# Patient Record
Sex: Female | Born: 1940 | Race: Black or African American | Hispanic: No | Marital: Single | Smoking: Never smoker
Health system: Southern US, Community
[De-identification: ages and names within clinical notes are randomized; demographics above are authoritative.]

## PROBLEM LIST (undated history)

## (undated) DIAGNOSIS — IMO0002 Reserved for concepts with insufficient information to code with codable children: Secondary | ICD-10-CM

## (undated) DIAGNOSIS — M1990 Unspecified osteoarthritis, unspecified site: Secondary | ICD-10-CM

## (undated) DIAGNOSIS — K219 Gastro-esophageal reflux disease without esophagitis: Secondary | ICD-10-CM

## (undated) DIAGNOSIS — K635 Polyp of colon: Secondary | ICD-10-CM

## (undated) DIAGNOSIS — F418 Other specified anxiety disorders: Secondary | ICD-10-CM

## (undated) DIAGNOSIS — I10 Essential (primary) hypertension: Secondary | ICD-10-CM

## (undated) DIAGNOSIS — D649 Anemia, unspecified: Secondary | ICD-10-CM

## (undated) DIAGNOSIS — E559 Vitamin D deficiency, unspecified: Secondary | ICD-10-CM

## (undated) DIAGNOSIS — F419 Anxiety disorder, unspecified: Secondary | ICD-10-CM

## (undated) DIAGNOSIS — F329 Major depressive disorder, single episode, unspecified: Secondary | ICD-10-CM

## (undated) DIAGNOSIS — F32A Depression, unspecified: Secondary | ICD-10-CM

## (undated) DIAGNOSIS — T7840XA Allergy, unspecified, initial encounter: Secondary | ICD-10-CM

## (undated) DIAGNOSIS — E785 Hyperlipidemia, unspecified: Secondary | ICD-10-CM

## (undated) DIAGNOSIS — D369 Benign neoplasm, unspecified site: Secondary | ICD-10-CM

## (undated) HISTORY — DX: Anemia, unspecified: D64.9

## (undated) HISTORY — DX: Unspecified osteoarthritis, unspecified site: M19.90

## (undated) HISTORY — DX: Reserved for concepts with insufficient information to code with codable children: IMO0002

## (undated) HISTORY — PX: OTHER SURGICAL HISTORY: SHX169

## (undated) HISTORY — DX: Allergy, unspecified, initial encounter: T78.40XA

## (undated) HISTORY — DX: Depression, unspecified: F32.A

## (undated) HISTORY — DX: Major depressive disorder, single episode, unspecified: F32.9

## (undated) HISTORY — DX: Polyp of colon: K63.5

## (undated) HISTORY — DX: Hyperlipidemia, unspecified: E78.5

## (undated) HISTORY — DX: Vitamin D deficiency, unspecified: E55.9

## (undated) HISTORY — DX: Benign neoplasm, unspecified site: D36.9

## (undated) HISTORY — DX: Essential (primary) hypertension: I10

## (undated) HISTORY — DX: Anxiety disorder, unspecified: F41.9

## (undated) HISTORY — DX: Gastro-esophageal reflux disease without esophagitis: K21.9

## (undated) HISTORY — DX: Other specified anxiety disorders: F41.8

---

## 1993-02-20 HISTORY — PX: ABDOMINAL HYSTERECTOMY: SHX81

## 1997-10-15 ENCOUNTER — Emergency Department (HOSPITAL_COMMUNITY): Admission: EM | Admit: 1997-10-15 | Discharge: 1997-10-15 | Payer: Self-pay | Admitting: Unknown Physician Specialty

## 1997-12-23 ENCOUNTER — Encounter: Payer: Self-pay | Admitting: Emergency Medicine

## 1997-12-23 ENCOUNTER — Emergency Department (HOSPITAL_COMMUNITY): Admission: EM | Admit: 1997-12-23 | Discharge: 1997-12-23 | Payer: Self-pay | Admitting: Emergency Medicine

## 1999-02-08 ENCOUNTER — Encounter: Payer: Self-pay | Admitting: Emergency Medicine

## 1999-02-08 ENCOUNTER — Inpatient Hospital Stay: Admission: EM | Admit: 1999-02-08 | Discharge: 1999-02-10 | Payer: Self-pay | Admitting: Emergency Medicine

## 2000-02-13 ENCOUNTER — Emergency Department (HOSPITAL_COMMUNITY): Admission: EM | Admit: 2000-02-13 | Discharge: 2000-02-14 | Payer: Self-pay | Admitting: Emergency Medicine

## 2000-04-05 ENCOUNTER — Other Ambulatory Visit: Admission: RE | Admit: 2000-04-05 | Discharge: 2000-04-05 | Payer: Self-pay | Admitting: *Deleted

## 2000-04-16 ENCOUNTER — Ambulatory Visit (HOSPITAL_COMMUNITY): Admission: RE | Admit: 2000-04-16 | Discharge: 2000-04-16 | Payer: Self-pay | Admitting: *Deleted

## 2000-04-16 ENCOUNTER — Encounter (INDEPENDENT_AMBULATORY_CARE_PROVIDER_SITE_OTHER): Payer: Self-pay | Admitting: *Deleted

## 2000-05-02 ENCOUNTER — Ambulatory Visit (HOSPITAL_COMMUNITY): Admission: RE | Admit: 2000-05-02 | Discharge: 2000-05-02 | Payer: Self-pay | Admitting: *Deleted

## 2000-05-02 ENCOUNTER — Encounter: Payer: Self-pay | Admitting: *Deleted

## 2000-05-14 ENCOUNTER — Encounter: Payer: Self-pay | Admitting: *Deleted

## 2000-05-14 ENCOUNTER — Ambulatory Visit (HOSPITAL_COMMUNITY): Admission: RE | Admit: 2000-05-14 | Discharge: 2000-05-14 | Payer: Self-pay | Admitting: *Deleted

## 2000-11-07 ENCOUNTER — Emergency Department (HOSPITAL_COMMUNITY)
Admission: EM | Admit: 2000-11-07 | Discharge: 2000-11-07 | Payer: Self-pay | Admitting: Thoracic Surgery (Cardiothoracic Vascular Surgery)

## 2001-03-07 ENCOUNTER — Encounter: Payer: Self-pay | Admitting: Emergency Medicine

## 2001-03-07 ENCOUNTER — Emergency Department (HOSPITAL_COMMUNITY): Admission: EM | Admit: 2001-03-07 | Discharge: 2001-03-07 | Payer: Self-pay | Admitting: Emergency Medicine

## 2001-08-27 ENCOUNTER — Emergency Department (HOSPITAL_COMMUNITY): Admission: EM | Admit: 2001-08-27 | Discharge: 2001-08-27 | Payer: Self-pay | Admitting: Emergency Medicine

## 2001-08-27 ENCOUNTER — Encounter: Payer: Self-pay | Admitting: Emergency Medicine

## 2002-09-04 ENCOUNTER — Encounter: Payer: Self-pay | Admitting: Pulmonary Disease

## 2002-09-04 ENCOUNTER — Ambulatory Visit (HOSPITAL_COMMUNITY): Admission: RE | Admit: 2002-09-04 | Discharge: 2002-09-04 | Payer: Self-pay | Admitting: Pulmonary Disease

## 2002-12-10 ENCOUNTER — Emergency Department (HOSPITAL_COMMUNITY): Admission: EM | Admit: 2002-12-10 | Discharge: 2002-12-10 | Payer: Self-pay | Admitting: Emergency Medicine

## 2003-02-27 ENCOUNTER — Ambulatory Visit (HOSPITAL_BASED_OUTPATIENT_CLINIC_OR_DEPARTMENT_OTHER): Admission: RE | Admit: 2003-02-27 | Discharge: 2003-02-27 | Payer: Self-pay | Admitting: Orthopedic Surgery

## 2003-02-27 ENCOUNTER — Ambulatory Visit (HOSPITAL_COMMUNITY): Admission: RE | Admit: 2003-02-27 | Discharge: 2003-02-27 | Payer: Self-pay | Admitting: Orthopedic Surgery

## 2003-10-21 ENCOUNTER — Ambulatory Visit (HOSPITAL_COMMUNITY): Admission: RE | Admit: 2003-10-21 | Discharge: 2003-10-21 | Payer: Self-pay | Admitting: Family Medicine

## 2003-11-16 ENCOUNTER — Ambulatory Visit: Payer: Self-pay | Admitting: *Deleted

## 2003-11-16 ENCOUNTER — Ambulatory Visit: Payer: Self-pay | Admitting: Internal Medicine

## 2003-11-19 ENCOUNTER — Ambulatory Visit (HOSPITAL_COMMUNITY): Admission: RE | Admit: 2003-11-19 | Discharge: 2003-11-19 | Payer: Self-pay | Admitting: Internal Medicine

## 2003-12-14 ENCOUNTER — Ambulatory Visit: Payer: Self-pay | Admitting: Internal Medicine

## 2003-12-14 ENCOUNTER — Ambulatory Visit: Payer: Self-pay | Admitting: Family Medicine

## 2004-02-22 ENCOUNTER — Ambulatory Visit: Payer: Self-pay | Admitting: Internal Medicine

## 2004-03-09 ENCOUNTER — Ambulatory Visit: Payer: Self-pay | Admitting: Internal Medicine

## 2004-05-05 ENCOUNTER — Ambulatory Visit: Payer: Self-pay | Admitting: Internal Medicine

## 2004-05-16 ENCOUNTER — Emergency Department (HOSPITAL_COMMUNITY): Admission: EM | Admit: 2004-05-16 | Discharge: 2004-05-16 | Payer: Self-pay | Admitting: Emergency Medicine

## 2004-05-19 ENCOUNTER — Ambulatory Visit (HOSPITAL_COMMUNITY): Admission: RE | Admit: 2004-05-19 | Discharge: 2004-05-19 | Payer: Self-pay | Admitting: Internal Medicine

## 2004-07-26 ENCOUNTER — Ambulatory Visit: Payer: Self-pay | Admitting: Internal Medicine

## 2004-08-02 ENCOUNTER — Ambulatory Visit: Payer: Self-pay | Admitting: Internal Medicine

## 2004-08-16 ENCOUNTER — Ambulatory Visit: Payer: Self-pay | Admitting: Internal Medicine

## 2004-08-30 ENCOUNTER — Ambulatory Visit: Payer: Self-pay | Admitting: Internal Medicine

## 2004-10-14 ENCOUNTER — Ambulatory Visit: Payer: Self-pay | Admitting: Internal Medicine

## 2004-10-29 ENCOUNTER — Observation Stay (HOSPITAL_COMMUNITY): Admission: EM | Admit: 2004-10-29 | Discharge: 2004-10-30 | Payer: Self-pay | Admitting: Emergency Medicine

## 2005-01-25 ENCOUNTER — Encounter (INDEPENDENT_AMBULATORY_CARE_PROVIDER_SITE_OTHER): Payer: Self-pay | Admitting: Specialist

## 2005-01-25 ENCOUNTER — Encounter: Admission: RE | Admit: 2005-01-25 | Discharge: 2005-01-25 | Payer: Self-pay | Admitting: Psychiatry

## 2005-06-30 ENCOUNTER — Ambulatory Visit: Payer: Self-pay | Admitting: Internal Medicine

## 2005-07-18 ENCOUNTER — Ambulatory Visit: Payer: Self-pay | Admitting: Internal Medicine

## 2005-09-04 ENCOUNTER — Emergency Department (HOSPITAL_COMMUNITY): Admission: EM | Admit: 2005-09-04 | Discharge: 2005-09-04 | Payer: Self-pay | Admitting: Family Medicine

## 2005-09-15 ENCOUNTER — Emergency Department (HOSPITAL_COMMUNITY): Admission: EM | Admit: 2005-09-15 | Discharge: 2005-09-15 | Payer: Self-pay | Admitting: Emergency Medicine

## 2005-09-27 ENCOUNTER — Ambulatory Visit: Payer: Self-pay | Admitting: Internal Medicine

## 2005-10-04 ENCOUNTER — Ambulatory Visit: Payer: Self-pay | Admitting: Internal Medicine

## 2005-10-04 ENCOUNTER — Inpatient Hospital Stay (HOSPITAL_COMMUNITY): Admission: AD | Admit: 2005-10-04 | Discharge: 2005-10-06 | Payer: Self-pay | Admitting: Internal Medicine

## 2005-10-04 ENCOUNTER — Encounter: Admission: RE | Admit: 2005-10-04 | Discharge: 2005-10-04 | Payer: Self-pay | Admitting: Internal Medicine

## 2005-10-04 ENCOUNTER — Ambulatory Visit (HOSPITAL_COMMUNITY): Admission: RE | Admit: 2005-10-04 | Discharge: 2005-10-04 | Payer: Self-pay | Admitting: Internal Medicine

## 2005-10-04 ENCOUNTER — Encounter (INDEPENDENT_AMBULATORY_CARE_PROVIDER_SITE_OTHER): Payer: Self-pay | Admitting: *Deleted

## 2005-10-04 LAB — CONVERTED CEMR LAB: Urinalysis: NORMAL

## 2006-02-06 ENCOUNTER — Encounter (INDEPENDENT_AMBULATORY_CARE_PROVIDER_SITE_OTHER): Payer: Self-pay | Admitting: *Deleted

## 2006-02-06 ENCOUNTER — Ambulatory Visit: Payer: Self-pay | Admitting: *Deleted

## 2006-02-06 DIAGNOSIS — E785 Hyperlipidemia, unspecified: Secondary | ICD-10-CM | POA: Insufficient documentation

## 2006-02-06 DIAGNOSIS — I10 Essential (primary) hypertension: Secondary | ICD-10-CM | POA: Insufficient documentation

## 2006-02-06 DIAGNOSIS — F410 Panic disorder [episodic paroxysmal anxiety] without agoraphobia: Secondary | ICD-10-CM | POA: Insufficient documentation

## 2006-02-06 LAB — CONVERTED CEMR LAB
BUN: 19 mg/dL (ref 6–23)
CO2: 23 meq/L (ref 19–32)
Calcium: 8.9 mg/dL (ref 8.4–10.5)
Chloride: 107 meq/L (ref 96–112)
Cholesterol: 203 mg/dL — ABNORMAL HIGH (ref 0–200)
Creatinine, Ser: 0.82 mg/dL (ref 0.40–1.20)
Glucose, Bld: 96 mg/dL (ref 70–99)
HDL: 58 mg/dL (ref >39)
LDL Cholesterol: 122 mg/dL — ABNORMAL HIGH (ref 0–99)
Potassium: 3.8 meq/L (ref 3.5–5.3)
Sodium: 141 meq/L (ref 135–145)
Total CHOL/HDL Ratio: 3.5
Triglycerides: 113 mg/dL (ref <150)
VLDL: 23 mg/dL (ref 0–40)

## 2006-02-09 ENCOUNTER — Encounter (INDEPENDENT_AMBULATORY_CARE_PROVIDER_SITE_OTHER): Payer: Self-pay | Admitting: *Deleted

## 2006-02-09 ENCOUNTER — Ambulatory Visit: Payer: Self-pay | Admitting: Internal Medicine

## 2006-02-09 LAB — CONVERTED CEMR LAB
BUN: 14 mg/dL (ref 6–23)
CO2: 27 meq/L (ref 19–32)
Calcium: 8.8 mg/dL (ref 8.4–10.5)
Chloride: 109 meq/L (ref 96–112)
Creatinine, Ser: 0.8 mg/dL (ref 0.40–1.20)
Glucose, Bld: 114 mg/dL — ABNORMAL HIGH (ref 70–99)
Potassium: 4.1 meq/L (ref 3.5–5.3)
Sodium: 140 meq/L (ref 135–145)

## 2006-03-09 DIAGNOSIS — M1990 Unspecified osteoarthritis, unspecified site: Secondary | ICD-10-CM | POA: Insufficient documentation

## 2006-03-09 DIAGNOSIS — K219 Gastro-esophageal reflux disease without esophagitis: Secondary | ICD-10-CM | POA: Insufficient documentation

## 2006-03-09 DIAGNOSIS — F3289 Other specified depressive episodes: Secondary | ICD-10-CM | POA: Insufficient documentation

## 2006-03-09 DIAGNOSIS — F329 Major depressive disorder, single episode, unspecified: Secondary | ICD-10-CM | POA: Insufficient documentation

## 2006-03-23 ENCOUNTER — Encounter (INDEPENDENT_AMBULATORY_CARE_PROVIDER_SITE_OTHER): Payer: Self-pay | Admitting: *Deleted

## 2006-03-23 ENCOUNTER — Ambulatory Visit: Payer: Self-pay | Admitting: Internal Medicine

## 2006-04-02 ENCOUNTER — Ambulatory Visit: Payer: Self-pay | Admitting: Hospitalist

## 2006-04-02 ENCOUNTER — Encounter (INDEPENDENT_AMBULATORY_CARE_PROVIDER_SITE_OTHER): Payer: Self-pay | Admitting: *Deleted

## 2006-04-02 DIAGNOSIS — Z9189 Other specified personal risk factors, not elsewhere classified: Secondary | ICD-10-CM | POA: Insufficient documentation

## 2006-04-03 ENCOUNTER — Telehealth (INDEPENDENT_AMBULATORY_CARE_PROVIDER_SITE_OTHER): Payer: Self-pay | Admitting: *Deleted

## 2006-04-03 LAB — CONVERTED CEMR LAB
ALT: 14 U/L (ref 0–35)
AST: 18 U/L (ref 0–37)
Albumin: 4.1 g/dL (ref 3.5–5.2)
Alkaline Phosphatase: 71 U/L (ref 39–117)
BUN: 17 mg/dL (ref 6–23)
CO2: 25 meq/L (ref 19–32)
Calcium: 9 mg/dL (ref 8.4–10.5)
Chloride: 106 meq/L (ref 96–112)
Cholesterol: 220 mg/dL — ABNORMAL HIGH (ref 0–200)
Creatinine, Ser: 0.89 mg/dL (ref 0.40–1.20)
Glucose, Bld: 98 mg/dL (ref 70–99)
HDL: 60 mg/dL (ref >39)
LDL Cholesterol: 141 mg/dL — ABNORMAL HIGH (ref 0–99)
Potassium: 4.2 meq/L (ref 3.5–5.3)
Sodium: 140 meq/L (ref 135–145)
Total Bilirubin: 0.6 mg/dL (ref 0.3–1.2)
Total CHOL/HDL Ratio: 3.7
Total Protein: 8 g/dL (ref 6.0–8.3)
Triglycerides: 94 mg/dL (ref <150)
VLDL: 19 mg/dL (ref 0–40)

## 2006-04-04 ENCOUNTER — Telehealth (INDEPENDENT_AMBULATORY_CARE_PROVIDER_SITE_OTHER): Payer: Self-pay | Admitting: *Deleted

## 2006-05-07 ENCOUNTER — Ambulatory Visit: Payer: Self-pay | Admitting: Internal Medicine

## 2006-06-22 ENCOUNTER — Ambulatory Visit: Payer: Self-pay | Admitting: *Deleted

## 2006-06-22 ENCOUNTER — Encounter (INDEPENDENT_AMBULATORY_CARE_PROVIDER_SITE_OTHER): Payer: Self-pay | Admitting: *Deleted

## 2006-06-22 LAB — CONVERTED CEMR LAB
ALT: 18 U/L (ref 0–35)
AST: 18 U/L (ref 0–37)
Albumin: 3.9 g/dL (ref 3.5–5.2)
Alkaline Phosphatase: 67 U/L (ref 39–117)
BUN: 18 mg/dL (ref 6–23)
CO2: 26 meq/L (ref 19–32)
Calcium: 8.9 mg/dL (ref 8.4–10.5)
Chloride: 108 meq/L (ref 96–112)
Cholesterol: 196 mg/dL (ref 0–200)
Creatinine, Ser: 0.93 mg/dL (ref 0.40–1.20)
Glucose, Bld: 102 mg/dL — ABNORMAL HIGH (ref 70–99)
HDL: 59 mg/dL (ref >39)
LDL Cholesterol: 122 mg/dL — ABNORMAL HIGH (ref 0–99)
Potassium: 4.2 meq/L (ref 3.5–5.3)
Sodium: 143 meq/L (ref 135–145)
Total Bilirubin: 0.5 mg/dL (ref 0.3–1.2)
Total CHOL/HDL Ratio: 3.3
Total Protein: 7.7 g/dL (ref 6.0–8.3)
Triglycerides: 76 mg/dL (ref <150)
VLDL: 15 mg/dL (ref 0–40)

## 2006-06-29 ENCOUNTER — Telehealth: Payer: Self-pay | Admitting: *Deleted

## 2006-07-12 ENCOUNTER — Ambulatory Visit: Payer: Self-pay | Admitting: Internal Medicine

## 2006-07-27 ENCOUNTER — Ambulatory Visit: Payer: Self-pay | Admitting: Internal Medicine

## 2006-07-27 DIAGNOSIS — R7301 Impaired fasting glucose: Secondary | ICD-10-CM | POA: Insufficient documentation

## 2006-08-17 ENCOUNTER — Ambulatory Visit: Payer: Self-pay | Admitting: Gastroenterology

## 2006-08-29 ENCOUNTER — Ambulatory Visit: Payer: Self-pay | Admitting: Hospitalist

## 2006-08-29 ENCOUNTER — Encounter (INDEPENDENT_AMBULATORY_CARE_PROVIDER_SITE_OTHER): Payer: Self-pay | Admitting: *Deleted

## 2006-08-29 LAB — CONVERTED CEMR LAB
ALT: 13 U/L (ref 0–35)
AST: 18 units/L (ref 0–37)
Albumin: 4.1 g/dL (ref 3.5–5.2)
Alkaline Phosphatase: 78 U/L (ref 39–117)
BUN: 21 mg/dL (ref 6–23)
CO2: 25 meq/L (ref 19–32)
Calcium: 8.8 mg/dL (ref 8.4–10.5)
Chloride: 108 meq/L (ref 96–112)
Cholesterol: 191 mg/dL (ref 0–200)
Creatinine, Ser: 0.95 mg/dL (ref 0.40–1.20)
Glucose, Bld: 103 mg/dL — ABNORMAL HIGH (ref 70–99)
HDL: 60 mg/dL (ref >39)
LDL Cholesterol: 111 mg/dL — ABNORMAL HIGH (ref 0–99)
Potassium: 4.3 meq/L (ref 3.5–5.3)
Sodium: 142 meq/L (ref 135–145)
Total Bilirubin: 0.4 mg/dL (ref 0.3–1.2)
Total CHOL/HDL Ratio: 3.2
Total Protein: 7.7 g/dL (ref 6.0–8.3)
Triglycerides: 100 mg/dL (ref <150)
VLDL: 20 mg/dL (ref 0–40)

## 2006-09-17 ENCOUNTER — Ambulatory Visit: Payer: Self-pay | Admitting: Hospitalist

## 2006-10-03 ENCOUNTER — Ambulatory Visit: Payer: Self-pay | Admitting: Gastroenterology

## 2006-10-03 ENCOUNTER — Encounter: Payer: Self-pay | Admitting: Gastroenterology

## 2006-12-13 ENCOUNTER — Telehealth (INDEPENDENT_AMBULATORY_CARE_PROVIDER_SITE_OTHER): Payer: Self-pay | Admitting: *Deleted

## 2006-12-27 ENCOUNTER — Encounter (INDEPENDENT_AMBULATORY_CARE_PROVIDER_SITE_OTHER): Payer: Self-pay | Admitting: *Deleted

## 2006-12-28 ENCOUNTER — Ambulatory Visit: Payer: Self-pay | Admitting: *Deleted

## 2007-02-01 ENCOUNTER — Ambulatory Visit: Payer: Self-pay | Admitting: Internal Medicine

## 2007-03-01 ENCOUNTER — Encounter (INDEPENDENT_AMBULATORY_CARE_PROVIDER_SITE_OTHER): Payer: Self-pay | Admitting: *Deleted

## 2007-03-01 ENCOUNTER — Ambulatory Visit: Payer: Self-pay | Admitting: Internal Medicine

## 2007-03-27 LAB — CONVERTED CEMR LAB
ALT: 14 U/L (ref 0–35)
AST: 18 U/L (ref 0–37)
Albumin: 4 g/dL (ref 3.5–5.2)
Alkaline Phosphatase: 75 units/L (ref 39–117)
BUN: 19 mg/dL (ref 6–23)
CO2: 25 meq/L (ref 19–32)
Calcium: 8.9 mg/dL (ref 8.4–10.5)
Chloride: 107 meq/L (ref 96–112)
Cholesterol: 197 mg/dL (ref 0–200)
Creatinine, Ser: 1.04 mg/dL (ref 0.40–1.20)
Glucose, Bld: 111 mg/dL — ABNORMAL HIGH (ref 70–99)
HDL: 63 mg/dL (ref >39)
LDL Cholesterol: 121 mg/dL — ABNORMAL HIGH (ref 0–99)
Potassium: 4.5 meq/L (ref 3.5–5.3)
Sodium: 141 meq/L (ref 135–145)
Total Bilirubin: 0.6 mg/dL (ref 0.3–1.2)
Total CHOL/HDL Ratio: 3.1
Total Protein: 8 g/dL (ref 6.0–8.3)
Triglycerides: 66 mg/dL (ref <150)
VLDL: 13 mg/dL (ref 0–40)

## 2007-04-08 ENCOUNTER — Ambulatory Visit: Payer: Self-pay | Admitting: Internal Medicine

## 2007-05-20 ENCOUNTER — Ambulatory Visit: Payer: Self-pay | Admitting: Internal Medicine

## 2007-05-20 ENCOUNTER — Encounter (INDEPENDENT_AMBULATORY_CARE_PROVIDER_SITE_OTHER): Payer: Self-pay | Admitting: Internal Medicine

## 2007-05-20 LAB — CONVERTED CEMR LAB
ALT: 15 U/L
AST: 18 U/L
Albumin: 4 g/dL
Alkaline Phosphatase: 77 U/L
BUN: 21 mg/dL
CO2: 22 meq/L
Calcium: 8.7 mg/dL
Chloride: 105 meq/L
Creatinine, Ser: 1.04 mg/dL
Glucose, Bld: 112 mg/dL — ABNORMAL HIGH
Potassium: 4.4 meq/L
Sodium: 140 meq/L
Total Bilirubin: 0.5 mg/dL
Total Protein: 7.9 g/dL

## 2007-09-03 ENCOUNTER — Ambulatory Visit: Payer: Self-pay | Admitting: *Deleted

## 2007-09-11 ENCOUNTER — Ambulatory Visit: Payer: Self-pay | Admitting: Infectious Diseases

## 2007-09-11 ENCOUNTER — Encounter (INDEPENDENT_AMBULATORY_CARE_PROVIDER_SITE_OTHER): Payer: Self-pay | Admitting: *Deleted

## 2007-09-11 ENCOUNTER — Encounter: Admission: RE | Admit: 2007-09-11 | Discharge: 2007-09-11 | Payer: Self-pay | Admitting: *Deleted

## 2007-09-12 LAB — CONVERTED CEMR LAB
Cholesterol: 219 mg/dL — ABNORMAL HIGH (ref 0–200)
HDL: 58 mg/dL (ref >39)
LDL Cholesterol: 146 mg/dL — ABNORMAL HIGH (ref 0–99)
Total CHOL/HDL Ratio: 3.8
Triglycerides: 73 mg/dL (ref <150)
VLDL: 15 mg/dL (ref 0–40)

## 2007-12-09 ENCOUNTER — Ambulatory Visit: Payer: Self-pay | Admitting: Internal Medicine

## 2007-12-09 ENCOUNTER — Encounter (INDEPENDENT_AMBULATORY_CARE_PROVIDER_SITE_OTHER): Payer: Self-pay | Admitting: *Deleted

## 2007-12-12 LAB — CONVERTED CEMR LAB
ALT: 17 U/L (ref 0–35)
AST: 19 U/L (ref 0–37)
Albumin: 3.9 g/dL (ref 3.5–5.2)
Alkaline Phosphatase: 80 units/L (ref 39–117)
BUN: 17 mg/dL (ref 6–23)
CO2: 23 meq/L (ref 19–32)
Calcium: 8.5 mg/dL (ref 8.4–10.5)
Chloride: 108 meq/L (ref 96–112)
Creatinine, Ser: 1 mg/dL (ref 0.40–1.20)
Glucose, Bld: 117 mg/dL — ABNORMAL HIGH (ref 70–99)
Potassium: 4.3 meq/L (ref 3.5–5.3)
Sed Rate: 31 mm/hr — ABNORMAL HIGH (ref 0–22)
Sodium: 142 meq/L (ref 135–145)
TSH: 0.792 microintl units/mL (ref 0.350–4.50)
Total Bilirubin: 0.4 mg/dL (ref 0.3–1.2)
Total CK: 387 U/L — ABNORMAL HIGH (ref 7–177)
Total Protein: 7.7 g/dL (ref 6.0–8.3)

## 2007-12-27 ENCOUNTER — Ambulatory Visit: Payer: Self-pay | Admitting: Infectious Diseases

## 2007-12-27 ENCOUNTER — Encounter (INDEPENDENT_AMBULATORY_CARE_PROVIDER_SITE_OTHER): Payer: Self-pay | Admitting: *Deleted

## 2007-12-27 LAB — CONVERTED CEMR LAB: Total CK: 392 units/L — ABNORMAL HIGH (ref 7–177)

## 2008-01-31 ENCOUNTER — Ambulatory Visit: Payer: Self-pay | Admitting: Gastroenterology

## 2008-01-31 ENCOUNTER — Encounter (INDEPENDENT_AMBULATORY_CARE_PROVIDER_SITE_OTHER): Payer: Self-pay | Admitting: *Deleted

## 2008-02-04 DIAGNOSIS — D649 Anemia, unspecified: Secondary | ICD-10-CM | POA: Insufficient documentation

## 2008-02-04 LAB — CONVERTED CEMR LAB
Basophils Absolute: 0 10*3/uL (ref 0.0–0.1)
Basophils Relative: 0.5 % (ref 0.0–3.0)
Eosinophils Absolute: 0 10*3/uL (ref 0.0–0.7)
Eosinophils Relative: 1.7 % (ref 0.0–5.0)
HCT: 35.3 % — ABNORMAL LOW (ref 36.0–46.0)
Hemoglobin: 11.9 g/dL — ABNORMAL LOW (ref 12.0–15.0)
Lymphocytes Relative: 40.9 % (ref 12.0–46.0)
MCHC: 33.7 g/dL (ref 30.0–36.0)
MCV: 95.5 fL (ref 78.0–100.0)
Monocytes Absolute: 0.1 10*3/uL (ref 0.1–1.0)
Monocytes Relative: 5.8 % (ref 3.0–12.0)
Neutro Abs: 1.3 10*3/uL — ABNORMAL LOW (ref 1.4–7.7)
Neutrophils Relative %: 51.1 % (ref 43.0–77.0)
Platelets: 199 10*3/uL (ref 150–400)
RBC: 3.7 M/uL — ABNORMAL LOW (ref 3.87–5.11)
RDW: 13.6 % (ref 11.5–14.6)
WBC: 2.4 10*3/uL — ABNORMAL LOW (ref 4.5–10.5)

## 2008-02-07 ENCOUNTER — Ambulatory Visit: Payer: Self-pay | Admitting: Internal Medicine

## 2008-02-07 ENCOUNTER — Encounter (INDEPENDENT_AMBULATORY_CARE_PROVIDER_SITE_OTHER): Payer: Self-pay | Admitting: *Deleted

## 2008-02-07 LAB — CONVERTED CEMR LAB
Basophils Absolute: 0 10*3/uL (ref 0.0–0.1)
Basophils Relative: 0 % (ref 0–1)
Eosinophils Absolute: 0 10*3/uL (ref 0.0–0.7)
Eosinophils Relative: 0 % (ref 0–5)
HCT: 35.9 % — ABNORMAL LOW (ref 36.0–46.0)
Hemoglobin: 11.9 g/dL — ABNORMAL LOW (ref 12.0–15.0)
Lymphocytes Relative: 9 % — ABNORMAL LOW (ref 12–46)
Lymphs Abs: 0.5 10*3/uL — ABNORMAL LOW (ref 0.7–4.0)
MCHC: 33.2 g/dL (ref 30.0–36.0)
MCV: 94.9 fL (ref 78.0–100.0)
Monocytes Absolute: 0.3 10*3/uL (ref 0.1–1.0)
Monocytes Relative: 5 % (ref 3–12)
Neutro Abs: 4.6 10*3/uL (ref 1.7–7.7)
Neutrophils Relative %: 86 % — ABNORMAL HIGH (ref 43–77)
Platelets: 201 10*3/uL (ref 150–400)
RBC Folate: 702 ng/mL — ABNORMAL HIGH (ref 180–600)
RBC: 3.78 M/uL — ABNORMAL LOW (ref 3.87–5.11)
RDW: 14.8 % (ref 11.5–15.5)
WBC: 5.3 10*3/uL (ref 4.0–10.5)

## 2008-02-17 LAB — CONVERTED CEMR LAB
Ferritin: 31 ng/mL (ref 10–291)
Iron: 29 ug/dL — ABNORMAL LOW (ref 42–145)
LDH: 230 units/L (ref 94–250)
Saturation Ratios: 10 % — ABNORMAL LOW (ref 20–55)
TIBC: 283 ug/dL (ref 250–470)
TSH: 0.454 microintl units/mL (ref 0.350–4.50)
UIBC: 254 ug/dL
Vitamin B-12: 516 pg/mL (ref 211–911)

## 2008-02-28 ENCOUNTER — Telehealth: Payer: Self-pay | Admitting: *Deleted

## 2008-03-03 ENCOUNTER — Ambulatory Visit: Payer: Self-pay | Admitting: Gastroenterology

## 2008-04-01 ENCOUNTER — Ambulatory Visit: Payer: Self-pay | Admitting: Gastroenterology

## 2008-06-03 ENCOUNTER — Ambulatory Visit (HOSPITAL_COMMUNITY): Admission: RE | Admit: 2008-06-03 | Discharge: 2008-06-03 | Payer: Self-pay | Admitting: Internal Medicine

## 2008-06-03 ENCOUNTER — Ambulatory Visit: Payer: Self-pay | Admitting: Internal Medicine

## 2008-06-03 ENCOUNTER — Encounter (INDEPENDENT_AMBULATORY_CARE_PROVIDER_SITE_OTHER): Payer: Self-pay | Admitting: *Deleted

## 2008-06-03 DIAGNOSIS — H9319 Tinnitus, unspecified ear: Secondary | ICD-10-CM | POA: Insufficient documentation

## 2008-07-01 ENCOUNTER — Encounter (INDEPENDENT_AMBULATORY_CARE_PROVIDER_SITE_OTHER): Payer: Self-pay | Admitting: *Deleted

## 2008-07-29 ENCOUNTER — Ambulatory Visit: Payer: Self-pay | Admitting: Internal Medicine

## 2008-08-05 LAB — CONVERTED CEMR LAB
ALT: 17 U/L (ref 0–35)
AST: 21 units/L (ref 0–37)
Albumin: 3.9 g/dL (ref 3.5–5.2)
Alkaline Phosphatase: 97 units/L (ref 39–117)
BUN: 15 mg/dL (ref 6–23)
Basophils Absolute: 0 10*3/uL (ref 0.0–0.1)
Basophils Relative: 0 % (ref 0–1)
CO2: 23 meq/L (ref 19–32)
Calcium: 8.7 mg/dL (ref 8.4–10.5)
Chloride: 107 meq/L (ref 96–112)
Creatinine, Ser: 0.93 mg/dL (ref 0.40–1.20)
Eosinophils Absolute: 0.1 10*3/uL (ref 0.0–0.7)
Eosinophils Relative: 3 % (ref 0–5)
GFR calc Af Amer: >60 mL/min (ref >60)
GFR calc non Af Amer: 60 mL/min — ABNORMAL LOW (ref >60)
Glucose, Bld: 102 mg/dL — ABNORMAL HIGH (ref 70–99)
HCT: 38.4 % (ref 36.0–46.0)
Hemoglobin: 12.3 g/dL (ref 12.0–15.0)
Lymphocytes Relative: 51 % — ABNORMAL HIGH (ref 12–46)
Lymphs Abs: 1.3 10*3/uL (ref 0.7–4.0)
MCHC: 32 g/dL (ref 30.0–36.0)
MCV: 96.2 fL (ref 78.0–100.0)
Monocytes Absolute: 0.2 10*3/uL (ref 0.1–1.0)
Monocytes Relative: 8 % (ref 3–12)
Neutro Abs: 1 10*3/uL — ABNORMAL LOW (ref 1.7–7.7)
Neutrophils Relative %: 39 % — ABNORMAL LOW (ref 43–77)
Platelets: 208 10*3/uL (ref 150–400)
Potassium: 4.2 meq/L (ref 3.5–5.3)
RBC: 3.99 M/uL (ref 3.87–5.11)
RDW: 14.5 % (ref 11.5–15.5)
Sed Rate: 30 mm/h — ABNORMAL HIGH (ref 0–22)
Sodium: 140 meq/L (ref 135–145)
TSH: 0.906 microintl units/mL (ref 0.350–4.500)
Total Bilirubin: 0.4 mg/dL (ref 0.3–1.2)
Total CK: 333 units/L — ABNORMAL HIGH (ref 7–177)
Total Protein: 7.8 g/dL (ref 6.0–8.3)
Vit D, 25-Hydroxy: 21 ng/mL — ABNORMAL LOW (ref 30–89)
WBC: 2.6 10*3/uL — ABNORMAL LOW (ref 4.0–10.5)

## 2008-08-10 ENCOUNTER — Ambulatory Visit: Payer: Self-pay | Admitting: Internal Medicine

## 2008-08-10 ENCOUNTER — Encounter (INDEPENDENT_AMBULATORY_CARE_PROVIDER_SITE_OTHER): Payer: Self-pay | Admitting: *Deleted

## 2008-08-10 DIAGNOSIS — E559 Vitamin D deficiency, unspecified: Secondary | ICD-10-CM | POA: Insufficient documentation

## 2008-08-11 LAB — CONVERTED CEMR LAB
Sed Rate: 28 mm/hr — ABNORMAL HIGH (ref 0–22)
Total CK: 425 units/L — ABNORMAL HIGH (ref 7–177)

## 2008-08-18 ENCOUNTER — Encounter: Admission: RE | Admit: 2008-08-18 | Discharge: 2008-08-18 | Payer: Self-pay | Admitting: Otolaryngology

## 2008-09-04 ENCOUNTER — Telehealth: Payer: Self-pay | Admitting: *Deleted

## 2008-09-09 ENCOUNTER — Ambulatory Visit (HOSPITAL_COMMUNITY): Admission: RE | Admit: 2008-09-09 | Discharge: 2008-09-09 | Payer: Self-pay | Admitting: Internal Medicine

## 2008-09-09 ENCOUNTER — Ambulatory Visit: Payer: Self-pay | Admitting: Internal Medicine

## 2008-11-23 ENCOUNTER — Ambulatory Visit (HOSPITAL_COMMUNITY): Admission: RE | Admit: 2008-11-23 | Discharge: 2008-11-23 | Payer: Self-pay | Admitting: Internal Medicine

## 2008-11-23 ENCOUNTER — Ambulatory Visit: Payer: Self-pay | Admitting: Internal Medicine

## 2008-11-24 LAB — CONVERTED CEMR LAB
Cholesterol: 247 mg/dL — ABNORMAL HIGH (ref 0–200)
HCT: 39.7 % (ref 36.0–46.0)
HDL: 67 mg/dL (ref >39)
Hemoglobin: 12.7 g/dL (ref 12.0–15.0)
LDL Cholesterol: 164 mg/dL — ABNORMAL HIGH (ref 0–99)
MCHC: 32 g/dL (ref 30.0–36.0)
MCV: 96.4 fL (ref >=78.0)
Platelets: 219 10*3/uL (ref 150–400)
RBC: 4.12 M/uL (ref 3.87–5.11)
RDW: 14 % (ref 11.5–15.5)
TSH: 1.232 microintl units/mL (ref 0.350–4.5)
Total CHOL/HDL Ratio: 3.7
Triglycerides: 79 mg/dL (ref <150)
VLDL: 16 mg/dL (ref 0–40)
WBC: 2.9 10*3/uL — ABNORMAL LOW (ref 4.0–10.5)

## 2009-01-18 ENCOUNTER — Ambulatory Visit: Payer: Self-pay | Admitting: Internal Medicine

## 2009-04-14 ENCOUNTER — Ambulatory Visit: Payer: Self-pay | Admitting: Internal Medicine

## 2009-04-23 LAB — CONVERTED CEMR LAB
ALT: 16 units/L (ref 0–35)
AST: 18 U/L (ref 0–37)
Albumin: 4.1 g/dL (ref 3.5–5.2)
Alkaline Phosphatase: 83 U/L (ref 39–117)
BUN: 14 mg/dL (ref 6–23)
CO2: 22 meq/L (ref 19–32)
Calcium: 8.7 mg/dL (ref 8.4–10.5)
Chloride: 104 meq/L (ref 96–112)
Cholesterol: 239 mg/dL — ABNORMAL HIGH (ref 0–200)
Creatinine, Ser: 0.84 mg/dL (ref 0.40–1.20)
Glucose, Bld: 106 mg/dL — ABNORMAL HIGH (ref 70–99)
HDL: 63 mg/dL (ref >39)
LDL Cholesterol: 159 mg/dL — ABNORMAL HIGH (ref 0–99)
Potassium: 4.4 meq/L (ref 3.5–5.3)
Sodium: 139 meq/L (ref 135–145)
Total Bilirubin: 0.4 mg/dL (ref 0.3–1.2)
Total CHOL/HDL Ratio: 3.8
Total Protein: 7.9 g/dL (ref 6.0–8.3)
Triglycerides: 84 mg/dL (ref <150)
VLDL: 17 mg/dL (ref 0–40)

## 2009-04-28 ENCOUNTER — Telehealth (INDEPENDENT_AMBULATORY_CARE_PROVIDER_SITE_OTHER): Payer: Self-pay | Admitting: Internal Medicine

## 2009-05-07 ENCOUNTER — Encounter: Admission: RE | Admit: 2009-05-07 | Discharge: 2009-05-07 | Payer: Self-pay | Admitting: Internal Medicine

## 2009-08-02 ENCOUNTER — Ambulatory Visit: Payer: Self-pay | Admitting: Internal Medicine

## 2009-09-20 ENCOUNTER — Encounter: Payer: Self-pay | Admitting: Internal Medicine

## 2009-09-20 ENCOUNTER — Ambulatory Visit (HOSPITAL_COMMUNITY): Admission: RE | Admit: 2009-09-20 | Discharge: 2009-09-20 | Payer: Self-pay | Admitting: Internal Medicine

## 2009-09-20 ENCOUNTER — Ambulatory Visit: Payer: Self-pay | Admitting: Internal Medicine

## 2009-10-04 ENCOUNTER — Ambulatory Visit: Payer: Self-pay | Admitting: Internal Medicine

## 2009-10-04 LAB — CONVERTED CEMR LAB
ALT: 14 U/L (ref 0–35)
AST: 20 U/L (ref 0–37)
Albumin: 4.1 g/dL (ref 3.5–5.2)
Alkaline Phosphatase: 74 U/L (ref 39–117)
BUN: 19 mg/dL (ref 6–23)
Basophils Absolute: 0 10*3/uL (ref 0.0–0.1)
Basophils Relative: 0 % (ref 0–1)
CO2: 26 meq/L (ref 19–32)
Calcium: 8.9 mg/dL (ref 8.4–10.5)
Chloride: 107 meq/L (ref 96–112)
Creatinine, Ser: 0.96 mg/dL (ref 0.40–1.20)
Eosinophils Absolute: 0.1 10*3/uL (ref 0.0–0.7)
Eosinophils Relative: 3 % (ref 0–5)
Ferritin: 58 ng/mL (ref 10–291)
Free T4: 1.04 ng/dL (ref 0.80–1.80)
Glucose, Bld: 109 mg/dL — ABNORMAL HIGH (ref 70–99)
HCT: 36.4 % (ref 36.0–46.0)
Hemoglobin: 11.8 g/dL — ABNORMAL LOW (ref 12.0–15.0)
Iron: 77 ug/dL (ref 42–145)
Lymphocytes Relative: 52 % — ABNORMAL HIGH (ref 12–46)
Lymphs Abs: 1.4 10*3/uL (ref 0.7–4.0)
MCHC: 32.4 g/dL (ref 30.0–36.0)
MCV: 93.8 fL (ref >=78.0)
Monocytes Absolute: 0.2 10*3/uL (ref 0.1–1.0)
Monocytes Relative: 7 % (ref 3–12)
Neutro Abs: 1 10*3/uL — ABNORMAL LOW (ref 1.7–7.7)
Neutrophils Relative %: 37 % — ABNORMAL LOW (ref 43–77)
Platelets: 175 10*3/uL (ref 150–400)
Potassium: 4.2 meq/L (ref 3.5–5.3)
Pro B Natriuretic peptide (BNP): <30 pg/mL (ref 0.0–100.0)
RBC: 2.88 M/uL — ABNORMAL LOW (ref 3.87–5.11)
RDW: 13.8 % (ref 11.5–15.5)
Saturation Ratios: 29 % (ref 20–55)
Sodium: 140 meq/L (ref 135–145)
TIBC: 268 ug/dL (ref 250–470)
TSH: 1.273 microintl units/mL (ref 0.350–4.5)
Total Bilirubin: 0.5 mg/dL (ref 0.3–1.2)
Total Protein: 7.9 g/dL (ref 6.0–8.3)
UIBC: 191 ug/dL
WBC: 2.4 10*3/uL — ABNORMAL LOW (ref 4.0–10.5)

## 2009-10-15 ENCOUNTER — Ambulatory Visit: Payer: Self-pay | Admitting: Internal Medicine

## 2009-10-15 LAB — CONVERTED CEMR LAB
OCCULT 1: POSITIVE
OCCULT 2: NEGATIVE
OCCULT 3: NEGATIVE

## 2009-10-21 ENCOUNTER — Ambulatory Visit: Payer: Self-pay | Admitting: Cardiology

## 2009-10-21 DIAGNOSIS — R079 Chest pain, unspecified: Secondary | ICD-10-CM | POA: Insufficient documentation

## 2009-11-09 ENCOUNTER — Ambulatory Visit: Payer: Self-pay | Admitting: Cardiology

## 2009-11-09 ENCOUNTER — Encounter: Payer: Self-pay | Admitting: Cardiology

## 2009-11-09 ENCOUNTER — Ambulatory Visit: Payer: Self-pay

## 2009-11-09 ENCOUNTER — Ambulatory Visit (HOSPITAL_COMMUNITY): Admission: RE | Admit: 2009-11-09 | Discharge: 2009-11-09 | Payer: Self-pay | Admitting: Cardiology

## 2009-11-16 LAB — CONVERTED CEMR LAB
ALT: 18 U/L (ref 0–35)
AST: 22 U/L (ref 0–37)
Albumin: 3.6 g/dL (ref 3.5–5.2)
Alkaline Phosphatase: 69 units/L (ref 39–117)
Bilirubin, Direct: 0.1 mg/dL (ref 0.0–0.3)
Cholesterol: 206 mg/dL — ABNORMAL HIGH (ref 0–200)
Direct LDL: 119.5 mg/dL
HDL: 66.5 mg/dL (ref >39.00)
Total Bilirubin: 0.5 mg/dL (ref 0.3–1.2)
Total CHOL/HDL Ratio: 3
Total Protein: 7.5 g/dL (ref 6.0–8.3)
Triglycerides: 68 mg/dL (ref 0.0–149.0)
VLDL: 13.6 mg/dL (ref 0.0–40.0)

## 2009-11-30 ENCOUNTER — Telehealth: Payer: Self-pay | Admitting: Internal Medicine

## 2009-11-30 ENCOUNTER — Encounter: Payer: Self-pay | Admitting: Internal Medicine

## 2009-12-01 ENCOUNTER — Telehealth: Payer: Self-pay | Admitting: Internal Medicine

## 2009-12-03 ENCOUNTER — Ambulatory Visit: Payer: Self-pay | Admitting: Gastroenterology

## 2009-12-06 ENCOUNTER — Ambulatory Visit: Payer: Self-pay | Admitting: Internal Medicine

## 2010-01-11 ENCOUNTER — Ambulatory Visit: Payer: Self-pay | Admitting: Internal Medicine

## 2010-01-11 ENCOUNTER — Telehealth (INDEPENDENT_AMBULATORY_CARE_PROVIDER_SITE_OTHER): Payer: Self-pay | Admitting: *Deleted

## 2010-01-11 ENCOUNTER — Ambulatory Visit (HOSPITAL_COMMUNITY): Admission: RE | Admit: 2010-01-11 | Discharge: 2010-01-11 | Payer: Self-pay | Admitting: Internal Medicine

## 2010-01-11 ENCOUNTER — Encounter: Payer: Self-pay | Admitting: Internal Medicine

## 2010-01-11 LAB — CONVERTED CEMR LAB
Hgb A1c MFr Bld: 5.8 %
TSH: 1.231 microintl units/mL (ref 0.350–4.5)
Vit D, 25-Hydroxy: 29 ng/mL — ABNORMAL LOW (ref 30–89)

## 2010-01-26 ENCOUNTER — Emergency Department (HOSPITAL_COMMUNITY)
Admission: EM | Admit: 2010-01-26 | Discharge: 2010-01-26 | Payer: Self-pay | Source: Home / Self Care | Admitting: Emergency Medicine

## 2010-03-11 ENCOUNTER — Ambulatory Visit: Admission: RE | Admit: 2010-03-11 | Discharge: 2010-03-11 | Payer: Self-pay | Source: Home / Self Care

## 2010-03-18 ENCOUNTER — Ambulatory Visit (HOSPITAL_COMMUNITY)
Admission: RE | Admit: 2010-03-18 | Discharge: 2010-03-18 | Payer: Self-pay | Source: Home / Self Care | Attending: Internal Medicine | Admitting: Internal Medicine

## 2010-03-22 NOTE — Assessment & Plan Note (Signed)
Summary: CHECKUP/SB.   Vital Signs:  Patient profile:   70 year old female Height:      63 inches Weight:      177.6 pounds BMI:     31.65 Temp:     98.1 degrees F oral Pulse rate:   70 / minute BP sitting:   158 / 77  (right arm) Cuff size:   large  Vitals Entered By: Theotis Barrio NT II (July 29, 2008 11:01 AM) CC: COMPLAIN OF HEAD/BACK/LEGS/CHEST PAIN  ONGOING FOR ALMOST 1 MONTH/ PAIN LEVEL #6  / RASH ? FROM NEW MEDICATION IN MAY Is Patient Diabetic? No Pain Assessment Patient in pain? yes     Location: BACK/LEGS/HEAD/CHEST Intensity:    6 Type: aching Onset of pain  FOR ALMOST 1 MONTH  Have you ever been in a relationship where you felt threatened, hurt or afraid?No   Does patient need assistance? Functional Status Self care Ambulation Normal Comments COMPLAIN OF HEAD/BACK/LEGS/CHEST PAIN - ONGOING FOR ALMOST A MONTH/ ? RASH FROM NEW MEDICATION SHE STARTED ON IN MAY.   Primary Care Provider:  Rufina Falco, MD  CC:  COMPLAIN OF HEAD/BACK/LEGS/CHEST PAIN  ONGOING FOR ALMOST 1 MONTH/ PAIN LEVEL #6  / RASH ? FROM NEW MEDICATION IN MAY.  History of Present Illness: PT is a 70 year old woman with PMH as outlined in note who presents to clinic with multiple complaints.  She reports generalized body aches in her arms, legs, head, back, etc..  PT reports that she is waking up every 2 hours because she has to urinate.  She reports that she has been drinking lots of water.  Pt denies symptoms that would indicate a UTI.  She reports that she is under a lot of stress because her friend is in a coma.  She also did not get her MRI of her head as recommended by her ENT doctor because she did not feel like it.   On a good note she had minimal hearing loss and her tinnitus has resolved.  She also reports that since she started taking the ibuprofen she has noted a rash on her abdomen and her back, although pt has taken the ibuprofen in the past without difficulty.   She denies using new  soaps, laundry detergents, getting new clothes, or using new lotions or perfumes.  Pt is not sleeping well and is depressed.  Pt also complains of increasing exertional dyspnea without associated CP.  Her last cath was normal and was in 2007.  Pt reports increasing leg weakness and heaviness with activity as well as diffuse muscle pains.    Preventive Screening-Counseling & Management  Alcohol-Tobacco     Smoking Status: never  Caffeine-Diet-Exercise     Does Patient Exercise: yes     Type of exercise: bike - sometime; weighs     Times/week: 3  Problems Prior to Update: 1)  Unspecified Hearing Loss  (ICD-389.9) 2)  Ear Pain, Left  (ICD-388.70) 3)  Tinnitus, Left  (ICD-388.30) 4)  Fever Unspecified  (ICD-780.60) 5)  Weakness  (ICD-780.79) 6)  Other Decreased White Blood Cell Count  (ICD-288.59) 7)  Anemia  (ICD-285.9) 8)  Epigastric Discomfort  (ICD-789.06) 9)  Muscle Pain  (ICD-729.1) 10)  Back Pain, Acute  (ICD-724.5) 11)  Skin Lesion  (ICD-709.9) 12)  Impaired Fasting Glucose  (ICD-790.21) 13)  Screening For Malignant Neoplasm, Colon  (ICD-V76.51) 14)  Blurred Vision  (ICD-368.8) 15)  Mammogram, Abnormal, Hx of  (ICD-V15.9) 16)  Dizziness  (  ICD-780.4) 17)  Palpitations  (ICD-785.1) 18)  Osteoarthritis  (ICD-715.90) 19)  Depression  (ICD-311) 20)  Gerd  (ICD-530.81) 21)  Panic Disorder  (ICD-300.01) 22)  Constipation  (ICD-564.00) 23)  Unstable Angina  (ICD-411.1) 24)  Hypertension  (ICD-401.9) 25)  Hyperlipidemia  (ICD-272.4)  Medications Prior to Update: 1)  Norvasc 10 Mg Tabs (Amlodipine Besylate) .... Take 1 Tablet By Mouth Once A Day 2)  Aspirin 81 Mg Tbec (Aspirin) .... Take 1 Tablet By Mouth Once A Day 3)  Multivitamins  Tabs (Multiple Vitamin) .... Take 1 Tablet By Mouth Once A Day 4)  Dulcolax 5 Mg Tbec (Bisacodyl) .... Take 1 Tablet By Mouth Two Times A Day As Needed For Constipation 5)  Colace 100 Mg Caps (Docusate Sodium) .... Take 1 Capsule By Mouth Two Times  A Day 6)  Metamucil 30.9 % Powd (Psyllium) .... Take 1 Teaspoon Mixed in 8 Oz of Water Once Daily 7)  Pravastatin Sodium 40 Mg Tabs (Pravastatin Sodium) .... Take 2 Tablets By Mouth Once Daily 8)  Ferrous Sulfate 325 (65 Fe) Mg Tabs (Ferrous Sulfate) .... Take 1 Tablet By Mouth Two Times A Day 9)  Omeprazole 20 Mg  Cpdr (Omeprazole) .Marland Kitchen.. 1 Each Day 20-30 Min Prior To Breakfast Meal 10)  Amoxil 500 Mg Caps (Amoxicillin) .... Take 1 Tablet By Mouth Two Times A Day For 7 Days. 11)  Ibuprofen 400 Mg Tabs (Ibuprofen) .... Take 1 Tablet By Mouth Three Times A Day As Needed For Pain  Current Medications (verified): 1)  Norvasc 10 Mg Tabs (Amlodipine Besylate) .... Take 1 Tablet By Mouth Once A Day 2)  Aspirin 81 Mg Tbec (Aspirin) .... Take 1 Tablet By Mouth Once A Day 3)  Multivitamins  Tabs (Multiple Vitamin) .... Take 1 Tablet By Mouth Once A Day 4)  Dulcolax 5 Mg Tbec (Bisacodyl) .... Take 1 Tablet By Mouth Two Times A Day As Needed For Constipation 5)  Colace 100 Mg Caps (Docusate Sodium) .... Take 1 Capsule By Mouth Two Times A Day 6)  Metamucil 30.9 % Powd (Psyllium) .... Take 1 Teaspoon Mixed in 8 Oz of Water Once Daily 7)  Pravastatin Sodium 40 Mg Tabs (Pravastatin Sodium) .... Take 2 Tablets By Mouth Once Daily 8)  Ferrous Sulfate 325 (65 Fe) Mg Tabs (Ferrous Sulfate) .... Take 1 Tablet By Mouth Two Times A Day 9)  Omeprazole 20 Mg  Cpdr (Omeprazole) .Marland Kitchen.. 1 Each Day 20-30 Min Prior To Breakfast Meal 10)  Amoxil 500 Mg Caps (Amoxicillin) .... Take 1 Tablet By Mouth Two Times A Day For 7 Days. 11)  Ibuprofen 400 Mg Tabs (Ibuprofen) .... Take 1 Tablet By Mouth Three Times A Day As Needed For Pain 12)  Ambien 5 Mg Tabs (Zolpidem Tartrate) .... Take One Pill Each Night 13)  Amitriptyline Hcl 25 Mg Tabs (Amitriptyline Hcl) .... Take One Pill Each Night  Allergies (verified): No Known Drug Allergies  Past History:  Past Medical History: Last updated:  02/07/2008 Hyperlipidemia Hypertension Panic disorder Unstable angina  ---Cath 2007: LVEF 65%, Right dominant system with patent coronary arteries --- Holter monitor 2007: negative per clinic note Constipation GERD Depression Osteoarthritis  -- R knee meniscal repair 2005 palpitations Hx of dizziness Anemia blurred vision right thumb skin lesion:  ? ganglioma Bilateral cataracts. colonic tubular adenoma, August 2008, repeat colonoscopy August 2013  Past Surgical History: Last updated: 01/31/2008 Appendectomy  Family History: Last updated: 01/31/2008 no colon cancer  Social History: Last updated: 01/31/2008 does not  drink alcohol, does not drink caffeinated beverages.  Risk Factors: Exercise: yes (07/29/2008)  Risk Factors: Smoking Status: never (07/29/2008)  Review of Systems       SEE HPI  Physical Exam  General:  alert, well-developed, well-nourished, well-hydrated, and overweight-appearing.   Head:  normocephalic and atraumatic.   Mouth:  pharynx pink and moist.   Neck:  supple, no thyromegaly, no JVD, and no carotid bruits.   Lungs:  normal respiratory effort, no intercostal retractions, no accessory muscle use, normal breath sounds, no crackles, and no wheezes.   Heart:  normal rate, regular rhythm, no murmur, no gallop, no rub, and no JVD.   Abdomen:  soft, non-tender, and normal bowel sounds.   Msk:  normal ROM and no muscle atrophy.   Extremities:  No clubbing, cyanosis, edema, or deformity noted with normal full range of motion of all joints.   Neurologic:  alert & oriented X3, cranial nerves II-XII intact, strength normal in all extremities, sensation intact to light touch, gait normal, and DTRs symmetrical and normal.   Skin:  Pt with faint brownish-red small macules noted on her abdomen and back.  turgor normal, color normal, no ecchymoses, no petechiae, no purpura, no ulcerations, and no edema.   Psych:  Oriented X3, good eye contact, depressed  affect, flat affect, subdued, slightly anxious, judgment fair, and agitated.     Impression & Recommendations:  Problem # 1:  DYSPNEA ON EXERTION (ICD-786.09) Pt reports that she does not have time to get an EKG today.  Will get a 2 D echo to evaluate for EF, wall motion abnormality, and valvular abnormalties.  Pt may need formal PFTs depending on our findings.   Orders: 2 D Echo (2 D Echo)  Problem # 2:  MYALGIA (ICD-729.1) Pt with diffuse myalgias, depression, and poor sleep which seems to fit with fibromyalgia.  However she does not have trigger point tenderness.   Will check a TSH, CMET, total CK level, ESR, and a FLP.  Will continue her pravachol for the time being until her CK level comes back.  Given her leg weakness and her strange rash, will need to keep polymyositis and dermatomyositis in the differential depending on the lab findings.  Pt may need a L spine, ? EMG/Nerve conduction velocity study, or possibly a muscle biopsy.  This all depends on the results. Will start pt on Ambien which if is helps should help with the severity of her pains.  Will also start her on amitriptyline for her depression which may also help her pain component.  Pts old EKG did not show signs of QT prolongation, unfortunately pt could not stay to get an EKG.  Pt will stop the ibuprofen and try tylenol for her pain.  Her updated medication list for this problem includes:    Aspirin 81 Mg Tbec (Aspirin) .Marland Kitchen... Take 1 tablet by mouth once a day    Ibuprofen 400 Mg Tabs (Ibuprofen) .Marland Kitchen... Take 1 tablet by mouth three times a day as needed for pain  Problem # 3:  UNSPECIFIED HEARING LOSS (ICD-389.9) Pt to reschedule her MRI at my urging and per ENT request.   Problem # 4:  WEAKNESS (ICD-780.79) SEE Above.  Consider MRI of l spine. Orders: T-Sed Rate (Automated) [phone]) T-TSH [phone]) T-Vitamin D (25-Hydroxy) [id number]) T-CK Total [phone])  Problem # 5:  ANEMIA (ICD-285.9) Will check a CBC  and continue her ferrous sulfate.  Her updated medication list for this problem includes:    Ferrous  Sulfate 325 (65 Fe) Mg Tabs (Ferrous sulfate) .Marland Kitchen... Take 1 tablet by mouth two times a day  Orders: T-CBC w/Diff ([id number])  Problem # 6:  IMPAIRED FASTING GLUCOSE (ICD-790.21) Given her history of glucose intolerance and nocturia, fatigue will get a fasting glucose at follow up in two weeks.   Problem # 7:  DEPRESSION (ICD-311) Will start her on amitriptyline for reasons outlined above.  Pt to come back in two weeks to reassess her depression and to get an EKG.   Her updated medication list for this problem includes:    Amitriptyline Hcl 25 Mg Tabs (Amitriptyline hcl) .Marland Kitchen... Take one pill each night  Problem # 8:  HYPERTENSION (ICD-401.9) Pts blood pressure is elevated today. Pt reports that her blood pressure was good over the last few days so she did not take her norvasc this morning.  I have explained to her that her blood pressure was good because she was taking her medication and to make sure she takes it.  Will reassess at follow up .  Her updated medication list for this problem includes:    Norvasc 10 Mg Tabs (Amlodipine besylate) .Marland Kitchen... Take 1 tablet by mouth once a day  Orders: T-Comprehensive Metabolic Panel ([id number])Future Orders: T-Lipid Profile ([id number]) ... 08/12/2008  Problem # 9:  HYPERLIPIDEMIA (ICD-272.4) Will continue the pravachol for now pending the CMET and CK results.  Will check a FLP in two weeks.  Her updated medication list for this problem includes:    Pravastatin Sodium 40 Mg Tabs (Pravastatin sodium) .Marland Kitchen... Take 2 tablets by mouth once daily  Orders: T-Comprehensive Metabolic Panel ([id number])Future Orders: T-Lipid Profile ([id number]) ... 08/12/2008  Problem # 10:  INSOMNIA (ICD-780.52) Will try ambien to see if this helps her with her insomnia and with her pain.  Her updated medication list for this problem includes:    Ambien 5 Mg Tabs  (Zolpidem tartrate) .Marland Kitchen... Take one pill each night  Complete Medication List: 1)  Norvasc 10 Mg Tabs (Amlodipine besylate) .... Take 1 tablet by mouth once a day 2)  Aspirin 81 Mg Tbec (Aspirin) .... Take 1 tablet by mouth once a day 3)  Multivitamins Tabs (Multiple vitamin) .... Take 1 tablet by mouth once a day 4)  Dulcolax 5 Mg Tbec (Bisacodyl) .... Take 1 tablet by mouth two times a day as needed for constipation 5)  Colace 100 Mg Caps (Docusate sodium) .... Take 1 capsule by mouth two times a day 6)  Metamucil 30.9 % Powd (Psyllium) .... Take 1 teaspoon mixed in 8 oz of water once daily 7)  Pravastatin Sodium 40 Mg Tabs (Pravastatin sodium) .... Take 2 tablets by mouth once daily 8)  Ferrous Sulfate 325 (65 Fe) Mg Tabs (Ferrous sulfate) .... Take 1 tablet by mouth two times a day 9)  Omeprazole 20 Mg Cpdr (Omeprazole) .Marland Kitchen.. 1 each day 20-30 min prior to breakfast meal 10)  Amoxil 500 Mg Caps (Amoxicillin) .... Take 1 tablet by mouth two times a day for 7 days. 11)  Ibuprofen 400 Mg Tabs (Ibuprofen) .... Take 1 tablet by mouth three times a day as needed for pain 12)  Ambien 5 Mg Tabs (Zolpidem tartrate) .... Take one pill each night 13)  Amitriptyline Hcl 25 Mg Tabs (Amitriptyline hcl) .... Take one pill each night  Other Orders: Future Orders: T- Capillary Blood Glucose ([id number]) ... 08/12/2008  Patient Instructions: 1)  Please schedule a follow-up appointment in 2 weeks. 2)  Please reschedule your appointment for your  MRI of your head. 3)  I will call you with any abnormal lab values.  4)  When you come back in two weeks make sure you are fasting (nothing to eat or drink after midnight).  You  may take your pills with water. 5)  I am starting you on a medication called ambien.  It is a sleep medication.  Take one pill each night 6)  I am also starting amitriptyline 25mg .  Take one pill each night.  This is good for depression and may also help with general pains, and sleep.   7)  I  will call you if we need to change any of your medications.  8)  Stop the ibuprofen and see if the rash goes away.  You can use tylenol instead.   Prescriptions: AMITRIPTYLINE HCL 25 MG TABS (AMITRIPTYLINE HCL) Take one pill each night  #30 x 1   Entered and Authorized by:   Rufina Falco MD   Signed by:   Rufina Falco MD on 07/29/2008   Method used:   Print then Give to Patient   RxID:   [id number] AMBIEN 5 MG TABS (ZOLPIDEM TARTRATE) Take one pill each night  #30 x 2   Entered and Authorized by:   Rufina Falco MD   Signed by:   Rufina Falco MD on 07/29/2008   Method used:   Print then Give to Patient   RxID:   [id number]

## 2010-03-22 NOTE — Assessment & Plan Note (Signed)
Summary: upper back pain  [mkj]   Vital Signs:  Patient Profile:   70 Years Old Female Height:     63 inches (160.02 cm) Weight:      176.6 pounds (80.27 kg) BMI:     31.40 Temp:     97.1 degrees F (36.17 degrees C) oral Pulse rate:   688 / minute BP sitting:   161 / 78  (right arm)  Pt. in pain?   yes    Location:   back    Intensity:   8    Type:       sharp  Vitals Entered By: Filomena Jungling (May 20, 2007 9:11 AM)              Is Patient Diabetic? No Nutritional Status BMI of 25 - 29 = overweight  Have you ever been in a relationship where you felt threatened, hurt or afraid?No   Does patient need assistance? Ambulation Normal     Chief Complaint:  over a week/ back pain that radiates around to abdomen.  History of Present Illness: Brianna Pittman is a 70 yo lady with a PMH as outlined in the EMR. She came today for back pain. It's in the mid back region. This is over a week and is a new syptom. It is sharp pain. She noticed it first when she was walking. It's also present when she tries to stand up from sitting position. It doesn't last long. She also feels her heart is beating faster. Sometimes the pain comes around in front below the breasts. NO releiving factor. No radiation to the legs. No numbness in the legs. No stool or urinary incontinence. No h/o back trauma. 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 THe last time she lifted one was about 3 wks ago. No rashes, fever, chills.   She didn't take her BP pill this morning.     Current Allergies: No known allergies     Risk Factors: Tobacco use:  never Alcohol use:  no Exercise:  yes    Times per week:  3    Type:  bike Seatbelt use:  100 %   Review of Systems  General      Denies chills and fever.  CV      Complains of palpitations.      Denies chest pain or discomfort and shortness of breath with exertion.  Resp      Denies cough.  MS      Complains of mid back  pain.      Denies joint pain, joint redness, joint swelling, and muscle weakness.  Derm      Denies rash.  Neuro      Denies brief paralysis, disturbances in coordination, falling down, numbness, poor balance, tingling, and weakness.   Physical Exam  General:     alert.   Head:     normocephalic.   Mouth:     pharynx pink and moist.   Neck:     supple and no masses.   Chest Wall:     No rashes.  Lungs:     normal breath sounds, no crackles, and no wheezes.   Heart:     normal rate, regular rhythm, no murmur, no gallop, and no rub.   Abdomen:     soft, non-tender, normal bowel sounds, no distention, and no masses.   Msk:     Spinal exam: no rash, deformity. No tenderness. SLRT  negative on both sides. Pt. able to walk on toetips and on heel without any pain.  Extremities:     No pitting pedal edema.  Neurologic:     Strength of b.l lower extremities normal and symmetrical. DOrsiflexion of foot and great toe and planter flexion of the toe is normal and symmetrical. B/L light touch symmetrical and noramal. alert & oriented X3, gait normal, DTRs symmetrical and normal, toes down bilaterally on Babinski, and Romberg negative.   Cervical Nodes:     no anterior cervical adenopathy and no posterior cervical adenopathy.      Impression & Recommendations:  Problem # 1:  BACK PAIN, ACUTE (ICD-724.5) Musculoskeletal in origin. No neurological s/s. SLRT negative, no pain in the legs. I will treat it with a short course of flexeril and as needed ibuprofen.  Her updated medication list for this problem includes:    Aspirin 81 Mg Tbec (Aspirin) .Marland Kitchen... Take 1 tablet by mouth once a day    Flexeril 5 Mg Tabs (Cyclobenzaprine hcl) .Marland Kitchen... Take 1 pill by mouth three times a day for 3 wks.   Problem # 2:  MAMMOGRAM, ABNORMAL, HX OF (ICD-V15.9) Pt. promised to get mammogram appt. on next schedule.   Problem # 3:  HYPERTENSION (ICD-401.9) BP today 161/78 and last time 133/70. I think this is  elevated 2/2 pain, since her BP has been so well controlled on last few visits and she is taking all of her pills regularly. Will check CMET.  Her updated medication list for this problem includes:    Norvasc 10 Mg Tabs (Amlodipine besylate) .Marland Kitchen... Take 1 tablet by mouth once a day    Lisinopril 40 Mg Tabs (Lisinopril) .Marland Kitchen... Take 1 tablet by mouth once a day  Orders: T-Comprehensive Metabolic Panel ([id number])   Problem # 4:  HYPERLIPIDEMIA (ICD-272.4) Noted the recent increase of pravastatin, will check her CMET.  Her updated medication list for this problem includes:    Pravachol 40 Mg Tabs (Pravastatin sodium) .Marland Kitchen... 2 tabs by mouth qday  Orders: T-Comprehensive Metabolic Panel ([id number])   Complete Medication List: 1)  Norvasc 10 Mg Tabs (Amlodipine besylate) .... Take 1 tablet by mouth once a day 2)  Aspirin 81 Mg Tbec (Aspirin) .... Take 1 tablet by mouth once a day 3)  Colace 100 Mg Caps (Docusate sodium) .... Take 1 capsule by mouth two times a day 4)  Effexor 37.5 Mg Tabs (Venlafaxine hcl) .... Take 1 tablet by mouth once a day 5)  Lisinopril 40 Mg Tabs (Lisinopril) .... Take 1 tablet by mouth once a day 6)  Dulcolax 5 Mg Tbec (Bisacodyl) .... Take 1 tablet by mouth two times a day as needed for constipation 7)  Metamucil 30.9 % Powd (Psyllium) .... Take 1 teaspoon mixed in 8 oz of water once daily 8)  Pravachol 40 Mg Tabs (Pravastatin sodium) .... 2 tabs by mouth qday 9)  Pantoprazole Sodium 40 Mg Tbec (Pantoprazole sodium) .... Take 1 tablet by mouth two times a day 10)  Flexeril 5 Mg Tabs (Cyclobenzaprine hcl) .... Take 1 pill by mouth three times a day for 3 wks.   Patient Instructions: 1)  You can take Ibuprofen 200 mg pill as needed for pain up to every 6 hourly. It can irritate your stomach and Protonix, which you already are taking will help for it. If the stomach irritation is worse stop taking it and come to the clinic.  2)  Please schedule a follow-up  appointment in 2  months. 3)  Limit your Sodium (Salt). 4)  Limit your Sodium (Salt) to less than 2 grams a day(slightly less than 1/2 a teaspoon) to prevent fluid retention, swelling, or worsening of symptoms. 5)  You need to lose weight. Consider a lower calorie diet and regular exercise.  6)  Schedule your mammogram.    Prescriptions: FLEXERIL 5 MG  TABS (CYCLOBENZAPRINE HCL) take 1 pill by mouth three times a day for 3 wks.  #63 x 0   Entered and Authorized by:   Jason Coop MD   Signed by:   Jason Coop MD on 05/20/2007   Method used:   Print then Give to Patient   RxID:   [id number] PRAVACHOL 40 MG TABS (PRAVASTATIN SODIUM) 2 tabs by mouth Qday  #62 x 10   Entered and Authorized by:   Jason Coop MD   Signed by:   Jason Coop MD on 05/20/2007   Method used:   Print then Give to Patient   RxID:   [id number] LISINOPRIL 40 MG TABS (LISINOPRIL) Take 1 tablet by mouth once a day  #31 x 10   Entered and Authorized by:   Jason Coop MD   Signed by:   Jason Coop MD on 05/20/2007   Method used:   Print then Give to Patient   RxID:   [id number] NORVASC 10 MG TABS (AMLODIPINE BESYLATE) Take 1 tablet by mouth once a day  #31 x 10   Entered and Authorized by:   Jason Coop MD   Signed by:   Jason Coop MD on 05/20/2007   Method used:   Print then Give to Patient   RxID:   [id number]  ]

## 2010-03-22 NOTE — Letter (Signed)
Summary: Handout Printed  Printed Handout:  - *Patient Instructions 

## 2010-03-22 NOTE — Assessment & Plan Note (Signed)
Summary: 2 WEEK F/U WITH LABS/CFB   Vital Signs:  Patient profile:   70 year old female Height:      63 inches (160.02 cm) Weight:      178.9 pounds (81.32 kg) BMI:     31.81 O2 Sat:      100 % on Room air Temp:     97.9 degrees F (36.61 degrees C) oral Pulse (ortho):   64 / minute BP sitting:   134 / 62  (right arm)  Vitals Entered By: Stanton Kidney Ditzler RN (October 04, 2009 9:18 AM)  O2 Flow:  Room air Is Patient Diabetic? No Pain Assessment Patient in pain? yes     Location: chest Intensity: 8 Type: pressure Onset of pain  going on for years - this time since last appt. Nutritional Status BMI of > 30 = obese Nutritional Status Detail appetite good  Have you ever been in a relationship where you felt threatened, hurt or afraid?denies   Does patient need assistance? Functional Status Self care Ambulation Normal Comments FU - pain top of head off and on for years. Last week both legs and feet swollen. Pressure in chest since last appt.   Primary Care Provider:  Darnelle Maffucci MD   History of Present Illness: Pt is a 70 yo female w/ past med hx below here for 1 week followup.   the patient presented last week with symptoms of dyspnea on exertion, Palpation and chest tightness that radiates from the left to the right side the patient's pain is somewhat atypical however given her risk factors and age it is worrisome. An EKG was done which showed some nonspecific T-wave abnormalities and some signs of atrial strain. We will order routine labs today and a cardiology follow-up for further evaluation and risk stratification.   Patient is feeling well and denies abdominal pain, nausea, vomiting, HA's, blurred vision. fever, chills, diarrhea, constipation.    Depression History:      The patient denies a depressed mood most of the day and a diminished interest in her usual daily activities.         Preventive Screening-Counseling & Management  Alcohol-Tobacco     Alcohol  drinks/day: 0     Smoking Status: never  Caffeine-Diet-Exercise     Does Patient Exercise: yes     Type of exercise: bike - sometime; weighs     Times/week: 1-2  Current Medications (verified): 1)  Norvasc 10 Mg Tabs (Amlodipine Besylate) .... Take 1 Tablet By Mouth Once A Day 2)  Aspirin 81 Mg Tbec (Aspirin) .... Take 1 Tablet By Mouth Once A Day 3)  Multivitamins  Tabs (Multiple Vitamin) .... Take 1 Tablet By Mouth Once A Day 4)  Metamucil 30.9 % Powd (Psyllium) .... Take 1 Teaspoon Mixed in 8 Oz of Water Once Daily 5)  Simvastatin 40 Mg Tabs (Simvastatin) .... Take 1 Tablet By Mouth Once A Day 6)  Omeprazole 20 Mg  Cpdr (Omeprazole) .Marland Kitchen.. 1 Each Day 20-30 Min Prior To Breakfast Meal 7)  Oscal 500/200 D-3 500-200 Mg-Unit Tabs (Calcium-Vitamin D) .... Take 1 Tablet By Mouth Two Times A Day 8)  Tylenol Extra Strength 500 Mg Tabs (Acetaminophen) .... Take 2 Tablets By Mouth Three Times A Day As Needed For Pain 9)  Proventil Hfa 108 (90 Base) Mcg/act Aers (Albuterol Sulfate) .... Take 1-2 Puffs Every 4 Hours As Needed For Shortness of Breath and Wheezing. 10)  Ferrous Sulfate 325 (65 Fe) Mg Tabs (Ferrous Sulfate) .... Take  1 Tablet By Mouth Two Times A Day 11)  Miralax  Powd (Polyethylene Glycol 3350) .... Mix 17g With Glass of Water and Drink Once Daily. 12)  Hydroxyzine Hcl 25 Mg/ml Soln (Hydroxyzine Hcl) .... One Tablet Every 6 Hours As Needed For Itching 13)  Ammonium Lactate 12 % Lotn (Ammonium Lactate) .... Apply To Skin Three Times A Day As Needed For Itching  Allergies: 1)  Ibuprofen  Review of Systems       As per HPI  Physical Exam  General:  alert, pleasant, well groomed, no distress.  Lungs:  Normal respiratory effort, chest expands symmetrically. Lungs are clear to auscultation, no crackles or wheezes. Heart:  Normal rate and regular rhythm. Abdomen:  +BS's, soft, NT and ND. Msk:  no joint swelling, no joint warmth, and no redness over joints.    Pulses:  R radial  normal.   Extremities:  no edema.  Neurologic:  alert & oriented X3, cranial nerves II-XII intact, strength normal in all extremities, sensation intact to light touch, and gait normal.     Skin:   turgor normal and no rashes.    Impression & Recommendations:  Problem # 1:  PALPITATIONS (ICD-785.1) EKG showed some signs of atrial strain and some nonspecific T-wave abnormalities.   The patient is not on any medication to explain her palpitation. Will get TSH, CMET, CBC, BNP and refer for cardiology for further workup and risk stratification given her risk profile.   Orders: Cardiology Referral (Cardiology)  Problem # 2:  DYSPNEA ON EXERTION (ICD-786.09) per problem number one  Her updated medication list for this problem includes:    Proventil Hfa 108 (90 Base) Mcg/act Aers (Albuterol sulfate) .Marland Kitchen... Take 1-2 puffs every 4 hours as needed for shortness of breath and wheezing.  Orders: T-Comprehensive Metabolic Panel [phone]) T-TSH [phone]) T-T4, Free [phone]) Cardiology Referral (Cardiology) T-Iron [phone]) T-Iron Binding Capacity (TIBC) ([id number]) T-Ferritin [phone]) T-CBC No Diff ([id number]) T-BNP  (B Natriuretic Peptide) ([id number]) T- * Misc. Laboratory test [phone])  Problem # 3:  ANEMIA (ICD-285.9) continue current rx.   Her updated medication list for this problem includes:    Ferrous Sulfate 325 (65 Fe) Mg Tabs (Ferrous sulfate) .Marland Kitchen... Take 1 tablet by mouth two times a day  Hgb: 12.7 (11/23/2008)   Hct: 39.7 (11/23/2008)   Platelets: 219 (11/23/2008) RBC: 4.12 (11/23/2008)   RDW: 14.0 (11/23/2008)   WBC: 2.9 (11/23/2008) MCV: 96.4 (11/23/2008)   MCHC: 32.0 (11/23/2008) Ferritin: 31 (02/07/2008) Iron: 29 (02/07/2008)   TIBC: 283 (02/07/2008)   % Sat: 10 (02/07/2008) B12: 516 (02/07/2008)   Folate: 702 (02/07/2008)   TSH: 1.232 (11/23/2008)  Problem # 4:  HYPERTENSION (ICD-401.9) Well controlled on current treatment, No new changes made  today, Will continue to monitor.   Her updated medication list for this problem includes:    Norvasc 10 Mg Tabs (Amlodipine besylate) .Marland Kitchen... Take 1 tablet by mouth once a day  BP today: 134/62 Prior BP: 123/70 (09/20/2009)  Labs Reviewed: K+: 4.4 (04/14/2009) Creat: : 0.84 (04/14/2009)   Chol: 239 (04/14/2009)   HDL: 63 (04/14/2009)   LDL: 159 (04/14/2009)   TG: 84 (04/14/2009)  Problem # 5:  HYPERLIPIDEMIA (ICD-272.4) LDL remains elevated, may consider changing statins, especially as SOB may be caused by her simvastatin if no other explanation can be found.   Her updated medication list for this problem includes:    Simvastatin 40 Mg Tabs (Simvastatin) .Marland Kitchen... Take 1 tablet by mouth once a day  Labs Reviewed:  SGOT: 18 (04/14/2009)   SGPT: 16 (04/14/2009)   HDL:63 (04/14/2009), 67 (11/23/2008)  LDL:159 (04/14/2009), 164 (11/23/2008)  Chol:239 (04/14/2009), 247 (11/23/2008)  Trig:84 (04/14/2009), 79 (11/23/2008)  Complete Medication List: 1)  Norvasc 10 Mg Tabs (Amlodipine besylate) .... Take 1 tablet by mouth once a day 2)  Aspirin 81 Mg Tbec (Aspirin) .... Take 1 tablet by mouth once a day 3)  Multivitamins Tabs (Multiple vitamin) .... Take 1 tablet by mouth once a day 4)  Metamucil 30.9 % Powd (Psyllium) .... Take 1 teaspoon mixed in 8 oz of water once daily 5)  Simvastatin 40 Mg Tabs (Simvastatin) .... Take 1 tablet by mouth once a day 6)  Omeprazole 20 Mg Cpdr (Omeprazole) .Marland Kitchen.. 1 each day 20-30 min prior to breakfast meal 7)  Oscal 500/200 D-3 500-200 Mg-unit Tabs (Calcium-vitamin d) .... Take 1 tablet by mouth two times a day 8)  Tylenol Extra Strength 500 Mg Tabs (Acetaminophen) .... Take 2 tablets by mouth three times a day as needed for pain 9)  Proventil Hfa 108 (90 Base) Mcg/act Aers (Albuterol sulfate) .... Take 1-2 puffs every 4 hours as needed for shortness of breath and wheezing. 10)  Ferrous Sulfate 325 (65 Fe) Mg Tabs (Ferrous sulfate) .... Take 1 tablet by mouth two  times a day 11)  Miralax Powd (Polyethylene glycol 3350) .... Mix 17g with glass of water and drink once daily. 12)  Hydroxyzine Hcl 25 Mg/ml Soln (Hydroxyzine hcl) .... One tablet every 6 hours as needed for itching 13)  Ammonium Lactate 12 % Lotn (Ammonium lactate) .... Apply to skin three times a day as needed for itching  Other Orders: T-Hemoccult Card-Multiple (take home) ([id number])  Patient Instructions: 1)  Please schedule a follow-up appointment in 1 month. Process Orders Check Orders Results:     [facility]: ABN not required for this insurance Order queued for requisitioning for [facility]: October 04, 2009 12:07 PM  Tests Sent for requisitioning (October 04, 2009 12:10 PM):     10/04/2009: [facility] -- T-Comprehensive Metabolic Panel [[id number]] (signed)     10/04/2009: [facility] -- T-TSH [phone] (signed)     10/04/2009: [facility] -- [place], [place] [[id number]] (signed)     10/04/2009: [facility] -- [place] [[id number]] (signed)     10/04/2009: [facility] -- T-Iron Binding Capacity (TIBC) [[id number]] (signed)     10/04/2009: [facility] -- T-Ferritin [phone] (signed)     10/04/2009: [facility] -- T-CBC No Diff [[id number]] (signed)     10/04/2009: [facility] -- T-BNP  (B Natriuretic Peptide) [phone] (signed)     10/04/2009: [facility] -- T- * Misc. Laboratory test [phone] (signed)     Prevention & Chronic Care Immunizations   Influenza vaccine: Fluvax Non-MCR  (12/28/2006)   Influenza vaccine deferral: Refused  (01/18/2009)    Tetanus booster: Not documented   Td booster deferral: Deferred  (10/04/2009)    Pneumococcal vaccine: Not documented   Pneumococcal vaccine deferral: Deferred  (10/04/2009)    H. zoster vaccine: Not documented   H. zoster vaccine deferral: Deferred   (10/04/2009)  Colorectal Screening   Hemoccult: Negative  (10/20/2003)   Hemoccult action/deferral: Ordered  (10/04/2009)    Colonoscopy: Results: Polyp.  Pt with polyp in transverse colon  (10/03/2006)   Colonoscopy action/deferral: Repeat colonoscopy in 5 years.   (10/03/2006)   Colonoscopy due: 10/03/2011  Other Screening   Pap smear: Not documented   Pap smear  action/deferral: Not indicated S/P hysterectomy  (09/09/2008)    Mammogram: ASSESSMENT: Benign - BI-RADS 2^MM DIGITAL SCREENING  (05/07/2009)   Mammogram action/deferral: Ordered  (04/14/2009)   Mammogram due: 09/10/2009    DXA bone density scan: Not documented   DXA bone density action/deferral: Refused  (04/14/2009)   Smoking status: never  (10/04/2009)  Lipids   Total Cholesterol: 239  (04/14/2009)   LDL: 159  (04/14/2009)   LDL Direct: Not documented   HDL: 63  (04/14/2009)   Triglycerides: 84  (04/14/2009)    SGOT (AST): 18  (04/14/2009)   SGPT (ALT): 16  (04/14/2009) CMP ordered    Alkaline phosphatase: 83  (04/14/2009)   Total bilirubin: 0.4  (04/14/2009)    Lipid flowsheet reviewed?: Yes   Progress toward LDL goal: Unchanged  Hypertension   Last Blood Pressure: 134 / 62  (10/04/2009)   Serum creatinine: 0.84  (04/14/2009)   Serum potassium 4.4  (04/14/2009) CMP ordered     Hypertension flowsheet reviewed?: Yes   Progress toward BP goal: At goal  Self-Management Support :   Personal Goals (by the next clinic visit) :      Personal blood pressure goal: 140/90  (08/02/2009)     Personal LDL goal: 100  (08/02/2009)    Patient will work on the following items until the next clinic visit to reach self-care goals:     Medications and monitoring: take my medicines every day, check my blood pressure, bring all of my medications to every visit, weigh myself weekly  (10/04/2009)     Eating: drink diet soda or water instead of juice or soda, eat more vegetables, use fresh or frozen vegetables, eat  foods that are low in salt, eat fruit for snacks and desserts, limit or avoid alcohol  (10/04/2009)     Activity: take a 30 minute walk every day, park at the far end of the parking lot  (10/04/2009)    Hypertension self-management support: Written self-care plan, Education handout, Resources for patients handout  (10/04/2009)   Hypertension self-care plan printed.   Hypertension education handout printed    Lipid self-management support: Written self-care plan, Education handout, Resources for patients handout  (10/04/2009)   Lipid self-care plan printed.   Lipid education handout printed      Resource handout printed.   Nursing Instructions: Provide Hemoccult cards with instructions (see order)   Process Orders Check Orders Results:     [facility]: ABN not required for this insurance Order queued for requisitioning for [facility]: October 04, 2009 12:07 PM  Tests Sent for requisitioning (October 04, 2009 12:10 PM):     10/04/2009: [facility] -- T-Comprehensive Metabolic Panel [[id number]] (signed)     10/04/2009: [facility] -- T-TSH [phone] (signed)     10/04/2009: [facility] -- [place], [place] [[id number]] (signed)     10/04/2009: [facility] -- [place] [[id number]] (signed)     10/04/2009: [facility] -- T-Iron Binding Capacity (TIBC) [[id number]] (signed)     10/04/2009: [facility] -- T-Ferritin [[id number]] (signed)     10/04/2009: [facility] -- T-CBC No Diff [[id number]] (signed)     10/04/2009: [facility] -- T-BNP  (B Natriuretic Peptide) [phone] (signed)     10/04/2009: [facility] -- T- * Misc. Laboratory test [phone] (signed)

## 2010-03-22 NOTE — Assessment & Plan Note (Signed)
Summary: chest xray for TB screening/gg       see last note   Vital Signs:  Patient profile:   70 year old female Height:      63 inches (160.02 cm) Weight:      174.1 pounds (79.14 kg) BMI:     30.95 Temp:     97.5 degrees F (36.39 degrees C) oral Pulse rate:   77 / minute BP sitting:   133 / 77  (right arm)  Vitals Entered By: Stanton Kidney Ditzler RN (September 09, 2008 10:21 AM) CC: Depression Is Patient Diabetic? No Pain Assessment Patient in pain? yes     Location: right knee Intensity: 7 Onset of pain  long time Nutritional Status BMI of > 30 = obese Nutritional Status Detail appetite good  Have you ever been in a relationship where you felt threatened, hurt or afraid?denies   Does patient need assistance? Functional Status Self care Ambulation Normal Comments Needs CXR for work - unable to do skin test for TB. Tube in left ear 09/11/08 Dr Jearld Fenton.   Primary Care Provider:  Rufina Falco, MD  CC:  Depression.  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  She needs referal for a chest xray.  She has no complaints today.  Depression History:      The patient denies a depressed mood most of the day and a diminished interest in her usual daily activities.         Preventive Screening-Counseling & Management  Alcohol-Tobacco     Alcohol drinks/day: 0     Smoking Status: never  Caffeine-Diet-Exercise     Does Patient Exercise: yes     Type of exercise: bike - sometime; weighs     Times/week: 1-2  Medications Prior to Update: 1)  Norvasc 10 Mg Tabs (Amlodipine Besylate) .... Take 1 Tablet By Mouth Once A Day 2)  Aspirin 81 Mg Tbec (Aspirin) .... Take 1 Tablet By Mouth Once A Day 3)  Multivitamins  Tabs (Multiple Vitamin) .... Take 1 Tablet By Mouth Once A Day 4)  Dulcolax 5 Mg Tbec (Bisacodyl) .... Take 1 Tablet By Mouth Two Times A Day As Needed For  Constipation 5)  Colace 100 Mg Caps (Docusate Sodium) .... Take 1 Capsule By Mouth Two Times A Day 6)  Metamucil 30.9 % Powd (Psyllium) .... Take 1 Teaspoon Mixed in 8 Oz of Water Once Daily 7)  Pravastatin Sodium 40 Mg Tabs (Pravastatin Sodium) .... Take 2 Tablets By Mouth Once Daily 8)  Ferrous Sulfate 325 (65 Fe) Mg Tabs (Ferrous Sulfate) .... Take 1 Tablet By Mouth Two Times A Day 9)  Omeprazole 20 Mg  Cpdr (Omeprazole) .Marland Kitchen.. 1 Each Day 20-30 Min Prior To Breakfast Meal 10)  Amoxil 500 Mg Caps (Amoxicillin) .... Take 1 Tablet By Mouth Two Times A Day For 7 Days. 11)  Ibuprofen 400 Mg Tabs (Ibuprofen) .... Take 1 Tablet By Mouth Three Times A Day As Needed For Pain 12)  Ambien 5 Mg Tabs (Zolpidem Tartrate) .... Take One Pill Each Night 13)  Amitriptyline Hcl 25 Mg Tabs (Amitriptyline Hcl) .... Take One Pill Each Night 14)  Oscal 500/200 D-3 500-200 Mg-Unit Tabs (Calcium-Vitamin D) .... Take 1 Tablet By Mouth Two Times A Day  Allergies: 1)  ! Ibuprofen  Review of Systems       per hpi  Physical Exam  General:  alert and well-developed.   Lungs:  normal respiratory effort and normal breath sounds.   Heart:  normal rate, regular rhythm, and no murmur.     Impression & Recommendations:  Problem # 1:  SCREENING EXAMINATION FOR PULMONARY TUBERCULOSIS (ICD-V74.1) had bcg vaccine so needs cxr for tb screening.  Orders: CXR- 2view (CXR)  Complete Medication List: 1)  Norvasc 10 Mg Tabs (Amlodipine besylate) .... Take 1 tablet by mouth once a day 2)  Aspirin 81 Mg Tbec (Aspirin) .... Take 1 tablet by mouth once a day 3)  Multivitamins Tabs (Multiple vitamin) .... Take 1 tablet by mouth once a day 4)  Dulcolax 5 Mg Tbec (Bisacodyl) .... Take 1 tablet by mouth two times a day as needed for constipation 5)  Colace 100 Mg Caps (Docusate sodium) .... Take 1 capsule by mouth two times a day 6)  Metamucil 30.9 % Powd (Psyllium) .... Take 1 teaspoon mixed in 8 oz of water once daily 7)   Pravastatin Sodium 40 Mg Tabs (Pravastatin sodium) .... Take 2 tablets by mouth once daily 8)  Ferrous Sulfate 325 (65 Fe) Mg Tabs (Ferrous sulfate) .... Take 1 tablet by mouth two times a day 9)  Omeprazole 20 Mg Cpdr (Omeprazole) .Marland Kitchen.. 1 each day 20-30 min prior to breakfast meal 10)  Amoxil 500 Mg Caps (Amoxicillin) .... Take 1 tablet by mouth two times a day for 7 days. 11)  Ibuprofen 400 Mg Tabs (Ibuprofen) .... Take 1 tablet by mouth three times a day as needed for pain 12)  Ambien 5 Mg Tabs (Zolpidem tartrate) .... Take one pill each night 13)  Amitriptyline Hcl 25 Mg Tabs (Amitriptyline hcl) .... Take one pill each night 14)  Oscal 500/200 D-3 500-200 Mg-unit Tabs (Calcium-vitamin d) .... Take 1 tablet by mouth two times a day  Prevention & Chronic Care Immunizations   Influenza vaccine: Fluvax Non-MCR  (12/28/2006)    Tetanus booster: Not documented    Pneumococcal vaccine: Not documented    H. zoster vaccine: Not documented  Colorectal Screening   Hemoccult: Negative  (10/20/2003)    Colonoscopy: Results: Polyp.  Pt with polyp in transverse colon  (10/03/2006)   Colonoscopy action/deferral: Repeat colonoscopy in 5 years.   (10/03/2006)   Colonoscopy due: 10/03/2011  Other Screening   Pap smear: Not documented   Pap smear action/deferral: Not indicated S/P hysterectomy  (09/09/2008)    Mammogram: No specific mammographic evidence of malignancy.  Assessment: BIRADS 1.   (09/11/2007)   Mammogram action/deferral: Screening mammogram in 1 year.     (09/11/2007)   Mammogram due: 09/10/2009    DXA bone density scan: Not documented   Smoking status: never  (09/09/2008)  Lipids   Total Cholesterol: 219  (09/11/2007)   LDL: 146  (09/11/2007)   LDL Direct: Not documented   HDL: 58  (09/11/2007)   Triglycerides: 73  (09/11/2007)    SGOT (AST): 21  (07/29/2008)   SGPT (ALT): 17  (07/29/2008)   Alkaline phosphatase: 97  (07/29/2008)   Total bilirubin: 0.4   (07/29/2008)  Hypertension   Last Blood Pressure: 133 / 77  (09/09/2008)   Serum creatinine: 0.93  (07/29/2008)   Serum potassium 4.2  (07/29/2008)  Self-Management Support :    Hypertension self-management support: Not documented    Lipid self-management support: Not documented

## 2010-03-22 NOTE — Miscellaneous (Signed)
Summary: HIPAA Restrictions  HIPAA Restrictions   Imported By: Florinda Marker 06/03/2008 16:33:49  _____________________________________________________________________  External Attachment:    Type:   Image     Comment:   External Document

## 2010-03-22 NOTE — Assessment & Plan Note (Signed)
Summary: EST-1 MONTH F/U VISIT/CH   Vital Signs:  Patient profile:   70 year old female Height:      63 inches (160.02 cm) Weight:      180.7 pounds (82.14 kg) BMI:     32.13 Temp:     97.1 degrees F (36.17 degrees C) oral Pulse rate:   71 / minute BP sitting:   113 / 64  (right arm)  Vitals Entered By: Stanton Kidney Ditzler RN (December 06, 2009 1:47 PM) CC: knee pain more on right Is Patient Diabetic? No Pain Assessment Patient in pain? no      Nutritional Status BMI of > 30 = obese Nutritional Status Detail appetite good  Have you ever been in a relationship where you felt threatened, hurt or afraid?denies   Does patient need assistance? Functional Status Self care Ambulation Normal Comments FU - feeling better - still has right knee pain.Marland Kitchen Refill on sleeping pill.   Primary Care Provider:  Darnelle Maffucci, MD  CC:  knee pain more on right.  History of Present Illness: Pt is a 70 yo female w/ past med hx below here for regular followup, denies new complaints, and only has chronic knee pain which is unchanged from past visits.   Pts CP has resolved since she was seen last, and cardiac workup was negative.   Also pt has FOBT +, was sent to GI, GI did not rec further eval at this point.      Patient is feeling well and denies abdominal pain, nausea, vomiting, HA's, blurred vision. fever, chills, diarrhea, constipation.    Depression History:      The patient denies a depressed mood most of the day and a diminished interest in her usual daily activities.         Preventive Screening-Counseling & Management  Alcohol-Tobacco     Alcohol drinks/day: 0     Smoking Status: never  Caffeine-Diet-Exercise     Does Patient Exercise: yes     Type of exercise: bike - sometime; weighs     Times/week: 1-2  Current Medications (verified): 1)  Norvasc 10 Mg Tabs (Amlodipine Besylate) .... Take 1 Tablet By Mouth Once A Day 2)  Aspirin 81 Mg Tbec (Aspirin) .... Take 1 Tablet By  Mouth Once A Day 3)  Multivitamins  Tabs (Multiple Vitamin) .... Take 1 Tablet By Mouth Once A Day 4)  Simvastatin 40 Mg Tabs (Simvastatin) .... Take 1 Tablet By Mouth Once A Day 5)  Omeprazole 20 Mg  Cpdr (Omeprazole) .Marland Kitchen.. 1 Each Day 20-30 Min Prior To Breakfast Meal 6)  Tylenol Extra Strength 500 Mg Tabs (Acetaminophen) .... Take 2 Tablets By Mouth Three Times A Day As Needed For Pain 7)  Miralax  Powd (Polyethylene Glycol 3350) .... Mix 17g With Glass of Water and Drink Once Daily. 8)  Hydroxyzine Hcl 25 Mg/ml Soln (Hydroxyzine Hcl) .... One Tablet Every 6 Hours As Needed For Itching 9)  Ammonium Lactate 12 % Lotn (Ammonium Lactate) .... Apply To Skin Three Times A Day As Needed For Itching 10)  Amitiza 8 Mcg  Caps (Lubiprostone) .Marland Kitchen.. 1 Two Times A Day/take With Food and Water  Allergies: 1)  Ibuprofen  Review of Systems       negative except per HPI  Physical Exam  General:  alert, pleasant, well groomed, no distress.  Lungs:  Normal respiratory effort, chest expands symmetrically. Lungs are clear to auscultation, no crackles or wheezes. Heart:  Normal rate and regular rhythm. Abdomen:  +  BS's, soft, NT and ND. Pulses:  R radial normal.   Extremities:  no edema.  Neurologic:  alert & oriented X3, cranial nerves II-XII intact, strength normal in all extremities, sensation intact to light touch, and gait normal.       Impression & Recommendations:  Problem # 1:  CHEST PAIN (ICD-786.50) Assessment Comment Only this has resolved now, and pt also had negative cardiac workup.   Problem # 2:  OSTEOARTHRITIS (ICD-715.90) Assessment: Comment Only stable, pt c/o on some right knee pain, however this is unchanged from past visits.   Her updated medication list for this problem includes:    Aspirin 81 Mg Tbec (Aspirin) .Marland Kitchen... Take 1 tablet by mouth once a day    Tylenol Extra Strength 500 Mg Tabs (Acetaminophen) .Marland Kitchen... Take 2 tablets by mouth three times a day as needed for  pain  Problem # 3:  HYPERTENSION (ICD-401.9) Assessment: Comment Only Well controlled on current treatment, No new changes made today, Will continue to monitor.  Her updated medication list for this problem includes:    Norvasc 10 Mg Tabs (Amlodipine besylate) .Marland Kitchen... Take 1 tablet by mouth once a day  BP today: 113/64 Prior BP: 126/58 (12/03/2009)  Labs Reviewed: K+: 4.2 (10/04/2009) Creat: : 0.96 (10/04/2009)   Chol: 206 (11/09/2009)   HDL: 66.50 (11/09/2009)   LDL: 159 (04/14/2009)   TG: 68.0 (11/09/2009)  Problem # 4:  HYPERLIPIDEMIA (ICD-272.4) Assessment: Comment Only LDL slightly elevated, when Lipitor becomes generic in the end of the month will switch pt to that for a target ldl of 100.  Her updated medication list for this problem includes:    Simvastatin 40 Mg Tabs (Simvastatin) .Marland Kitchen... Take 1 tablet by mouth once a day  Labs Reviewed: SGOT: 22 (11/09/2009)   SGPT: 18 (11/09/2009)   HDL:66.50 (11/09/2009), 63 (04/14/2009)  LDL:159 (04/14/2009), 164 (11/23/2008)  Chol:206 (11/09/2009), 239 (04/14/2009)  Trig:68.0 (11/09/2009), 84 (04/14/2009)  Problem # 5:  INSOMNIA (ICD-780.52) Assessment: Comment Only pt has insomnia at time, and would like a refill a medication she obtained in the past for this which has worked well for her.  however pt does not know the name of the medication and is not on our system, i asked the pt to find the name of this, and that I would be happy to refill it for her.  Complete Medication List: 1)  Norvasc 10 Mg Tabs (Amlodipine besylate) .... Take 1 tablet by mouth once a day 2)  Aspirin 81 Mg Tbec (Aspirin) .... Take 1 tablet by mouth once a day 3)  Multivitamins Tabs (Multiple vitamin) .... Take 1 tablet by mouth once a day 4)  Simvastatin 40 Mg Tabs (Simvastatin) .... Take 1 tablet by mouth once a day 5)  Omeprazole 20 Mg Cpdr (Omeprazole) .Marland Kitchen.. 1 each day 20-30 min prior to breakfast meal 6)  Tylenol Extra Strength 500 Mg Tabs (Acetaminophen)  .... Take 2 tablets by mouth three times a day as needed for pain 7)  Miralax Powd (Polyethylene glycol 3350) .... Mix 17g with glass of water and drink once daily. 8)  Hydroxyzine Hcl 25 Mg/ml Soln (Hydroxyzine hcl) .... One tablet every 6 hours as needed for itching 9)  Ammonium Lactate 12 % Lotn (Ammonium lactate) .... Apply to skin three times a day as needed for itching 10)  Amitiza 8 Mcg Caps (Lubiprostone) .Marland Kitchen.. 1 two times a day/take with food and water  Patient Instructions: 1)  Please schedule a follow-up appointment in 6 months.  Orders Added: 1)  Est. Patient Level III [66440]    Prevention & Chronic Care Immunizations   Influenza vaccine: Fluvax Non-MCR  (12/28/2006)   Influenza vaccine deferral: Refused  (01/18/2009)    Tetanus booster: Not documented   Td booster deferral: Deferred  (10/04/2009)    Pneumococcal vaccine: Not documented   Pneumococcal vaccine deferral: Deferred  (10/04/2009)    H. zoster vaccine: Not documented   H. zoster vaccine deferral: Deferred  (10/04/2009)  Colorectal Screening   Hemoccult: Negative  (10/20/2003)   Hemoccult action/deferral: Ordered  (10/04/2009)    Colonoscopy: Results: Polyp.  Pt with polyp in transverse colon  (10/03/2006)   Colonoscopy action/deferral: GI referral  (10/15/2009)   Colonoscopy due: 10/03/2011  Other Screening   Pap smear: Not documented   Pap smear action/deferral: Not indicated S/P hysterectomy  (09/09/2008)    Mammogram: ASSESSMENT: Benign - BI-RADS 2^MM DIGITAL SCREENING  (05/07/2009)   Mammogram action/deferral: Ordered  (04/14/2009)   Mammogram due: 09/10/2009    DXA bone density scan: Not documented   DXA bone density action/deferral: Refused  (04/14/2009)   Smoking status: never  (12/06/2009)  Lipids   Total Cholesterol: 206  (11/09/2009)   LDL: 159  (04/14/2009)   LDL Direct: 119.5  (11/09/2009)   HDL: 66.50  (11/09/2009)   Triglycerides: 68.0  (11/09/2009)    SGOT (AST): 22   (11/09/2009)   SGPT (ALT): 18  (11/09/2009)   Alkaline phosphatase: 69  (11/09/2009)   Total bilirubin: 0.5  (11/09/2009)    Lipid flowsheet reviewed?: Yes   Progress toward LDL goal: At goal  Hypertension   Last Blood Pressure: 113 / 64  (12/06/2009)   Serum creatinine: 0.96  (10/04/2009)   Serum potassium 4.2  (10/04/2009)    Hypertension flowsheet reviewed?: Yes   Progress toward BP goal: At goal  Self-Management Support :   Personal Goals (by the next clinic visit) :      Personal blood pressure goal: 140/90  (08/02/2009)     Personal LDL goal: 100  (08/02/2009)    Patient will work on the following items until the next clinic visit to reach self-care goals:     Medications and monitoring: take my medicines every day, check my blood pressure, bring all of my medications to every visit, weigh myself weekly  (12/06/2009)     Eating: eat more vegetables, use fresh or frozen vegetables, eat foods that are low in salt, eat baked foods instead of fried foods, eat fruit for snacks and desserts, limit or avoid alcohol  (12/06/2009)     Activity: take a 30 minute walk every day, take the stairs instead of the elevator, park at the far end of the parking lot  (12/06/2009)    Hypertension self-management support: Written self-care plan, Education handout, Resources for patients handout  (12/06/2009)   Hypertension self-care plan printed.   Hypertension education handout printed    Lipid self-management support: Written self-care plan, Education handout, Resources for patients handout  (12/06/2009)   Lipid self-care plan printed.   Lipid education handout printed      Resource handout printed.   Patient Instructions: 1)  Please schedule a follow-up appointment in 6 months.

## 2010-03-22 NOTE — Assessment & Plan Note (Signed)
Summary: checkup [mkj]   Vital Signs:  Patient Profile:   70 Years Old Female Height:     63 inches (160.02 cm) Weight:      175.5 pounds (79.77 kg) BMI:     31.20 Temp:     97.4 degrees F (36.33 degrees C) oral Pulse rate:   81 / minute BP sitting:   127 / 72  (right arm)  Pt. in pain?   yes    Location:   head    Intensity:   5    Type:       throbbing  Vitals Entered By: Chinita Pester RN (September 03, 2007 3:08 PM)              Is Patient Diabetic? No Nutritional Status BMI of > 30 = obese  Have you ever been in a relationship where you felt threatened, hurt or afraid?No   Does patient need assistance? Functional Status Self care Ambulation Normal     Chief Complaint:  Check-up.  History of Present Illness: Pt is a 70 year old woman with PMH significant for HTN, HLD, arthritis, depression, anxiety, constipation, and an abnormal mammogram who presents to clinic for check up.  Pt reports that she now would like to get her mammogram done because she felt a twinge her her breast which she attributes to her bra.  She denies any discharge, lumps, change in appearance, dimpling, or weight loss.  Pt would also like to get a refill for her protonix.  Of note pt is currently in the process of getting bilateral cataract surgery and is seeing Dr. Heather [place].  She just needs to tell them when she wants to get it done.  Pt stopped taking her Effexor because she feels like she doesn't need it.     Prior Medications Reviewed Using: Patient Recall  Current Allergies (reviewed today): No known allergies   Past Medical History:    Hyperlipidemia    Hypertension    Panic disorder    Unstable angina    Constipation    GERD    Depression    Osteoarthritis    palpitations    Hx of dizziness    blurred vision    right thumb skin lesion:  ? ganglioma    Bilateral cataracts.    Risk Factors:  Tobacco use:  never Alcohol use:  no Exercise:  yes    Times per week:  3    Type:   bike - sometime; weighs Seatbelt use:  100 %   Review of Systems       SEE HPI   Physical Exam  General:     alert, well-developed, well-nourished, well-hydrated, and overweight-appearing.   Head:     normocephalic and atraumatic.   Mouth:     pharynx pink and moist.   Neck:     supple.   Lungs:     normal respiratory effort, no intercostal retractions, no accessory muscle use, and normal breath sounds.   Heart:     normal rate, regular rhythm, no murmur, and no gallop.   Abdomen:     soft, non-tender, and normal bowel sounds.   Msk:     normal ROM.   Extremities:     No clubbing, cyanosis, edema, or deformity noted with normal full range of motion of all joints.   Neurologic:     alert & oriented X3.   Skin:     color normal, no rashes, and no suspicious lesions.  Psych:     Oriented X3, normally interactive, good eye contact, not anxious appearing, and not depressed appearing.      Impression & Recommendations:  Problem # 1:  MAMMOGRAM, ABNORMAL, HX OF (ICD-V15.9) Will order a diagnostic mammogram given her history of abnormal MMG.   Orders: Mammogram (Diagnostic) (Mammo)   Problem # 2:  DEPRESSION (ICD-311) Pt is no longer taking the effexor and has no complaints of depression or anxiety for that matter.  The following medications were removed from the medication list:    Effexor 37.5 Mg Tabs (Venlafaxine hcl) .Marland Kitchen... Take 1 tablet by mouth once a day   Problem # 3:  HYPERLIPIDEMIA (ICD-272.4) Will have pt come back in the next week or so to recheck her FLP.  Continue current medications.  Her updated medication list for this problem includes:    Pravachol 40 Mg Tabs (Pravastatin sodium) .Marland Kitchen... 2 tabs by mouth qday  Future Orders: T-Lipid Profile ([id number]) ... 09/09/2007   Problem # 4:  GERD (ICD-530.81) Pt reports that her protonix is working well.  No complaints of reflux symptoms now and she has only been taking one pill per day.  Her updated  medication list for this problem includes:    Protonix 40 Mg Tbec (Pantoprazole sodium) .Marland Kitchen... Take 1 tablet by mouth once a day   Problem # 5:  HYPERTENSION (ICD-401.9) Good control.  No change to medications.  Her updated medication list for this problem includes:    Norvasc 10 Mg Tabs (Amlodipine besylate) .Marland Kitchen... Take 1 tablet by mouth once a day    Lisinopril 40 Mg Tabs (Lisinopril) .Marland Kitchen... Take 1 tablet by mouth once a day   Complete Medication List: 1)  Norvasc 10 Mg Tabs (Amlodipine besylate) .... Take 1 tablet by mouth once a day 2)  Aspirin 81 Mg Tbec (Aspirin) .... Take 1 tablet by mouth once a day 3)  Colace 100 Mg Caps (Docusate sodium) .... Take 1 capsule by mouth two times a day 4)  Lisinopril 40 Mg Tabs (Lisinopril) .... Take 1 tablet by mouth once a day 5)  Dulcolax 5 Mg Tbec (Bisacodyl) .... Take 1 tablet by mouth two times a day as needed for constipation 6)  Metamucil 30.9 % Powd (Psyllium) .... Take 1 teaspoon mixed in 8 oz of water once daily 7)  Pravachol 40 Mg Tabs (Pravastatin sodium) .... 2 tabs by mouth qday 8)  Protonix 40 Mg Tbec (Pantoprazole sodium) .... Take 1 tablet by mouth once a day 9)  Flexeril 5 Mg Tabs (Cyclobenzaprine hcl) .... Take 1 pill by mouth three times a day for 3 wks.   Patient Instructions: 1)  Please come back on Monday July 20th to have your cholesterol checked.  You do not need to have an appointment with a doctor.   2)  Please schedule an appointment in 3 months for a check up. 3)  Let me know how the cataract surgery goes and the name of your eye doctor. 4)  I will call you with the results of your cholesterol check. 5)  I have called in your presciption for protonix to the [facility] on [address].     Prescriptions: PROTONIX 40 MG  TBEC (PANTOPRAZOLE SODIUM) Take 1 tablet by mouth once a day  #30 x 5   Entered and Authorized by:   Rufina Falco MD   Signed by:   Rufina Falco MD on 09/03/2007   Method used:   Electronically sent  to .Marland KitchenMarland Kitchen  [facility] [address] [id number]*       [address]       [place], [place]  [address]       Ph: [phone]       Fax: [phone]   RxID:   [id number] PROTONIX 40 MG  TBEC (PANTOPRAZOLE SODIUM) Take 1 tablet by mouth once a day  #30 x 5   Entered and Authorized by:   Rufina Falco MD   Signed by:   Rufina Falco MD on 09/03/2007   Method used:   Electronically sent to ...       [facility] [address]       [address]       [place], [place]  [address]       Ph: [phone]       Fax: [phone]   RxID:   [id number]  ]

## 2010-03-22 NOTE — Progress Notes (Signed)
  Phone Note Call from Patient   Caller: Patient Summary of Call: PATIENT RETURNED MY CALL ABOUT HER APPOINTMENT AT [facility]  [address] AT 2:00 pm.  GAVE HER THIS INFORMATION AND SHE WAS ABLEL TO REPEAT THE ABOVE INFORMATION.   Brianna Pittman NTII Initial call taken by: Theotis Barrio,  Jun 29, 2006 11:39 AM

## 2010-03-22 NOTE — Assessment & Plan Note (Signed)
Review of gastrointestinal problems: 1. Small tubular adenoma, screening colonoscopy with me 09/2006, single TA found, recommended repeat colonoscopy at 5 year inteval. 2. dyspepsia, possibly GERD related. EGD February 2010 was normal. Symptoms improved with proton pump inhibitor  History of Present Illness Visit Type: Follow-up Consult Primary GI MD: Rob Bunting MD Primary Provider: Darnelle Maffucci, MD Requesting Provider: Darnelle Maffucci, MD Chief Complaint: Patient here for further evaluation of heme positive stool. History of Present Illness:     very pleasant 70 year old woman who had recent routine physical and was given heme cards.  One of 3 samples was positive for blood. She has mild inttermittne rectal bleeding at time of constipation, straining usually.  Chronic constipation, will go once a week at most.  she really has to strain a lot.  Has tried fiber, without lasting success. Same with miralax (two scoops).    recent lab testing shows she is slightly anemic, normocytic.           Current Medications (verified): 1)  Norvasc 10 Mg Tabs (Amlodipine Besylate) .... Take 1 Tablet By Mouth Once A Day 2)  Aspirin 81 Mg Tbec (Aspirin) .... Take 1 Tablet By Mouth Once A Day 3)  Multivitamins  Tabs (Multiple Vitamin) .... Take 1 Tablet By Mouth Once A Day 4)  Simvastatin 40 Mg Tabs (Simvastatin) .... Take 1 Tablet By Mouth Once A Day 5)  Omeprazole 20 Mg  Cpdr (Omeprazole) .Marland Kitchen.. 1 Each Day 20-30 Min Prior To Breakfast Meal 6)  Tylenol Extra Strength 500 Mg Tabs (Acetaminophen) .... Take 2 Tablets By Mouth Three Times A Day As Needed For Pain 7)  Miralax  Powd (Polyethylene Glycol 3350) .... Mix 17g With Glass of Water and Drink Once Daily. 8)  Hydroxyzine Hcl 25 Mg/ml Soln (Hydroxyzine Hcl) .... One Tablet Every 6 Hours As Needed For Itching 9)  Ammonium Lactate 12 % Lotn (Ammonium Lactate) .... Apply To Skin Three Times A Day As Needed For Itching  Allergies (verified): 1)   Ibuprofen  Vital Signs:  Patient profile:   70 year old female Height:      63 inches Weight:      182 pounds Pulse rate:   72 / minute Pulse rhythm:   regular BP sitting:   126 / 58  (right arm)  Vitals Entered By: Lamona Curl CMA Duncan Dull) (December 03, 2009 9:36 AM)  Physical Exam  Additional Exam:  Constitutional: generally well appearing Psychiatric: alert and oriented times 3 Abdomen: soft, non-tender, non-distended, normal bowel sounds    Impression & Recommendations:  Problem # 1:  chronic constipation, intermittent rectal bleeding, FOBT positive stools she had a full colonoscopy about 3 years ago and a single small adenoma was found. I do not think she needs repeat colonoscopy at this point. She was already in our reminder system for a colonoscopy in 2013. Her FOB positive stool is most likely from constipation related straining and minor rectal bleeding. I think what I would like to do with her first is try to help her constipation and see if his bleeding resolves. She eats a lot of fiber and takes MiraLax daily without much improvement. She has to rescue with laxatives several times a week. She still has to strain a lot to move her bowels. I have given her a prescription for amitiza at 8 micrograms dosing. She will take 2 pills a day and will return to see me in 6-7 weeks. She will stay on fiber and MiraLax at the same  time.  Patient Instructions: 1)  Trial of amitiza pills, take one pill twice a day.   2)  You should begin taking citrucel powder fiber supplement (orange flavor).  Start with a small spoonful and increase this over 1 week to a full, heaping spoonful daily.  You may notice some bloating when you first start the fiber, but that usually resolves after a few days. 3)  Stay on miralax once daily. 4)  Return to see Dr. Christella Hartigan in 6 weeks to report on symptoms. 5)  The medication list was reviewed and reconciled.  All changed / newly prescribed medications  were explained.  A complete medication list was provided to the patient / caregiver. Prescriptions: AMITIZA 8 MCG  CAPS (LUBIPROSTONE) 1 two times a day/take with food and water  #60 x 2   Entered and Authorized by:   Rachael Fee MD   Signed by:   Rachael Fee MD on 12/03/2009   Method used:   Electronically to        [facility] [address] [id number]* (retail)       [address] [place], [place]  [address]       Ph: [phone]       Fax: [phone]   RxID:   [id number]

## 2010-03-22 NOTE — Assessment & Plan Note (Signed)
History of Present Illness Visit Type: consult Primary GI MD: Rob Bunting MD Primary Provider: Rufina Falco, MD Requesting Provider: Margarito Liner, MD Chief Complaint: abdominal pain History of Present Illness:     Brianna Pittman 70 year old woman who Had routine screening colonoscopy with me 09/2006, single TA found, recommended repeat colonoscopy at 5 year inteval.  Lately she's been having "not really a pain", but more of a reflux..sometimes burning in esophagus.  Sometimes food "doesn't digest", she isn't sure what she means by that.  Does have sometimes have nauseas.  This will occur 2 times a month.  For example she will feel like she is going to pass out, this is around time of nausea.  She gets LH, dizzy at the same.  She will lay down and the feeling will pass.  No chest pains or SOB at that time.  She has been taking protonix for a long time and originally this helped.  Seems like it helps.  She takes sometimes in AM, usually after breakfast.  she believes she saw a different gastroenterologist a year or 2 ago and several tests were performed. I have reviewed the chart hospital system and see no record of any upper GI type examinations, no dictated reports of any endoscopies. She does not remember the name of the gastroenterologist and does not remember exactly what they found.  She thinks her wieght is slightly up over past year (4-5 pounds).  No melena, no hematemsis.  NO dysphagia.  Takes goody's but only VERY rarely.             Prior Medications Reviewed Using: Medication Bottles  Updated Prior Medication List: NORVASC 10 MG TABS (AMLODIPINE BESYLATE) Take 1 tablet by mouth once a day ASPIRIN 81 MG TBEC (ASPIRIN) Take 1 tablet by mouth once a day PROTONIX 40 MG  TBEC (PANTOPRAZOLE SODIUM) Take 1 tablet by mouth once a day MULTIVITAMINS  TABS (MULTIPLE VITAMIN) Take 1 tablet by mouth once a day DULCOLAX 5 MG TBEC (BISACODYL) Take 1 tablet by mouth two times a day as  needed for constipation COLACE 100 MG CAPS (DOCUSATE SODIUM) Take 1 capsule by mouth two times a day METAMUCIL 30.9 % POWD (PSYLLIUM) Take 1 teaspoon mixed in 8 oz of water once daily PRAVASTATIN SODIUM 40 MG TABS (PRAVASTATIN SODIUM) take 2 tablets by mouth once daily  Current Allergies (reviewed today): No known allergies   Past Medical History:    Hyperlipidemia    Hypertension    Panic disorder    Unstable angina     ---Cath 2007: LVEF 65%, Right dominant system with patent coronary arteries    --- Holter monitor 2007: negative per clinic note    Constipation    GERD    Depression    Osteoarthritis     -- R knee meniscal repair 2005    palpitations    Hx of dizziness    blurred vision    right thumb skin lesion:  ? ganglioma    Bilateral cataracts.    colonic tubular adenoma, August 2008, repeat colonoscopy August 2013  Past Surgical History:    Appendectomy   Family History:    no colon cancer  Social History:    does not drink alcohol, does not drink caffeinated beverages.    Review of Systems       Pertinent positive and negative review of systems were noted in the above HPI and GI specific review of systems.  All other review of systems was otherwise  negative.    Vital Signs:  Patient Profile:   70 Years Old Female Height:     63 inches (160.02 cm) Weight:      176 pounds BMI:     31.29 Pulse rate:   72 / minute Pulse rhythm:   regular BP sitting:   130 / 64  (left arm) Cuff size:   regular  Vitals Entered By: Francee Piccolo CMA (January 31, 2008 9:39 AM)                  Physical Exam  Constitutional: generally well appearing Psychiatric: alert and oriented times 3 Eyes: extraocular movements intact Mouth: oropharynx moist, no lesions Neck: supple, no lymphadenopathy Cardiovascular: heart regular rate and rythm Lungs: CTA bilaterally Abdomen: soft, non-tender, non-distended, no obvious ascites, no peritoneal signs, normal bowel  sounds Extremities: no lower extremity edema bilaterally Skin: no lesions on visible extremities     Impression & Recommendations:  Problem # 1:  EPIGASTRIC DISCOMFORT (ICD-789.06) I did not mention above but she had a complete metabolic profile and sedimentation rate done 2 months ago. A complete metabolic profile is normal but sedimentation rate was up at 31. I will arrange for have a CBC done today. Her epigastric pains are quite vague and may indeed be reflux related. She does not take proton pump inhibitor at the correct time in relation to food so I recommended she modify the timing. She does not remember the name of her gastroenterologists that she saw previously. I cannot find any record in the chart system of any gastroenterology type tests besides my own colonoscopy in 2008. She will try to get records sent over she can remember the name and location of his previous tests. I think with her epigastric pains it is safe to proceed with EGD at her soonest convenience.   Patient Instructions: 1)  You should change the way you are taking your antiacid medicine (protonix) so that you are taking it 20-30 minutes prior to a decent meal as that is the way the pill is designed to work most effectively. 2)  You will be scheduled to have an upper endoscopy. 3)  You will get lab test(s) done today (cbc). 4)  You will try to locate records from your previous gastroenterology tests (we've checked hospital compute system without success). 5)  A copy of this information will be sent to Dr. Maple Hudson.    ]  Appended Document: Orders Update/EGD    Clinical Lists Changes  Orders: Added new Test order of EGD (EGD) - Signed

## 2010-03-22 NOTE — Consult Note (Signed)
Summary: [facility]   Imported By: Florinda Marker 07/08/2008 15:20:24  _____________________________________________________________________  External Attachment:    Type:   Image     Comment:   External Document

## 2010-03-22 NOTE — Assessment & Plan Note (Signed)
Summary: CHECKUP/ SB.   Vital Signs:  Patient Profile:   70 Years Old Female Height:     63 inches (160.02 cm) Weight:      171.05 pounds (77.75 kg) BMI:     30.41 Temp:     97.5 degrees F (36.39 degrees C) oral Pulse rate:   72 / minute BP sitting:   131 / 67  (right arm)  Pt. in pain?   yes    Location:   knee, rt    Intensity:   5    Type:       aching  Vitals Entered By: Angelina Ok RN (February 01, 2007 11:24 AM)              Is Patient Diabetic? No Nutritional Status BMI of > 30 = obese  Have you ever been in a relationship where you felt threatened, hurt or afraid?No   Does patient need assistance? Functional Status Self care Ambulation Normal     Chief Complaint:  Discuss meds for B/P and Cholesterol.  History of Present Illness: Pt is a 70 year old AAW with PMH significant for HTN, HLD, depression, and GERD presenting to clinic for check up.  Pt reports that on two separate occasions she was walking and had a brief episode of nausea that went away after drinking some juice.  She denied any associated chest pain, SOB, diaphoresis, syncope, cough, blurred vision, neuro changes, or any other symptoms.  Pt would also like to have her cholesterol and her LFTs checked.  She is not fasting so I will have her come back in 3-4 weeks to have them checked.  Pt denies SI/HI and says her anxiety is better.   Current Allergies (reviewed today): No known allergies     Risk Factors: Tobacco use:  never Alcohol use:  no Exercise:  yes    Times per week:  3    Type:  bike Seatbelt use:  100 %   Review of Systems       See HPI   Physical Exam  General:     alert, well-developed, well-nourished, well-hydrated, and overweight-appearing.   Head:     normocephalic and atraumatic.   Eyes:     No corneal or conjunctival inflammation noted. EOMI. Perrla. Funduscopic exam benign, without hemorrhages, exudates or papilledema. Vision grossly normal. Neck:     supple  and no carotid bruits.   Lungs:     normal respiratory effort, no intercostal retractions, no accessory muscle use, normal breath sounds, no crackles, and no wheezes.   Heart:     normal rate, regular rhythm, no murmur, no gallop, no rub, and no JVD.   Abdomen:     soft, non-tender, normal bowel sounds, no distention, no masses, no guarding, and no rigidity.   Msk:     normal ROM.   Extremities:     No clubbing, cyanosis, edema, or deformity noted with normal full range of motion of all joints.   Neurologic:     alert & oriented X3, cranial nerves II-XII intact, strength normal in all extremities, sensation intact to light touch, gait normal, and DTRs symmetrical and normal.   Skin:     turgor normal, color normal, no rashes, and no suspicious lesions.   Psych:     Oriented X3, normally interactive, good eye contact, not anxious appearing, and not depressed appearing.      Impression & Recommendations:  Problem # 1:  DEPRESSION (ICD-311) Pt is without  complaints of depression.  No change to treatment.  Her updated medication list for this problem includes:    Effexor 37.5 Mg Tabs (Venlafaxine hcl) .Marland Kitchen... Take 1 tablet by mouth once a day   Problem # 2:  GERD (ICD-530.81) Pt with no complaints of reflux symptoms.  I have refilled her protonix for her.  Her updated medication list for this problem includes:    Pantoprazole Sodium 40 Mg Tbec (Pantoprazole sodium) .Marland Kitchen... Take 1 tablet by mouth once a day   Problem # 3:  HYPERTENSION (ICD-401.9) Good control.  No change to medications at this time.  Her updated medication list for this problem includes:    Norvasc 10 Mg Tabs (Amlodipine besylate) .Marland Kitchen... Take 1 tablet by mouth once a day    Lisinopril 40 Mg Tabs (Lisinopril) .Marland Kitchen... Take 1 tablet by mouth once a day   Problem # 4:  HYPERLIPIDEMIA (ICD-272.4) I have ordered pt to have a FLP and CMET in 3-4 weeks to check on the efficacy of her pravachol and to monitor her liver enzymes.   Pt denies myalgias.  Her updated medication list for this problem includes:    Pravachol 40 Mg Tabs (Pravastatin sodium) .Marland Kitchen... 2 tabs by mouth qday  Future Orders: T-Comprehensive Metabolic Panel ([id number]) ... 02/25/2007 T-Lipid Profile [id number]) ... 02/25/2007   Problem # 5:  Preventive Health Care (ICD-V70.0) Pt continues to refuse getting her mammogram.  I have asked her to reconsider but she refuses.  Pt encouraged to continue her monthly self breast exams and to notify me of any changes.   Pt had a colonoscopy in August which was reported by pt as being normal.  I have not seen the official report yet though.   Complete Medication List: 1)  Norvasc 10 Mg Tabs (Amlodipine besylate) .... Take 1 tablet by mouth once a day 2)  Aspirin 81 Mg Tbec (Aspirin) .... Take 1 tablet by mouth once a day 3)  Colace 100 Mg Caps (Docusate sodium) .... Take 1 capsule by mouth two times a day 4)  Effexor 37.5 Mg Tabs (Venlafaxine hcl) .... Take 1 tablet by mouth once a day 5)  Lisinopril 40 Mg Tabs (Lisinopril) .... Take 1 tablet by mouth once a day 6)  Dulcolax 5 Mg Tbec (Bisacodyl) .... Take 1 tablet by mouth two times a day as needed for constipation 7)  Metamucil 30.9 % Powd (Psyllium) .... Take 1 teaspoon mixed in 8 oz of water once daily 8)  Pravachol 40 Mg Tabs (Pravastatin sodium) .... 2 tabs by mouth qday 9)  Pantoprazole Sodium 40 Mg Tbec (Pantoprazole sodium) .... Take 1 tablet by mouth once a day   Patient Instructions: 1)  Please schedule a follow-up appointment in 3-4 weeks to have your liver enzymes and cholesterol checked.  You will need to be fasting prior to the appointment.  2)  Do not eat anything after midnight on the night prior to your appointment.  3)  Please reconsider getting your mammogram.     Prescriptions: PANTOPRAZOLE SODIUM 40 MG  TBEC (PANTOPRAZOLE SODIUM) Take 1 tablet by mouth once a day  #30 x 5   Entered and Authorized by:   Rufina Falco MD   Signed  by:   Rufina Falco MD on 02/01/2007   Method used:   Print then Give to Patient   RxID:   [id number] LISINOPRIL 40 MG TABS (LISINOPRIL) Take 1 tablet by mouth once a day  #30 x 5   Entered and  Authorized by:   Rufina Falco MD   Signed by:   Rufina Falco MD on 02/01/2007   Method used:   Print then Give to Patient   RxID:   [id number] NORVASC 10 MG TABS (AMLODIPINE BESYLATE) Take 1 tablet by mouth once a day  #30 x 5   Entered and Authorized by:   Rufina Falco MD   Signed by:   Rufina Falco MD on 02/01/2007   Method used:   Print then Give to Patient   RxID:   [id number]  ]

## 2010-03-22 NOTE — Assessment & Plan Note (Signed)
Summary: ear pain/gg   Vital Signs:  Patient profile:   70 year old Pittman Height:      63 inches (160.02 cm) Weight:      178 pounds (80.91 kg) BMI:     31.65 Temp:     97.8 degrees F (36.56 degrees C) oral Pulse rate:   67 / minute BP sitting:   125 / 67  (left arm)  Vitals Entered By: Angelina Ok RN (June 03, 2008 2:30 PM) CC: Depression Is Patient Diabetic? No Pain Assessment Patient in pain? no      Nutritional Status BMI of > 30 = obese  Have you ever been in a relationship where you felt threatened, hurt or afraid?No   Does patient need assistance? Functional Status Self care Comments Pain in her ears.  Pain in right foot left has pain and feels heavy today.  Getting ready for surgery on Monday for eyes.   Primary Care Provider:  Rufina Falco, MD  CC:  Depression.  History of Present Illness: Pt is a 70 year old woman with PMH signficant for anemia, osteoarthritis, GERD, depression, Panic disorder, constipation, HTN, tinnitus of left ear in past, ear infection of left ear in December of 2009, and HLD who presents to clinic for check up.  Today pt reports that since she had an ear infection back in December she has been having intermitient hearing loss, ringing in her ears, and now with mild left ear pain.  She reports that she was treated at that time with an oral antibiotic but did not know the name.  She also reports that she had a similar presentation many years ago which she was given an antibiotic for.  She denies vertigo and current right ear problems.  Pt also reports that she is having right knee pain that shoots down to her foot.  She denies right hip pain at this time as well as back pain.  She also denies numbness or tingling in right leg or neuro dysfunction.  Pt reports that everything else is going good.   Depression History:      The patient denies a depressed mood most of the day and a diminished interest in her usual daily activities.          Preventive Screening-Counseling & Management     Smoking Status: never     Does Patient Exercise: yes     Type of exercise: bike - sometime; weighs     Times/week: 3  Current Medications (verified): 1)  Norvasc 10 Mg Tabs (Amlodipine Besylate) .... Take 1 Tablet By Mouth Once A Day 2)  Aspirin 81 Mg Tbec (Aspirin) .... Take 1 Tablet By Mouth Once A Day 3)  Multivitamins  Tabs (Multiple Vitamin) .... Take 1 Tablet By Mouth Once A Day 4)  Dulcolax 5 Mg Tbec (Bisacodyl) .... Take 1 Tablet By Mouth Two Times A Day As Needed For Constipation 5)  Colace 100 Mg Caps (Docusate Sodium) .... Take 1 Capsule By Mouth Two Times A Day 6)  Metamucil 30.9 % Powd (Psyllium) .... Take 1 Teaspoon Mixed in 8 Oz of Water Once Daily 7)  Pravastatin Sodium 40 Mg Tabs (Pravastatin Sodium) .... Take 2 Tablets By Mouth Once Daily 8)  Ferrous Sulfate 325 (65 Fe) Mg Tabs (Ferrous Sulfate) .... Take 1 Tablet By Mouth Two Times A Day 9)  Omeprazole 20 Mg  Cpdr (Omeprazole) .Marland Kitchen.. 1 Each Day 20-30 Min Prior To Breakfast Meal  Allergies (verified): No Known Drug  Allergies  Review of Systems  The patient denies fever, chest pain, syncope, dyspnea on exertion, prolonged cough, abdominal pain, melena, hematochezia, and severe indigestion/heartburn.         SEE HPI  Physical Exam  General:  alert, well-developed, well-nourished, well-hydrated, and overweight-appearing.   Head:  normocephalic and atraumatic.   Ears:  R ear normal.   Left ear with minimal erythema and is tender with ear exam.  No exudates, normal TM.   Mouth:  pharynx pink and moist.   Neck:  supple.   Lungs:  normal respiratory effort, no intercostal retractions, no accessory muscle use, normal breath sounds, no crackles, and no wheezes.   Heart:  normal rate, regular rhythm, no gallop, no rub, no JVD, and Grade   2/6 systolic ejection murmur.   Abdomen:  soft, non-tender, and normal bowel sounds.   Msk:  normal ROM, no joint tenderness, no joint  swelling, no joint warmth, no redness over joints, no joint deformities, no joint instability, and no crepitation.   Extremities:  Mild bilateral trace pedal edema.  Neurologic:  alert & oriented X3.   Skin:  color normal, no rashes, no suspicious lesions, and no edema.   Psych:  Oriented X3, normally interactive, good eye contact, not anxious appearing, and not depressed appearing.     Impression & Recommendations:  Problem # 1:  UNSPECIFIED HEARING LOSS (ICD-389.9) Will treat with some amoxicillin.  Minimal inflammation at this time.  Willl send to ENT for tinnitus and intermitent hearing loss.  No wax in either ear.   Orders: ENT Referral (ENT)  Problem # 2:  OSTEOARTHRITIS (ICD-715.90) Will get an xray of right knee and right hip.  Will try ibuprofen for her pain.  No fluid in her knee to drain and minimal tenderness to palpation.  Likely her arthritis pain but willl look for referred hip Pain.  Her updated medication list for this problem includes:    Aspirin 81 Mg Tbec (Aspirin) .Marland Kitchen... Take 1 tablet by mouth once a day    Ibuprofen 400 Mg Tabs (Ibuprofen) .Marland Kitchen... Take 1 tablet by mouth three times a day as needed for pain  Orders: Radiology other (Radiology Other)  Problem # 3:  DEPRESSION (ICD-311) Stable.   Problem # 4:  GERD (ICD-530.81) Stable.  No complaints.  Continue omeprazole.  Her updated medication list for this problem includes:    Omeprazole 20 Mg Cpdr (Omeprazole) .Marland Kitchen... 1 each day 20-30 min prior to breakfast meal  Problem # 5:  HYPERTENSION (ICD-401.9) Well controlled.  No change to medications.  Her updated medication list for this problem includes:    Norvasc 10 Mg Tabs (Amlodipine besylate) .Marland Kitchen... Take 1 tablet by mouth once a day  Complete Medication List: 1)  Norvasc 10 Mg Tabs (Amlodipine besylate) .... Take 1 tablet by mouth once a day 2)  Aspirin 81 Mg Tbec (Aspirin) .... Take 1 tablet by mouth once a day 3)  Multivitamins Tabs (Multiple vitamin) ....  Take 1 tablet by mouth once a day 4)  Dulcolax 5 Mg Tbec (Bisacodyl) .... Take 1 tablet by mouth two times a day as needed for constipation 5)  Colace 100 Mg Caps (Docusate sodium) .... Take 1 capsule by mouth two times a day 6)  Metamucil 30.9 % Powd (Psyllium) .... Take 1 teaspoon mixed in 8 oz of water once daily 7)  Pravastatin Sodium 40 Mg Tabs (Pravastatin sodium) .... Take 2 tablets by mouth once daily 8)  Ferrous Sulfate 325 (  65 Fe) Mg Tabs (Ferrous sulfate) .... Take 1 tablet by mouth two times a day 9)  Omeprazole 20 Mg Cpdr (Omeprazole) .Marland Kitchen.. 1 each day 20-30 min prior to breakfast meal 10)  Amoxil 500 Mg Caps (Amoxicillin) .... Take 1 tablet by mouth two times a day for 7 days. 11)  Ibuprofen 400 Mg Tabs (Ibuprofen) .... Take 1 tablet by mouth three times a day as needed for pain  Patient Instructions: 1)  Please schedule a follow-up appointment in 3 months. 2)  If your pain continues in your left ear call the clinic.   3)  I will call you with any abnormal xray findings that we find.   4)  Take ibuprofen 400mg  every 8 hours as needed for pain.  Make sure you take your protonix everyday. 5)  Please finish the entire course of the amoxicillin.  Prescriptions: IBUPROFEN 400 MG TABS (IBUPROFEN) Take 1 tablet by mouth three times a day as needed for pain  #100 x 2   Entered and Authorized by:   Rufina Falco MD   Signed by:   Rufina Falco MD on 06/03/2008   Method used:   Electronically to        [facility] [address] [id number]* (retail)       [address] [place], [place]  [address]       Ph: [phone]       Fax: [phone]   RxID:   [id number] AMOXIL 500 MG CAPS (AMOXICILLIN) Take 1 tablet by mouth two times a day for 7 days.  #14 x 0   Entered and Authorized by:   Rufina Falco MD   Signed by:   Rufina Falco MD on 06/03/2008   Method used:   Electronically to        [facility] [address] [id number]* (retail)       [address] [place], [place]  [address]        Ph: [phone]       Fax: [phone]   RxID:   [id number]

## 2010-03-22 NOTE — Assessment & Plan Note (Signed)
Summary: f/u on labs/gg  per dr cykert   Vital Signs:  Patient profile:   70 year old female Height:      63 inches (160.02 cm) Weight:      174.4 pounds (79.27 kg) BMI:     31.01 Temp:     97.9 degrees F (36.61 degrees C) oral Pulse rate:   88 / minute BP sitting:   142 / 75  (left arm)  Vitals Entered By: Chinita Pester RN (August 10, 2008 9:47 AM) CC: Fasting labs - but pt. has eaten, Depression Is Patient Diabetic? No Pain Assessment Patient in pain? no      Nutritional Status BMI of > 30 = obese  Have you ever been in a relationship where you felt threatened, hurt or afraid?No   Does patient need assistance? Functional Status Self care Ambulation Normal   Primary Care Provider:  Rufina Falco, MD  CC:  Fasting labs - but pt. has eaten and Depression.  History of Present Illness: Pt is a 70 year old woman who presents to the clinic for two week follow up for depression, myalgias, exertional SOB, and insomnia.  Pt reports that she is feeling much better after starting the amitriptyline and the ambien.  She no longer is having myalgias or weakness.  She has cut back on her water consumption and is now not waking up every couple of hours during the night.  She has continued taking the pravachol so I do not think this was the cause of her above problems.  Her mood is also much better.  She did not go to the 2 D echo appt so this will be reschedule today.  She is also going to get her MRI rescheduled with her ENT doctor.  Of note pt had a mildly elevated ESR, mildly elevated total CK level, and a low Vitamin D level with her last labs two weeks ago.   Depression History:      The patient denies a depressed mood most of the day and a diminished interest in her usual daily activities.        Comments:  States sleeping better.   Preventive Screening-Counseling & Management  Alcohol-Tobacco     Alcohol drinks/day: 0     Smoking Status: never  Caffeine-Diet-Exercise     Does  Patient Exercise: yes     Type of exercise: bike - sometime; weighs     Times/week: 1-2  Current Medications (verified): 1)  Norvasc 10 Mg Tabs (Amlodipine Besylate) .... Take 1 Tablet By Mouth Once A Day 2)  Aspirin 81 Mg Tbec (Aspirin) .... Take 1 Tablet By Mouth Once A Day 3)  Multivitamins  Tabs (Multiple Vitamin) .... Take 1 Tablet By Mouth Once A Day 4)  Dulcolax 5 Mg Tbec (Bisacodyl) .... Take 1 Tablet By Mouth Two Times A Day As Needed For Constipation 5)  Colace 100 Mg Caps (Docusate Sodium) .... Take 1 Capsule By Mouth Two Times A Day 6)  Metamucil 30.9 % Powd (Psyllium) .... Take 1 Teaspoon Mixed in 8 Oz of Water Once Daily 7)  Pravastatin Sodium 40 Mg Tabs (Pravastatin Sodium) .... Take 2 Tablets By Mouth Once Daily 8)  Ferrous Sulfate 325 (65 Fe) Mg Tabs (Ferrous Sulfate) .... Take 1 Tablet By Mouth Two Times A Day 9)  Omeprazole 20 Mg  Cpdr (Omeprazole) .Marland Kitchen.. 1 Each Day 20-30 Min Prior To Breakfast Meal 10)  Amoxil 500 Mg Caps (Amoxicillin) .... Take 1 Tablet By Mouth Two  Times A Day For 7 Days. 11)  Ibuprofen 400 Mg Tabs (Ibuprofen) .... Take 1 Tablet By Mouth Three Times A Day As Needed For Pain 12)  Ambien 5 Mg Tabs (Zolpidem Tartrate) .... Take One Pill Each Night 13)  Amitriptyline Hcl 25 Mg Tabs (Amitriptyline Hcl) .... Take One Pill Each Night  Allergies (verified): 1)  ! Ibuprofen  Past History:  Past Medical History: Last updated: 02/07/2008 Hyperlipidemia Hypertension Panic disorder Unstable angina  ---Cath 2007: LVEF 65%, Right dominant system with patent coronary arteries --- Holter monitor 2007: negative per clinic note Constipation GERD Depression Osteoarthritis  -- R knee meniscal repair 2005 palpitations Hx of dizziness Anemia blurred vision right thumb skin lesion:  ? ganglioma Bilateral cataracts. colonic tubular adenoma, August 2008, repeat colonoscopy August 2013  Past Surgical History: Last updated: 01/31/2008 Appendectomy  Family  History: Last updated: 08/10/2008 no colon cancer  Social History: Last updated: 01/31/2008 does not drink alcohol, does not drink caffeinated beverages.  Risk Factors: Alcohol Use: 0 (08/10/2008) Exercise: yes (08/10/2008)  Risk Factors: Smoking Status: never (08/10/2008)  Family History: no colon cancer  Review of Systems  The patient denies fever, chest pain, syncope, peripheral edema, abdominal pain, melena, hematochezia, hematuria, muscle weakness, difficulty walking, and depression.    Physical Exam  General:  alert, well-developed, well-nourished, well-hydrated, and overweight-appearing.   Head:  normocephalic and atraumatic.   Mouth:  pharynx pink and moist.   Lungs:  normal respiratory effort, no intercostal retractions, no accessory muscle use, normal breath sounds, no crackles, and no wheezes.   Heart:  normal rate, regular rhythm, no murmur, no gallop, no rub, and no JVD.   Abdomen:  soft, non-tender, and normal bowel sounds.   Extremities:  No clubbing, cyanosis, edema, or deformity noted with normal full range of motion of all joints.   Neurologic:  alert & oriented X3, cranial nerves II-XII intact, strength normal in all extremities, sensation intact to light touch, and gait normal.   Skin:  color normal and no edema.   Psych:  Oriented X3, normally interactive, good eye contact, not anxious appearing, not depressed appearing, and not agitated.  Pts affect is much better today.    Impression & Recommendations:  Problem # 1:  VITAMIN D DEFICIENCY (ICD-268.9) Will start her on Calcium and vitamin D two times a day and will need to recheck her level in a couple of months.   Problem # 2:  INSOMNIA (ICD-780.52) Pt reports she is sleeping much better with the ambien and amitriptyline.  She is also not urinating as frequently with the decreased water consumption.   Her updated medication list for this problem includes:    Ambien 5 Mg Tabs (Zolpidem tartrate) .Marland Kitchen...  Take one pill each night  Problem # 3:  MYALGIA (ICD-729.1) Resolved.  Was likely secondary to poor sleep.  Will repeat the total CK level and the ESR.  Will continue the statin as her myalgias resolved despite continuing the statin.  Her updated medication list for this problem includes:    Aspirin 81 Mg Tbec (Aspirin) .Marland Kitchen... Take 1 tablet by mouth once a day    Ibuprofen 400 Mg Tabs (Ibuprofen) .Marland Kitchen... Take 1 tablet by mouth three times a day as needed for pain  Orders: T-CK Total [phone]) T-Sed Rate (Automated) ([id number])  Problem # 4:  DYSPNEA ON EXERTION (ICD-786.09) Will reschedule her 2 D echo.    Problem # 5:  UNSPECIFIED HEARING LOSS (ICD-389.9) Pt to reschedule her MRI and  follow up with ENT.   Problem # 6:  DEPRESSION (ICD-311) Improved.  Will continue the amitriptyline.   Her updated medication list for this problem includes:    Amitriptyline Hcl 25 Mg Tabs (Amitriptyline hcl) .Marland Kitchen... Take one pill each night  Complete Medication List: 1)  Norvasc 10 Mg Tabs (Amlodipine besylate) .... Take 1 tablet by mouth once a day 2)  Aspirin 81 Mg Tbec (Aspirin) .... Take 1 tablet by mouth once a day 3)  Multivitamins Tabs (Multiple vitamin) .... Take 1 tablet by mouth once a day 4)  Dulcolax 5 Mg Tbec (Bisacodyl) .... Take 1 tablet by mouth two times a day as needed for constipation 5)  Colace 100 Mg Caps (Docusate sodium) .... Take 1 capsule by mouth two times a day 6)  Metamucil 30.9 % Powd (Psyllium) .... Take 1 teaspoon mixed in 8 oz of water once daily 7)  Pravastatin Sodium 40 Mg Tabs (Pravastatin sodium) .... Take 2 tablets by mouth once daily 8)  Ferrous Sulfate 325 (65 Fe) Mg Tabs (Ferrous sulfate) .... Take 1 tablet by mouth two times a day 9)  Omeprazole 20 Mg Cpdr (Omeprazole) .Marland Kitchen.. 1 each day 20-30 min prior to breakfast meal 10)  Amoxil 500 Mg Caps (Amoxicillin) .... Take 1 tablet by mouth two times a day for 7 days. 11)  Ibuprofen 400 Mg Tabs (Ibuprofen) .... Take  1 tablet by mouth three times a day as needed for pain 12)  Ambien 5 Mg Tabs (Zolpidem tartrate) .... Take one pill each night 13)  Amitriptyline Hcl 25 Mg Tabs (Amitriptyline hcl) .... Take one pill each night 14)  Oscal 500/200 D-3 500-200 Mg-unit Tabs (Calcium-vitamin d) .... Take 1 tablet by mouth two times a day  Patient Instructions: 1)  Please schedule a follow-up appointment in 3 months. 2)  I will call you with any abnormal lab values.  3)  Call the clinic if you have any concerns or questions.  Good luck to you and it was a pleasure being your doctor. Prescriptions: OSCAL 500/200 D-3 500-200 MG-UNIT TABS (CALCIUM-VITAMIN D) Take 1 tablet by mouth two times a day  #60 x 11   Entered and Authorized by:   Rufina Falco MD   Signed by:   Rufina Falco MD on 08/10/2008   Method used:   Electronically to        [facility] [id number]* (retail)       [address] [place], [place]  [address]       Ph: [phone]       Fax: [phone]   RxID:   [id number]   Prevention & Chronic Care Immunizations   Influenza vaccine: Fluvax Non-MCR  (12/28/2006)    Tetanus booster: Not documented    Pneumococcal vaccine: Not documented    H. zoster vaccine: Not documented  Colorectal Screening   Hemoccult: Negative  (10/20/2003)    Colonoscopy: Results: Polyp.  Pt with polyp in transverse colon  (10/03/2006)   Colonoscopy action/deferral: Repeat colonoscopy in 5 years.   (10/03/2006)  Other Screening   Pap smear: Not documented    Mammogram: No specific mammographic evidence of malignancy.  Assessment: BIRADS 1.   (09/11/2007)   Mammogram action/deferral: Screening mammogram in 1 year.     (09/11/2007)   Mammogram due: 09/2008    DXA bone density scan: Not documented    Smoking status: never  (08/10/2008)  Hypertension   Last Blood Pressure: 142 /  75  (08/10/2008)   Serum creatinine: 0.93  (07/29/2008)   Serum potassium 4.2  (07/29/2008)  Lipids   Total  Cholesterol: 219  (09/11/2007)   LDL: 146  (09/11/2007)   LDL Direct: Not documented   HDL: 58  (09/11/2007)   Triglycerides: 73  (09/11/2007)    SGOT (AST): 21  (07/29/2008)   SGPT (ALT): 17  (07/29/2008)   Alkaline phosphatase: 97  (07/29/2008)   Total bilirubin: 0.4  (07/29/2008)  Self-Management Support :    Hypertension self-management support: Not documented    Lipid self-management support: Not documented

## 2010-03-22 NOTE — Assessment & Plan Note (Signed)
Summary: FU VISIT/VS   Vital Signs:  Patient Profile:   70 Years Old Female Height:     63 inches (160.02 cm) Weight:      175.1 pounds (79.59 kg) BMI:     31.13 Temp:     97.7 degrees F (36.50 degrees C) oral Pulse rate:   73 / minute BP sitting:   142 / 69  (right arm)  Pt. in pain?   no  Vitals Entered By: Krystal Eaton Duncan Dull) (Jul 12, 2006 9:17 AM)              Is Patient Diabetic? No Nutritional Status BMI of > 30 = obese  Have you ever been in a relationship where you felt threatened, hurt or afraid?No   Does patient need assistance? Functional Status Self care Ambulation Normal  Prescriptions: PROTONIX 40 MG TBEC (PANTOPRAZOLE SODIUM) Take 1 tablet by mouth once a day  #30 x 5   Entered and Authorized by:   Rufina Falco MD   Signed by:   Rufina Falco MD on 07/12/2006   Method used:   Print then Give to Patient   RxID:   [id number] PRAVACHOL 80 MG TABS (PRAVASTATIN SODIUM) Take 1 tablet by mouth once a day  #30 x 5   Entered and Authorized by:   Rufina Falco MD   Signed by:   Rufina Falco MD on 07/12/2006   Method used:   Print then Give to Patient   RxID:   [id number] LISINOPRIL 40 MG TABS (LISINOPRIL) Take 1 tablet by mouth once a day  #30 x 5   Entered and Authorized by:   Rufina Falco MD   Signed by:   Rufina Falco MD on 07/12/2006   Method used:   Print then Give to Patient   RxID:   [id number] NORVASC 10 MG TABS (AMLODIPINE BESYLATE) Take 1 tablet by mouth once a day  #30 x 5   Entered and Authorized by:   Rufina Falco MD   Signed by:   Rufina Falco MD on 07/12/2006   Method used:   Print then Give to Patient   RxID:   [id number]    Chief Complaint:  med refill, requests referral for colonoscopy, and lab results from last visit.  History of Present Illness: Pt is a 70 year old AAW with PMH significant for hypertension, hyperlipidemia, anxiety, and panic disorder presenting to clinic for lab follow up.  Pt would also like to  have a handicap placard secondary to her knees causing her severe pain with ambulation.  Pt denies complaints other that the knee pain.  Pt would also like to get a colonoscopy because it has been more than ten years since her last one and she can now afford it.     Current Allergies (reviewed today): No known allergies     Risk Factors: Tobacco use:  never Alcohol use:  no Exercise:  yes    Times per week:  3    Type:  bike Seatbelt use:  100 %   Review of Systems       See HPI   Physical Exam  General:     alert, well-developed, well-nourished, and well-hydrated.   Head:     normocephalic and atraumatic.   Neck:     supple.   Lungs:     normal respiratory effort, no accessory muscle use, normal breath sounds, no crackles, and no wheezes.   Heart:     normal rate and regular  rhythm.   Abdomen:     soft, non-tender, and normal bowel sounds.   Msk:     normal ROM.  Pt with bilateral knee tenderness  Extremities:     No clubbing, cyanosis, edema, or deformity noted with normal full range of motion of all joints.   Neurologic:     alert & oriented X3.   Skin:     turgor normal, color normal, and no rashes.   Psych:     Oriented X3 and moderately anxious.      Impression & Recommendations:  Problem # 1:  HYPERTENSION (ICD-401.9) Pt reports that her blood pressure has been elevated when she checks it.  Her blood pressure today is 142/69.  I will increase her lisinopril to 40 mg.  Will reevaluate at follow up.  Her updated medication list for this problem includes:    Norvasc 10 Mg Tabs (Amlodipine besylate) .Marland Kitchen... Take 1 tablet by mouth once a day    Lisinopril 40 Mg Tabs (Lisinopril) .Marland Kitchen... Take 1 tablet by mouth once a day   Problem # 2:  MAMMOGRAM, ABNORMAL, HX OF (ICD-V15.9) Pt was initially refusing to get a mammogram secondary to the discomfort, but she agreed to get one done with some persuation.  She was reminded of her past abnormal exam.  Will schedule  one for her.    Problem # 3:  GERD (ICD-530.81) Pt would like to go back to the protonix secondary to the prilosec not working for her.  I will give her a prescription for the protonix and I filled out the necessary paperwork for her insurance to let her get the protonix.   The following medications were removed from the medication list:    Prilosec 20 Mg Cpdr (Omeprazole) .Marland Kitchen... Take 1 tablet by mouth three times a day  Her updated medication list for this problem includes:    Protonix 40 Mg Tbec (Pantoprazole sodium) .Marland Kitchen... Take 1 tablet by mouth once a day   Problem # 4:  HYPERLIPIDEMIA (ICD-272.4) Pt's last FLP profile revealed that her LDL was still in the 120's.  Her total cholesterol was under 200.  I have increased her pravachol to 80 mg by mouth q daily.  Will recheck her LFT's and a FLP at follow up appointment in two months.    Her updated medication list for this problem includes:    Pravachol 80 Mg Tabs (Pravastatin sodium) .Marland Kitchen... Take 1 tablet by mouth once a day  Future Orders: T-Comprehensive Metabolic Panel ([id number]) ... 09/13/2006 T-Lipid Profile [id number]) ... 09/13/2006   Problem # 5:  OSTEOARTHRITIS (ICD-715.90) I have filled out paperwork to get Mrs. Hyman a handicap placard secondary to the pain she is having with ambulation.   Her updated medication list for this problem includes:    Aspirin 81 Mg Tbec (Aspirin) .Marland Kitchen... Take 1 tablet by mouth once a day   Medications Added to Medication List This Visit: 1)  Lisinopril 40 Mg Tabs (Lisinopril) .... Take 1 tablet by mouth once a day 2)  Pravachol 80 Mg Tabs (Pravastatin sodium) .... Take 1 tablet by mouth once a day 3)  Protonix 40 Mg Tbec (Pantoprazole sodium) .... Take 1 tablet by mouth once a day  Other Orders: Gastroenterology Referral (GI) Mammogram (Mammogram)   Patient Instructions: 1)  Please schedule a follow-up appointment in 2 months. 2)  We will check your cholesterol again in 2 months and  will also check your liver enzymes.  Please fast prior to getting  your labs done.   3)  Please reconsider getting your mammogram.

## 2010-03-22 NOTE — Assessment & Plan Note (Signed)
Summary: est-ck/fu/meds/cfb   Vital Signs:  Patient profile:   70 year old female Height:      63 inches (160.02 cm) Weight:      178.2 pounds (81.00 kg) BMI:     31.68 Temp:     98.4 degrees F (36.89 degrees C) oral Pulse rate:   73 / minute BP sitting:   134 / 71  (right arm) Cuff size:   large  Vitals Entered By: Cynda Familia Duncan Dull) (August 02, 2009 10:51 AM) CC: pt c/o skin itching-mostly at night, feels like something biting her, med refill Is Patient Diabetic? No Pain Assessment Patient in pain? no      Nutritional Status BMI of > 30 = obese  Have you ever been in a relationship where you felt threatened, hurt or afraid?No   Does patient need assistance? Functional Status Self care Ambulation Normal   Primary Care Provider:  Darnelle Maffucci MD  CC:  pt c/o skin itching-mostly at night, feels like something biting her, and med refill.  History of Present Illness: Pt is a 71 yo female w/ past med hx below here for routine f/u.  She notes the arthritis in her knees is better w/ tylenol.  She denies falls and memory troubles.  She has not taken the new omeprazole tablets b/c they are green and she is suspicious that they aren't as good as the name brand.  She notes "I feel great."   c/o of itching when laying in bed, or sitting on couch. this have been ongoing for several months now, and patient does not have any visible rash from this.   Also c/o constipation, which has also been ongoing.  Has ran out of her statins, would like refill.   She denies any CP,SOB,cough,AP,N,V,diarrhea or any other complaints.   Depression History:      The patient denies a depressed mood most of the day and a diminished interest in her usual daily activities.        The patient denies that she feels like life is not worth living, denies that she wishes that she were dead, and denies that she has thought about ending her life.        Comments:  "sometimes I get  depressed".   Preventive Screening-Counseling & Management  Alcohol-Tobacco     Alcohol drinks/day: 0     Smoking Status: never  Current Medications (verified): 1)  Norvasc 10 Mg Tabs (Amlodipine Besylate) .... Take 1 Tablet By Mouth Once A Day 2)  Aspirin 81 Mg Tbec (Aspirin) .... Take 1 Tablet By Mouth Once A Day 3)  Multivitamins  Tabs (Multiple Vitamin) .... Take 1 Tablet By Mouth Once A Day 4)  Metamucil 30.9 % Powd (Psyllium) .... Take 1 Teaspoon Mixed in 8 Oz of Water Once Daily 5)  Simvastatin 40 Mg Tabs (Simvastatin) .... Take 1 Tablet By Mouth Once A Day 6)  Omeprazole 20 Mg  Cpdr (Omeprazole) .Marland Kitchen.. 1 Each Day 20-30 Min Prior To Breakfast Meal 7)  Oscal 500/200 D-3 500-200 Mg-Unit Tabs (Calcium-Vitamin D) .... Take 1 Tablet By Mouth Two Times A Day 8)  Tylenol Extra Strength 500 Mg Tabs (Acetaminophen) .... Take 2 Tablets By Mouth Three Times A Day As Needed For Pain 9)  Proventil Hfa 108 (90 Base) Mcg/act Aers (Albuterol Sulfate) .... Take 1-2 Puffs Every 4 Hours As Needed For Shortness of Breath and Wheezing. 10)  Ferrous Sulfate 325 (65 Fe) Mg Tabs (Ferrous Sulfate) .... Take 1 Tablet  By Mouth Two Times A Day 11)  Miralax  Powd (Polyethylene Glycol 3350) .... Mix 17g With Glass of Water and Drink Once Daily. 12)  Hydroxyzine Hcl 25 Mg/ml Soln (Hydroxyzine Hcl) .... One Tablet Every 6 Hours As Needed For Itching 13)  Ammonium Lactate 12 % Lotn (Ammonium Lactate) .... Apply To Skin Three Times A Day As Needed For Itching  Allergies: 1)  Ibuprofen  Review of Systems       Per HPI  Physical Exam  General:  alert, pleasant, well groomed, no distress.  Lungs:  Normal respiratory effort, chest expands symmetrically. Lungs are clear to auscultation, no crackles or wheezes. Heart:  Normal rate and regular rhythm. Abdomen:  +BS's, soft, NT and ND. Msk:  no joint swelling, no joint warmth, and no redness over joints.    Neurologic:  alert & oriented X3, cranial nerves II-XII  intact, strength normal in all extremities, sensation intact to light touch, and gait normal.     Skin:   turgor normal and no rashes.    Impression & Recommendations:  Problem # 1:  HYPERLIPIDEMIA (ICD-272.4) Assessment Comment Only  LDL remains high on pravastatin, will change to simvastatin today and bring back in 1 month to recheck FLP and LFT.   Her updated medication list for this problem includes:    Simvastatin 40 Mg Tabs (Simvastatin) .Marland Kitchen... Take 1 tablet by mouth once a day  Labs Reviewed: SGOT: 18 (04/14/2009)   SGPT: 16 (04/14/2009)   HDL:63 (04/14/2009), 67 (11/23/2008)  LDL:159 (04/14/2009), 164 (11/23/2008)  Chol:239 (04/14/2009), 247 (11/23/2008)  Trig:84 (04/14/2009), 79 (11/23/2008)  Problem # 2:  CONSTIPATION (ICD-564.00) Assessment: Comment Only  will give trial of miralax.   Her updated medication list for this problem includes:    Metamucil 30.9 % Powd (Psyllium) .Marland Kitchen... Take 1 teaspoon mixed in 8 oz of water once daily    Miralax Powd (Polyethylene glycol 3350) ..... Mix 17g with glass of water and drink once daily.  Discussed dietary fiber measures and increased water intake.   Problem # 3:  HYPERTENSION (ICD-401.9) Assessment: Comment Only  Well controlled on current treatment, No new changes made today, Will continue to monitor.   Her updated medication list for this problem includes:    Norvasc 10 Mg Tabs (Amlodipine besylate) .Marland Kitchen... Take 1 tablet by mouth once a day  BP today: 134/71 Prior BP: 122/68 (04/14/2009)  Labs Reviewed: K+: 4.4 (04/14/2009) Creat: : 0.84 (04/14/2009)   Chol: 239 (04/14/2009)   HDL: 63 (04/14/2009)   LDL: 159 (04/14/2009)   TG: 84 (04/14/2009)  Problem # 4:  PRURITUS (ICD-698.9) Assessment: Comment Only ongoing for several months now, pt thinks its bed bugs, however she has not seen any. the itching maybe related to dry skin, will give hydroxyzine and topical cream.   Complete Medication List: 1)  Norvasc 10 Mg Tabs  (Amlodipine besylate) .... Take 1 tablet by mouth once a day 2)  Aspirin 81 Mg Tbec (Aspirin) .... Take 1 tablet by mouth once a day 3)  Multivitamins Tabs (Multiple vitamin) .... Take 1 tablet by mouth once a day 4)  Metamucil 30.9 % Powd (Psyllium) .... Take 1 teaspoon mixed in 8 oz of water once daily 5)  Simvastatin 40 Mg Tabs (Simvastatin) .... Take 1 tablet by mouth once a day 6)  Omeprazole 20 Mg Cpdr (Omeprazole) .Marland Kitchen.. 1 each day 20-30 min prior to breakfast meal 7)  Oscal 500/200 D-3 500-200 Mg-unit Tabs (Calcium-vitamin d) .... Take 1 tablet  by mouth two times a day 8)  Tylenol Extra Strength 500 Mg Tabs (Acetaminophen) .... Take 2 tablets by mouth three times a day as needed for pain 9)  Proventil Hfa 108 (90 Base) Mcg/act Aers (Albuterol sulfate) .... Take 1-2 puffs every 4 hours as needed for shortness of breath and wheezing. 10)  Ferrous Sulfate 325 (65 Fe) Mg Tabs (Ferrous sulfate) .... Take 1 tablet by mouth two times a day 11)  Miralax Powd (Polyethylene glycol 3350) .... Mix 17g with glass of water and drink once daily. 12)  Hydroxyzine Hcl 25 Mg/ml Soln (Hydroxyzine hcl) .... One tablet every 6 hours as needed for itching 13)  Ammonium Lactate 12 % Lotn (Ammonium lactate) .... Apply to skin three times a day as needed for itching  Patient Instructions: 1)  Please schedule a follow-up appointment in 1 month for a cholesterol check and liver function tests.  Prescriptions: AMMONIUM LACTATE 12 % LOTN (AMMONIUM LACTATE) Apply to skin three times a day as needed for itching  #1 bottle x 3   Entered by:   Julaine Fusi  DO   Authorized by:   Darnelle Maffucci MD   Signed by:   Darnelle Maffucci MD on 08/02/2009   Method used:   Electronically to        [facility] #[id number]* (retail)       [address] [place], [place]  [address]       Ph: [phone]       Fax: [phone]   RxID:   [id number] HYDROXYZINE HCL 25 MG/ML SOLN (HYDROXYZINE HCL) one tablet every 6  hours as needed for itching  #90 x 0   Entered and Authorized by:   Darnelle Maffucci MD   Signed by:   Darnelle Maffucci MD on 08/02/2009   Method used:   Electronically to        [facility] #[id number]* (retail)       [address] [place], [place]  [address]       Ph: [phone]       Fax: [phone]   RxID:   [id number] MIRALAX  POWD (POLYETHYLENE GLYCOL 3350) mix 17g with glass of water and drink once daily.  #30 x 0   Entered and Authorized by:   Darnelle Maffucci MD   Signed by:   Darnelle Maffucci MD on 08/02/2009   Method used:   Electronically to        [facility] #[id number]* (retail)       [address] [place], [place]  [address]       Ph: [phone]       Fax: [phone]   RxID:   [id number] SIMVASTATIN 40 MG TABS (SIMVASTATIN) Take 1 tablet by mouth once a day  #30 x 3   Entered and Authorized by:   Darnelle Maffucci MD   Signed by:   Darnelle Maffucci MD on 08/02/2009   Method used:   Electronically to        [facility] #[id number]* (retail)       [address] [place], [place]  [address]       Ph: [phone]       Fax: [phone]   RxID:   [id number]    Prevention & Chronic Care Immunizations   Influenza vaccine: Fluvax Non-MCR  (  12/28/2006)   Influenza vaccine deferral: Refused  (01/18/2009)    Tetanus booster: Not documented    Pneumococcal vaccine: Not documented    H. zoster vaccine: Not documented  Colorectal Screening   Hemoccult: Negative  (10/20/2003)    Colonoscopy: Results: Polyp.  Pt with polyp in transverse colon  (10/03/2006)   Colonoscopy action/deferral: Repeat colonoscopy in 5 years.   (10/03/2006)   Colonoscopy due: 10/03/2011  Other Screening   Pap smear: Not documented   Pap smear action/deferral: Not indicated S/P hysterectomy  (09/09/2008)    Mammogram: ASSESSMENT: Benign - BI-RADS 2^MM DIGITAL SCREENING  (05/07/2009)   Mammogram action/deferral: Ordered  (04/14/2009)   Mammogram  due: 09/10/2009    DXA bone density scan: Not documented   DXA bone density action/deferral: Refused  (04/14/2009)   Smoking status: never  (08/02/2009)  Lipids   Total Cholesterol: 239  (04/14/2009)   LDL: 159  (04/14/2009)   LDL Direct: Not documented   HDL: 63  (04/14/2009)   Triglycerides: 84  (04/14/2009)    SGOT (AST): 18  (04/14/2009)   SGPT (ALT): 16  (04/14/2009)   Alkaline phosphatase: 83  (04/14/2009)   Total bilirubin: 0.4  (04/14/2009)  Hypertension   Last Blood Pressure: 134 / 71  (08/02/2009)   Serum creatinine: 0.84  (04/14/2009)   Serum potassium 4.4  (04/14/2009)  Self-Management Support :   Personal Goals (by the next clinic visit) :      Personal blood pressure goal: 140/90  (08/02/2009)     Personal LDL goal: 100  (08/02/2009)    Patient will work on the following items until the next clinic visit to reach self-care goals:     Medications and monitoring: take my medicines every day  (08/02/2009)     Eating: eat baked foods instead of fried foods, eat fruit for snacks and desserts  (08/02/2009)     Activity: take a 30 minute walk every day  (08/02/2009)    Hypertension self-management support: Pre-printed educational material, Resources for patients handout, Written self-care plan  (08/02/2009)   Hypertension self-care plan printed.    Lipid self-management support: Pre-printed educational material, Resources for patients handout  (08/02/2009)        Resource handout printed.  Appended Document: est-ck/fu/meds/cfb

## 2010-03-22 NOTE — Miscellaneous (Signed)
Summary: HIV Testing  HIV Testing   Imported By: Florinda Marker 02/07/2008 14:06:24  _____________________________________________________________________  External Attachment:    Type:   Image     Comment:   External Document

## 2010-03-22 NOTE — Medication Information (Signed)
Summary: SIMVASTATIN   SIMVASTATIN   Imported By: Margie Billet 12/09/2009 12:20:15  _____________________________________________________________________  External Attachment:    Type:   Image     Comment:   External Document

## 2010-03-22 NOTE — Assessment & Plan Note (Signed)
Summary: est-ck/fu/meds/cfb   Vital Signs:  Patient profile:   70 year old female Height:      63 inches (160.02 cm) Weight:      177.01 pounds (80.46 kg) BMI:     31.47 Temp:     97.8 degrees F (36.56 degrees C) oral Pulse rate:   68 / minute BP sitting:   122 / 68  (left arm)  Vitals Entered By: Angelina Ok RN (April 14, 2009 9:09 AM) Is Patient Diabetic? No Pain Assessment Patient in pain? no      Nutritional Status BMI of > 30 = obese  Have you ever been in a relationship where you felt threatened, hurt or afraid?No   Does patient need assistance? Functional Status Self care Ambulation Normal Comments Pt states that Calcium pills make her Nauseaous.  Smokers cause her to have chect pain.  Has not started the Omeprazole because of the color of the pills.  Prefers a white pill.    Wants to know about her Blood and Cholesterol.  Wants a check up.   Primary Care Provider:  Darnelle Maffucci MD   History of Present Illness: Pt is a 70 yo female w/ past med hx below here for routine f/u.  She notes the arthritis in her knees is better w/ tylenol.  She denies falls and memory troubles.  She has not taken the new omeprazole tablets b/c they are green and she is suspicious that they aren't as good as the name brand.  She notes "I feel great."   Depression History:      The patient denies a depressed mood most of the day and a diminished interest in her usual daily activities.         Preventive Screening-Counseling & Management  Alcohol-Tobacco     Alcohol drinks/day: 0     Smoking Status: never  Current Medications (verified): 1)  Norvasc 10 Mg Tabs (Amlodipine Besylate) .... Take 1 Tablet By Mouth Once A Day 2)  Aspirin 81 Mg Tbec (Aspirin) .... Take 1 Tablet By Mouth Once A Day 3)  Multivitamins  Tabs (Multiple Vitamin) .... Take 1 Tablet By Mouth Once A Day 4)  Metamucil 30.9 % Powd (Psyllium) .... Take 1 Teaspoon Mixed in 8 Oz of Water Once Daily 5)  Pravastatin  Sodium 40 Mg Tabs (Pravastatin Sodium) .... Take 2 Tablets By Mouth Once Daily 6)  Omeprazole 20 Mg  Cpdr (Omeprazole) .Marland Kitchen.. 1 Each Day 20-30 Min Prior To Breakfast Meal 7)  Oscal 500/200 D-3 500-200 Mg-Unit Tabs (Calcium-Vitamin D) .... Take 1 Tablet By Mouth Two Times A Day 8)  Tylenol Extra Strength 500 Mg Tabs (Acetaminophen) .... Take 2 Tablets By Mouth Three Times A Day As Needed For Pain 9)  Proventil Hfa 108 (90 Base) Mcg/act Aers (Albuterol Sulfate) .... Take 1-2 Puffs Every 4 Hours As Needed For Shortness of Breath and Wheezing. 10)  Ferrous Sulfate 325 (65 Fe) Mg Tabs (Ferrous Sulfate) .... Take 1 Tablet By Mouth Two Times A Day  Allergies (verified): 1)  Ibuprofen  Past History:  Past Medical History: Last updated: 02/07/2008 Hyperlipidemia Hypertension Panic disorder Unstable angina  ---Cath 2007: LVEF 65%, Right dominant system with patent coronary arteries --- Holter monitor 2007: negative per clinic note Constipation GERD Depression Osteoarthritis  -- R knee meniscal repair 2005 palpitations Hx of dizziness Anemia blurred vision right thumb skin lesion:  ? ganglioma Bilateral cataracts. colonic tubular adenoma, August 2008, repeat colonoscopy August 2013  Past Surgical History:  Last updated: 01/31/2008 Appendectomy  Social History: Last updated: 01/31/2008 does not drink alcohol, does not drink caffeinated beverages.  Risk Factors: Smoking Status: never (04/14/2009)  Social History: Reviewed history from 01/31/2008 and no changes required. does not drink alcohol, does not drink caffeinated beverages.  Review of Systems       As per HPI.  Physical Exam  General:  alert, pleasant, well groomed, no distress.  Eyes:  anicteric. Mouth:  MMM, dentures in place. Lungs:  Normal respiratory effort, chest expands symmetrically. Lungs are clear to auscultation, no crackles or wheezes. Heart:  Normal rate and regular rhythm. Abdomen:  +BS's, soft, NT and  ND. Extremities:  no edema.  Neurologic:  no requiring assistive devices for ambulation. Psych:  mood euthymic.   Impression & Recommendations:  Problem # 1:  HYPERLIPIDEMIA (ICD-272.4) Lipids due for recheck today as they were elevated at her last visit.  Her updated medication list for this problem includes:    Pravastatin Sodium 40 Mg Tabs (Pravastatin sodium) .Marland Kitchen... Take 2 tablets by mouth once daily  Orders: T-Lipid Profile [id number]) T-Comprehensive Metabolic Panel [id number])  Labs Reviewed: SGOT: 21 (07/29/2008)   SGPT: 17 (07/29/2008)   HDL:67 (11/23/2008), 58 (09/11/2007)  LDL:164 (11/23/2008), 146 (09/11/2007)  Chol:247 (11/23/2008), 219 (09/11/2007)  Trig:79 (11/23/2008), 73 (09/11/2007)  Problem # 2:  HYPERTENSION (ICD-401.9) BP at goal, no changes.  Her updated medication list for this problem includes:    Norvasc 10 Mg Tabs (Amlodipine besylate) .Marland Kitchen... Take 1 tablet by mouth once a day  BP today: 122/68 Prior BP: 136/62 (01/18/2009)  Labs Reviewed: K+: 4.2 (07/29/2008) Creat: : 0.93 (07/29/2008)   Chol: 247 (11/23/2008)   HDL: 67 (11/23/2008)   LDL: 164 (11/23/2008)   TG: 79 (11/23/2008)  Problem # 3:  Preventive Health Care (ICD-V70.0) She is amenable to doing mammogram today.  Order placed.  Would like to hold off on bone density, etc for now.  Problem # 4:  WHEEZING (ICD-786.07) Still working in [employer], but not requiring albuterol and has very few symptoms.  Problem # 5:  VITAMIN D DEFICIENCY (ICD-268.9) May need f/u Vit D level at some point to make sure she was adequately repleted.   Complete Medication List: 1)  Norvasc 10 Mg Tabs (Amlodipine besylate) .... Take 1 tablet by mouth once a day 2)  Aspirin 81 Mg Tbec (Aspirin) .... Take 1 tablet by mouth once a day 3)  Multivitamins Tabs (Multiple vitamin) .... Take 1 tablet by mouth once a day 4)  Metamucil 30.9 % Powd (Psyllium) .... Take 1 teaspoon mixed in 8 oz of water once daily 5)   Pravastatin Sodium 40 Mg Tabs (Pravastatin sodium) .... Take 2 tablets by mouth once daily 6)  Omeprazole 20 Mg Cpdr (Omeprazole) .Marland Kitchen.. 1 each day 20-30 min prior to breakfast meal 7)  Oscal 500/200 D-3 500-200 Mg-unit Tabs (Calcium-vitamin d) .... Take 1 tablet by mouth two times a day 8)  Tylenol Extra Strength 500 Mg Tabs (Acetaminophen) .... Take 2 tablets by mouth three times a day as needed for pain 9)  Proventil Hfa 108 (90 Base) Mcg/act Aers (Albuterol sulfate) .... Take 1-2 puffs every 4 hours as needed for shortness of breath and wheezing. 10)  Ferrous Sulfate 325 (65 Fe) Mg Tabs (Ferrous sulfate) .... Take 1 tablet by mouth two times a day  Other Orders: Mammogram (Mammogram)  Patient Instructions: 1)  You are doing great!  Keep up the good work! 2)  Please make a followup  in 6 months for a checkup. 3)  You will be called with any abnormal labwork.  Please make sure your phone number is correct at the front desk. Prescriptions: NORVASC 10 MG TABS (AMLODIPINE BESYLATE) Take 1 tablet by mouth once a day  #90 x 3   Entered and Authorized by:   Joaquin Courts  MD   Signed by:   Joaquin Courts  MD on 04/14/2009   Method used:   Electronically to        [id number]* (retail)       [address] [place], [place]  [address]       Ph: [phone]       Fax: [phone]   RxID:   [id number] NORVASC 10 MG TABS (AMLODIPINE BESYLATE) Take 1 tablet by mouth once a day  #31 x 11   Entered and Authorized by:   Joaquin Courts  MD   Signed by:   Joaquin Courts  MD on 04/14/2009   Method used:   Electronically to        [facility] #[id number]* (retail)       [address] [place], [place]  [address]       Ph: [phone]       Fax: [phone]   RxID:   [id number]   Prevention & Chronic Care Immunizations   Influenza vaccine: Fluvax Non-MCR  (12/28/2006)   Influenza vaccine deferral: Refused  (01/18/2009)    Tetanus booster: Not  documented    Pneumococcal vaccine: Not documented    H. zoster vaccine: Not documented  Colorectal Screening   Hemoccult: Negative  (10/20/2003)    Colonoscopy: Results: Polyp.  Pt with polyp in transverse colon  (10/03/2006)   Colonoscopy action/deferral: Repeat colonoscopy in 5 years.   (10/03/2006)   Colonoscopy due: 10/03/2011  Other Screening   Pap smear: Not documented   Pap smear action/deferral: Not indicated S/P hysterectomy  (09/09/2008)    Mammogram: No specific mammographic evidence of malignancy.  Assessment: BIRADS 1.   (09/11/2007)   Mammogram action/deferral: Ordered  (04/14/2009)   Mammogram due: 09/10/2009    DXA bone density scan: Not documented   DXA bone density action/deferral: Refused  (04/14/2009)   Smoking status: never  (04/14/2009)  Lipids   Total Cholesterol: 247  (11/23/2008)   LDL: 164  (11/23/2008)   LDL Direct: Not documented   HDL: 67  (11/23/2008)   Triglycerides: 79  (11/23/2008)    SGOT (AST): 21  (07/29/2008)   SGPT (ALT): 17  (07/29/2008) CMP ordered    Alkaline phosphatase: 97  (07/29/2008)   Total bilirubin: 0.4  (07/29/2008)    Lipid flowsheet reviewed?: Yes   Progress toward LDL goal: Unchanged  Hypertension   Last Blood Pressure: 122 / 68  (04/14/2009)   Serum creatinine: 0.93  (07/29/2008)   Serum potassium 4.2  (07/29/2008) CMP ordered     Hypertension flowsheet reviewed?: Yes   Progress toward BP goal: At goal  Self-Management Support :    Patient will work on the following items until the next clinic visit to reach self-care goals:     Medications and monitoring: take my medicines every day  (04/14/2009)     Eating: drink diet soda or water instead of juice or soda, eat more vegetables, eat foods that are low in salt  (04/14/2009)     Activity: take a 30 minute walk every day  (  04/14/2009)    Hypertension self-management support: Education handout, Written self-care plan  (04/14/2009)   Hypertension  self-care plan printed.   Hypertension education handout printed    Lipid self-management support: Education handout  (04/14/2009)     Lipid education handout printed  Process Orders Check Orders Results:     [facility]: ABN not required for this insurance Tests Sent for requisitioning (April 14, 2009 11:36 AM):     04/14/2009: [facility] -- T-Lipid Profile [id number] (signed)     04/14/2009: [facility] -- T-Comprehensive Metabolic Panel [id number] (signed)     Vital Signs:  Patient profile:   70 year old female Height:      63 inches (160.02 cm) Weight:      177.01 pounds (80.46 kg) BMI:     31.47 Temp:     97.8 degrees F (36.56 degrees C) oral Pulse rate:   68 / minute BP sitting:   122 / 68  (left arm)  Vitals Entered By: Angelina Ok RN (April 14, 2009 9:09 AM)

## 2010-03-22 NOTE — Assessment & Plan Note (Signed)
Summary: FU/EST/VS   Vital Signs:  Patient Profile:   70 Years Old Female Height:     63 inches (160.02 cm) Weight:      175.02 pounds (79.55 kg) BMI:     31.12 O2 Sat:      97 % O2 treatment:    Room Air Temp:     99.3 degrees F (37.39 degrees C) oral Pulse rate:   80 / minute BP sitting:   145 / 69  (left arm)  Pt. in pain?   yes    Location:   head    Intensity:   4    Type:       aching  Vitals Entered By: Angelina Ok RN (February 07, 2008 10:12 AM)              Is Patient Diabetic? No Nutritional Status BMI of > 30 = obese  Have you ever been in a relationship where you felt threatened, hurt or afraid?Unable to ask   Does patient need assistance? Functional Status Self care Ambulation Normal     PCP:  Rufina Falco, MD  Chief Complaint:  Feeling bad.  History of Present Illness: Pt is a 70 year old woman with PMH significant for HTN, anemia, arthritis, GERD, and HLD who presents to clinic to have some lab work done for anemia and leukopenia.  Pt reports that she is having generalized fatigue and weakness and is feeling bad.  She reports subjective fevers and chills but is afebrile here.  She reports sick contact with Pneumonia.  Pt denies focal weakness, chest pain, cough, SOB, diarrhea, melena, hematochezia, dysuria, discharge, skin rash or cuts, rhinorrhea, ear pain, sore throat, etc... Pt has no other focused complaints to give source of her feeling bad.   Pt was seen by Dr. Christella Hartigan for abdominal pain and was found to have a polyp on colonoscopy and on labs was noted to have mild anemia with HB of 11.9 and leukopenia, hence the appt for further workup.      Prior Medications Reviewed Using: Patient Recall  Prior Medication List:  NORVASC 10 MG TABS (AMLODIPINE BESYLATE) Take 1 tablet by mouth once a day ASPIRIN 81 MG TBEC (ASPIRIN) Take 1 tablet by mouth once a day PROTONIX 40 MG  TBEC (PANTOPRAZOLE SODIUM) Take 1 tablet by mouth once a  day MULTIVITAMINS  TABS (MULTIPLE VITAMIN) Take 1 tablet by mouth once a day DULCOLAX 5 MG TBEC (BISACODYL) Take 1 tablet by mouth two times a day as needed for constipation COLACE 100 MG CAPS (DOCUSATE SODIUM) Take 1 capsule by mouth two times a day METAMUCIL 30.9 % POWD (PSYLLIUM) Take 1 teaspoon mixed in 8 oz of water once daily PRAVASTATIN SODIUM 40 MG TABS (PRAVASTATIN SODIUM) take 2 tablets by mouth once daily   Current Allergies (reviewed today): No known allergies   Past Medical History:    Hyperlipidemia    Hypertension    Panic disorder    Unstable angina     ---Cath 2007: LVEF 65%, Right dominant system with patent coronary arteries    --- Holter monitor 2007: negative per clinic note    Constipation    GERD    Depression    Osteoarthritis     -- R knee meniscal repair 2005    palpitations    Hx of dizziness    Anemia    blurred vision    right thumb skin lesion:  ? ganglioma    Bilateral cataracts.  colonic tubular adenoma, August 2008, repeat colonoscopy August 2013    Risk Factors: Tobacco use:  never Alcohol use:  no Exercise:  yes    Times per week:  3    Type:  bike - sometime; weighs Seatbelt use:  100 %  Colonoscopy History:    Date of Last Colonoscopy:  10/03/2006  Mammogram History:    Date of Last Mammogram:  09/11/2007   Review of Systems       The patient complains of fever and headaches.  The patient denies anorexia, weight loss, weight gain, chest pain, dyspnea on exertion, prolonged cough, hemoptysis, abdominal pain, melena, hematochezia, hematuria, incontinence, genital sores, suspicious skin lesions, and abnormal bleeding.     Physical Exam  General:     alert, well-developed, well-nourished, well-hydrated, appropriate dress, cooperative to examination, and overweight-appearing.   Head:     normocephalic and atraumatic.   Eyes:     pupils equal, pupils round, pupils reactive to light, and no injection.   Nose:     no  external erythema and no nasal discharge.   Mouth:     pharynx pink and moist, no erythema, no exudates, and no lesions.   Neck:     supple, no JVD, no cervical lymphadenopathy, and no neck tenderness.   Lungs:     normal respiratory effort, no intercostal retractions, no accessory muscle use, normal breath sounds, no dullness, no crackles, and no wheezes.   Heart:     normal rate, regular rhythm, no gallop, no rub, and no JVD.   Abdomen:     soft, non-tender, normal bowel sounds, no distention, and no guarding.   Msk:     no joint swelling.   Extremities:     No clubbing, cyanosis, edema, or deformity noted with normal full range of motion of all joints.   Neurologic:     alert & oriented X3, cranial nerves II-XII intact, strength normal in all extremities, and sensation intact to light touch.   Skin:     turgor normal, color normal, no rashes, and no edema.   Cervical Nodes:     no anterior cervical adenopathy and no posterior cervical adenopathy.   Psych:     Oriented X3, normally interactive, good eye contact, not anxious appearing, and not depressed appearing.      Impression & Recommendations:  Problem # 1:  WEAKNESS (ICD-780.79) Etiology of today's complaints is unknown at this time.  Pt has no focal findings or symptoms to give a hint of her symptoms.  Pt does have anemia but her hemoglobin a couple of days ago was 11.9.  She could be getting a viral illness but not sure of location secondary to lack of focal findings.  Neuro exam is completely non focal.  Will check anemia panel, HIV test, TSH, peripheral smear (leukopenia), and CBC with differential.  I have advised Mrs. Queenan to take it easy for the next few days and to continue clear liquids.  She is also to use Tylenol as needed fever and pain.  I have told her she can use advil for her knee pains.  Will also check blood cultures given her chills, although pt is afebrile here.  Will follow up labs and treat accordingly.   Pt advised to call clinic if she develops new symptoms or to go to the ER.   Orders: T-Culture, Blood Routine ([id number])   Problem # 2:  OTHER DECREASED WHITE BLOOD CELL COUNT (ICD-288.59) Will check HIV test  and peripheral smear to start with.  Will also do anemia workup.  Will repeat CBC with differential as well.   Orders: T- * Misc. Laboratory test [id number])   Problem # 3:  ANEMIA (ICD-285.9) Will procede with anemia workup as mentioned in #1.  Pt has already had colonoscopy which showed polyp.  Pathology report pending.  Pt may be getting EGD with GI.  Will follow up with labs.  Orders: T-Culture, Blood Routine ([id number]) T- * Misc. Laboratory test [phone])   Problem # 4:  OSTEOARTHRITIS (ICD-715.90) Pt to use advil as needed.  Will make sure creatinine is ok.  Her updated medication list for this problem includes:    Aspirin 81 Mg Tbec (Aspirin) .Marland Kitchen... Take 1 tablet by mouth once a day  Orders: T-Culture, Blood Routine ([id number])   Problem # 5:  GERD (ICD-530.81) Continue PPI.  Her updated medication list for this problem includes:    Protonix 40 Mg Tbec (Pantoprazole sodium) .Marland Kitchen... Take 1 tablet by mouth once a day   Complete Medication List: 1)  Norvasc 10 Mg Tabs (Amlodipine besylate) .... Take 1 tablet by mouth once a day 2)  Aspirin 81 Mg Tbec (Aspirin) .... Take 1 tablet by mouth once a day 3)  Protonix 40 Mg Tbec (Pantoprazole sodium) .... Take 1 tablet by mouth once a day 4)  Multivitamins Tabs (Multiple vitamin) .... Take 1 tablet by mouth once a day 5)  Dulcolax 5 Mg Tbec (Bisacodyl) .... Take 1 tablet by mouth two times a day as needed for constipation 6)  Colace 100 Mg Caps (Docusate sodium) .... Take 1 capsule by mouth two times a day 7)  Metamucil 30.9 % Powd (Psyllium) .... Take 1 teaspoon mixed in 8 oz of water once daily 8)  Pravastatin Sodium 40 Mg Tabs (Pravastatin sodium) .... Take 2 tablets by mouth once daily  Other Orders: T-Culture, Blood  Routine ([id number])   Patient Instructions: 1)  Please schedule a follow-up appointment in 2 weeks.  If you are feeling better then you can cancel the appointment 2)  I will call you with any abnormal labs.   3)  We are going to have your test results sent to Dr. Christella Hartigan for you 4)  I want you to take it easy for the next few days. 5)  Drink clear liquids only for the next 24 hours, then slowly add other liquids and food as you  tolerate them. 6)  Take 650-1000mg  of Tylenol every 4-6 hours as needed for relief of pain or comfort of fever AVOID taking more than 4000mg   in a 24 hour period (can cause liver damage in higher doses). 7)  If you start having increased fevers, nausea, vomiting, cough, diarrhea, or other symptoms please call the clinic or go to the emergency room.  If you start to have increased weakness or weakness that is more pronounced in one area of your body go to the emergency room immediately.     ]  Vital Signs:  Patient Profile:   70 Years Old Female Height:     63 inches (160.02 cm) Weight:      175.02 pounds (79.55 kg) BMI:     31.12 O2 Sat:      97 % Temp:     99.3 degrees F (37.39 degrees C) oral Pulse rate:   80 / minute BP sitting:   145 / 69    Location:   head    Intensity:   4  Type:       aching

## 2010-03-22 NOTE — Progress Notes (Signed)
Summary: phone/gg  Phone Note Call from Patient   Caller: Patient Summary of Call: Pt called stating since yesterday having pain on left side of chest. yesterday BP 137/47  today BP 160/90.  She had had this for years and has been told this was muscle spasm.  Today it feels different, being mostly over the left breast.  no radiation, rates pain as dull feeling. Denies nausea, SOB, dizziness. Pt # [id number] Initial call taken by: Merrie Roof RN,  January 11, 2010 9:15 AM  Follow-up for Phone Call        There is a long hx of Chest pain. If pain is worse than usual she should go to ED for evaluation- will need EKG and at least one set of labs. This is my first rec- otherwise if she refuses we can see her this afternoon. Follow-up by: Julaine Fusi  DO,  January 11, 2010 10:34 AM  Additional Follow-up for Phone Call Additional follow up Details #1::        Pt states pain is NOT worse that normal.  She will go to ED if any changes.  She does not want to go to ED. Will see at 1400 Additional Follow-up by: Merrie Roof RN,  January 11, 2010 10:44 AM

## 2010-03-22 NOTE — Miscellaneous (Signed)
Summary: rx  Clinical Lists Changes  Medications: Removed medication of PROTONIX 40 MG  TBEC (PANTOPRAZOLE SODIUM) Take 1 tablet by mouth once a day Added new medication of OMEPRAZOLE 20 MG  CPDR (OMEPRAZOLE) 1 each day 20-30 min prior to breakfast meal - Signed Rx of OMEPRAZOLE 20 MG  CPDR (OMEPRAZOLE) 1 each day 20-30 min prior to breakfast meal;  #30 x 11;  Signed;  Entered by: Rachael Fee MD;  Authorized by: Rachael Fee MD;  Method used: Electronically to [id number] [address], [place], [place]  [address], Ph: [phone], Fax: [phone]    Prescriptions: OMEPRAZOLE 20 MG  CPDR (OMEPRAZOLE) 1 each day 20-30 min prior to breakfast meal  #30 x 11   Entered and Authorized by:   Rachael Fee MD   Signed by:   Rachael Fee MD on 04/01/2008   Method used:   Electronically to        [facility] [id number]* (retail)       [address] [place], [place]  [address]       Ph: [phone]       Fax: [phone]   RxID:   [id number]

## 2010-03-22 NOTE — Assessment & Plan Note (Signed)
Summary: R leg "goes numb", epigastric discomfort/pcp-young/hla   Vital Signs:  Patient Profile:   70 Years Old Female Height:     63 inches (160.02 cm) Weight:      175.8 pounds (79.91 kg) BMI:     31.25 Temp:     97.9 degrees F (36.61 degrees C) oral Pulse rate:   90 / minute BP sitting:   134 / 75  (right arm)  Pt. in pain?   no  Vitals Entered By: Brianna Kidney Ditzler RN (December 27, 2007 9:43 AM)              Is Patient Diabetic? No Nutritional Status BMI of > 30 = obese Nutritional Status Detail appetite good  Have you ever been in a relationship where you felt threatened, hurt or afraid?denies   Does patient need assistance? Functional Status Self care Ambulation Normal     Chief Complaint:  Informed to stop Lisinopril and Pravastatin till appt. Acid reflux getting worse. Past 2 years occ h/a and walking and talking in sleep.Marland Kitchen  History of Present Illness: Brianna Pittman is a 70yo woman with PMH as outlined below who was seen by Dr. Maple Hudson on 12/09/07, at which time she had myalgias and was found to have an elevated CK. There was some question about dosing of her pravastatin which she was asked to discontinue. She returns today for followup.  She stopped taking pravachol as requested. She apparently has stopped taking lisinopril because she thought norvasc was to replace it.   C/o feeling like she will pass out sometimes after eating. Vision gets blurry, feels weak and "wobbly", Sometimes assoc with Dull pain -- epigastric + L flank and in throat.  No diaphoresis, but does feel hot. Denies dyspnea with these episodes. She has to go lie down and it eventually passes. Most days has an epiode.  She does have a history of panic attacks in the past but says theses are not really the same.  She sometimes has these episodes when she is in the store and she has to sit down right away to avoid passing out. I cannot pin her down on the last time she had an episode unrelated to eating.    Prior Medications Reviewed Using: Medication Bottles  Updated Prior Medication List: NORVASC 10 MG TABS (AMLODIPINE BESYLATE) Take 1 tablet by mouth once a day (not taking, because thought norvasc replaced this) LISINOPRIL 40 MG TABS (LISINOPRIL) Take 1 tablet by mouth once a day ASPIRIN 81 MG TBEC (ASPIRIN) Take 1 tablet by mouth once a day (not taking per Dr. Roxy Cedar request) PRAVACHOL 40 MG TABS (PRAVASTATIN SODIUM) 2 tabs by mouth Qday PROTONIX 40 MG  TBEC (PANTOPRAZOLE SODIUM) Take 1 tablet by mouth once a day FLEXERIL 5 MG  TABS (CYCLOBENZAPRINE HCL) take 1 pill by mouth three times a day for 3 wks. PRILOSEC OTC 20 MG TBEC (OMEPRAZOLE MAGNESIUM) one by mouth daily at bedtime MULTIVITAMINS  TABS (MULTIPLE VITAMIN) Take 1 tablet by mouth once a day DULCOLAX 5 MG TBEC (BISACODYL) Take 1 tablet by mouth two times a day as needed for constipation COLACE 100 MG CAPS (DOCUSATE SODIUM) Take 1 capsule by mouth two times a day METAMUCIL 30.9 % POWD (PSYLLIUM) Take 1 teaspoon mixed in 8 oz of water once daily  Current Allergies (reviewed today): No known allergies   Past Medical History:    Reviewed history from 09/03/2007 and no changes required:       Hyperlipidemia  Hypertension       Panic disorder       Unstable angina        ---Cath 2007: LVEF 65%, Right dominant system with patent coronary arteries       --- Holter monitor 2007: negative per clinic note       Constipation       GERD       Depression       Osteoarthritis        -- R knee meniscal repair 2005       palpitations       Hx of dizziness       blurred vision       right thumb skin lesion:  ? ganglioma       Bilateral cataracts.    Risk Factors: Tobacco use:  never Alcohol use:  no Exercise:  yes    Times per week:  3    Type:  bike - sometime; weighs Seatbelt use:  100 %  Mammogram History:    Date of Last Mammogram:  09/11/2007   Review of Systems  General      Denies chills, fever, and  weight loss.  Eyes      Denies blurring and double vision.  ENT      Complains of difficulty swallowing.      Denies decreased hearing.      swallowing: rice or pasta can cause trouble swallowing -- needs to drink something to help it go down. Denies waterbrash, but does have sour taste in mouth.  CV      Denies chest pain or discomfort and palpitations.  Resp      Denies shortness of breath.  GI      Denies bloody stools, constipation, dark tarry stools, nausea, vomiting, and vomiting blood.  GU      Denies dysuria.  MS      Complains of joint pain.      Denies muscle aches.      arthritis in knees  Neuro      Complains of numbness.      Denies weakness.      before stopping pravachol, had some numbness in R foot.   Physical Exam  General:     alert and overweight-appearing.   Head:     normocephalic and atraumatic.   Eyes:     vision grossly intact, pupils equal, pupils round, and pupils reactive to light.   Ears:     no external deformities.   Nose:     no external deformity.   Mouth:     pharynx pink and moist.   Lungs:     normal respiratory effort, no crackles, and no wheezes.   Heart:     normal rate, regular rhythm, no murmur, and no gallop.   Abdomen:     soft, no masses, no guarding, and no rigidity.  Tenderness to palpation in epigastric region, which reproduces the pain she feels after eating. Neurologic:     alert & oriented X3, cranial nerves II-XII intact, strength normal in all extremities, and gait normal.      Impression & Recommendations:  Problem # 1:  GERD (ICD-530.81) The differential for her pain includes GERD, PUD, or other GI pathology, vs cardiac causes. After reviewing records in the clinic EMR and e-chart, it seems she had an extensive negative cardiac workup in 2007 including a negative cath and a negative holter monitor. Meanwhile she has epigastric tenderness that reproduces her  symptoms, and she most often has symptoms after  eating.  I think she may have vaso-vagal response to her abdominal pain as the explanation for her dizziness. I asked Brianna Kidney to contact her GI doctor, and we did obtain records of a colonoscopy, but it seems she has never had an upper endoscopy (we also checked in the hospital echart). At first she said she did have an EGD, but when I really described the procedure and asked in detail, she wasn't sure she ever had anything like that. Therefore, will refer for GI eval and will continue her PPI BID regimen (insurance won't pay for protonix BID hence the evening prilosec OTC)  Her updated medication list for this problem includes:    Protonix 40 Mg Tbec (Pantoprazole sodium) .Marland Kitchen... Take 1 tablet by mouth once a day    Prilosec Otc 20 Mg Tbec (Omeprazole magnesium) ..... One by mouth daily at bedtime  Orders: Gastroenterology Referral (GI)   Problem # 2:  MUSCLE PAIN (ICD-729.1) Assessment: Unchanged The pain has resolved. Will D/c flexeril. Will check repeat CK. Will continue to hold pravastatin   The following medications were removed from the medication list:    Flexeril 5 Mg Tabs (Cyclobenzaprine hcl) .Marland Kitchen... Take 1 pill by mouth three times a day for 3 wks.  Her updated medication list for this problem includes:    Aspirin 81 Mg Tbec (Aspirin) .Marland Kitchen... Take 1 tablet by mouth once a day  Orders: T-CK Total ([id number])   Problem # 3:  HYPERTENSION (ICD-401.9) BP well controlled today on norvasc alone. Will hold lisinopril for now in case her symptoms are pre-syncopal related to vasovagal reaction from GERD-related pain.  The following medications were removed from the medication list:    Lisinopril 40 Mg Tabs (Lisinopril) .Marland Kitchen... Take 1 tablet by mouth once a day  Her updated medication list for this problem includes:    Norvasc 10 Mg Tabs (Amlodipine besylate) .Marland Kitchen... Take 1 tablet by mouth once a day   Problem # 4:  DIZZINESS (ICD-780.4) I think this might be vaso-vagal reaction to her  abdominal pain. For now, will focus on GI workup, and reevaluate.  Problem # 5:  HYPERLIPIDEMIA (ICD-272.4) Will continue to hold statin given recent elevated CK. (see above). Assuming CK normalizes, we can recheck an FLP and decide if restarting statin is appropriate.  The following medications were removed from the medication list:    Pravachol 40 Mg Tabs (Pravastatin sodium) .Marland Kitchen... 2 tabs by mouth qday   Problem # 6:  CONSTIPATION (ICD-564.00) I encouraged her to take as needed meds for constipation  Her updated medication list for this problem includes:    Dulcolax 5 Mg Tbec (Bisacodyl) .Marland Kitchen... Take 1 tablet by mouth two times a day as needed for constipation    Colace 100 Mg Caps (Docusate sodium) .Marland Kitchen... Take 1 capsule by mouth two times a day    Metamucil 30.9 % Powd (Psyllium) .Marland Kitchen... Take 1 teaspoon mixed in 8 oz of water once daily   Complete Medication List: 1)  Norvasc 10 Mg Tabs (Amlodipine besylate) .... Take 1 tablet by mouth once a day 2)  Aspirin 81 Mg Tbec (Aspirin) .... Take 1 tablet by mouth once a day 3)  Protonix 40 Mg Tbec (Pantoprazole sodium) .... Take 1 tablet by mouth once a day 4)  Prilosec Otc 20 Mg Tbec (Omeprazole magnesium) .... One by mouth daily at bedtime 5)  Multivitamins Tabs (Multiple vitamin) .... Take 1 tablet by mouth once  a day 6)  Dulcolax 5 Mg Tbec (Bisacodyl) .... Take 1 tablet by mouth two times a day as needed for constipation 7)  Colace 100 Mg Caps (Docusate sodium) .... Take 1 capsule by mouth two times a day 8)  Metamucil 30.9 % Powd (Psyllium) .... Take 1 teaspoon mixed in 8 oz of water once daily   Patient Instructions: 1)  Please schedule a follow-up appointment after 01/22/2008. 2)  Please call if you are feeling worse.   ]

## 2010-03-22 NOTE — Miscellaneous (Signed)
Summary: GI PV  Clinical Lists Changes  Observations: Added new observation of NKA: T (03/03/2008 8:59)

## 2010-03-22 NOTE — Assessment & Plan Note (Signed)
Summary: chest pain, shortofbreath x 2-3 months/pcp-tobbia/hla   Vital Signs:  Patient profile:   70 year old female Height:      63 inches (160.02 cm) Weight:      178.5 pounds (81.14 kg) BMI:     31.73 O2 Sat:      99 % on Room air Temp:     97.4 degrees F (36.33 degrees C) oral Pulse rate:   68 / minute BP sitting:   136 / 62  (left arm)  Vitals Entered By: Stanton Kidney Ditzler RN (January 18, 2009 9:10 AM)  O2 Flow:  Room air Is Patient Diabetic? No Pain Assessment Patient in pain? yes     Location: back Intensity: 8 Onset of pain  recently Nutritional Status BMI of > 30 = obese Nutritional Status Detail appetite good  Have you ever been in a relationship where you felt threatened, hurt or afraid?denies   Does patient need assistance? Functional Status Self care Ambulation Normal Comments Sitting with a smoker in PM - starting to wheeze and back feels cold. Itching all over since lastvs - pt thinks allergic Calcium.   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  She notes her SOB and wheezing only occurs when she is around smokers or dogs and occurs at rest and w/ exertion.  She has occasional chest pain associated with the wheezing.  She denies cough, fevers/chills, rashes, and sore throat.  She would like a note for work stating she can not work around smokers and dogs.  Her only other complaint is back pain in the lower thoracic area that is intermittent in nature.  She noticed it for the first time a couple of months ago.  She notes the pain is worse with lifting/bending.  She has taken tylenol with some relief.  She had similar pain before when she worked at a [employer].  She denies parasthesias, bowel/bladder issue, and numbness.    Of  note, she took ibu several times for pain but it caused itching so she stopped taking it. Also, she does not want a mammogram to avoid radiation exposure b/c she had a recent CXR and she declines the flu shot.   Depression History:      The patient denies a depressed mood most of the day and a diminished interest in her usual daily activities.         Preventive Screening-Counseling & Management  Alcohol-Tobacco     Alcohol drinks/day: 0     Smoking Status: never  Caffeine-Diet-Exercise     Does Patient Exercise: yes     Type of exercise: bike - sometime; weighs     Times/week: 1-2  Current Medications (verified): 1)  Norvasc 10 Mg Tabs (Amlodipine Besylate) .... Take 1 Tablet By Mouth Once A Day 2)  Aspirin 81 Mg Tbec (Aspirin) .... Take 1 Tablet By Mouth Once A Day 3)  Multivitamins  Tabs (Multiple Vitamin) .... Take 1 Tablet By Mouth Once A Day 4)  Metamucil 30.9 % Powd (Psyllium) .... Take 1 Teaspoon Mixed in 8 Oz of Water Once Daily 5)  Pravastatin Sodium 40 Mg Tabs (Pravastatin Sodium) .... Take 2 Tablets By Mouth Once Daily 6)  Omeprazole 20 Mg  Cpdr (Omeprazole) .Marland KitchenMarland KitchenMarland Kitchen 1  Each Day 20-30 Min Prior To Breakfast Meal 7)  Amitriptyline Hcl 25 Mg Tabs (Amitriptyline Hcl) .... Take One Pill Each Night 8)  Oscal 500/200 D-3 500-200 Mg-Unit Tabs (Calcium-Vitamin D) .... Take 1 Tablet By Mouth Two Times A Day 9)  Tylenol Extra Strength 500 Mg Tabs (Acetaminophen) .... Take 1 Tablet By Mouth Four Times A Day As Needed For Pain 10)  Ibuprofen 400 Mg Tabs (Ibuprofen) .... Take 1 Tablet By Mouth Three Times A Day  Allergies: 1)  Ibuprofen  Past History:  Past Medical History: Last updated: 02/07/2008 Hyperlipidemia Hypertension Panic disorder Unstable angina  ---Cath 2007: LVEF 65%, Right dominant system with patent coronary arteries --- Holter monitor 2007: negative per clinic note Constipation GERD Depression Osteoarthritis  -- R knee meniscal repair 2005 palpitations Hx of  dizziness Anemia blurred vision right thumb skin lesion:  ? ganglioma Bilateral cataracts. colonic tubular adenoma, August 2008, repeat colonoscopy August 2013  Past Surgical History: Last updated: 01/31/2008 Appendectomy  Social History: Last updated: 01/31/2008 does not drink alcohol, does not drink caffeinated beverages.  Risk Factors: Smoking Status: never (01/18/2009)  Social History: Reviewed history from 01/31/2008 and no changes required. does not drink alcohol, does not drink caffeinated beverages.  Review of Systems       As per HPI.  Physical Exam  General:  Well-developed,well-nourished,in no acute distress; alert,appropriate and cooperative throughout examination Eyes:  arcus senilus bilaterally, PERRL Mouth:  MMM, dentures in place on top. Lungs:  Normal respiratory effort, chest expands symmetrically. Lungs are clear to auscultation, no wheezeing, slightly decreased air movement on L upper lung field. Heart:  Normal rate and regular rhythm. S1 and S2 normal without gallop, murmur, click, rub or other extra sounds. Abdomen:  +BS's, soft, NT. Msk:  strength intact w/ flxn, extn, ab/adduction in all extremities.  Paraspinal muscles on R, particularly between scapula and t spine tender and tight on exam. Extremities:  no edema. Neurologic:  CN's II-XII intact, reflexes decreased but symmetric w/ patellar, biceps and achilles, sensation intact in bilateral upper and lowe extremities. Skin:  No rashes over t spine. Cervical Nodes:  no LAD. Psych:  mood euthymic.   Impression & Recommendations:  Problem # 1:  WHEEZING (ICD-786.07) Notes this is a chronic problem when she is around dogs/smokers dating back 20 years.  Not wheezing today on exam.  No worrisome features to suggest other etiology, such as cardiac asthma, etc, plus had neg cath in 07.  For now, will give a prescription for albuterol inhaler to use for emergencies and provide her w/ work excuse to avoid  these environmental triggers of smokers and dogs.  Will call sooner if symptoms persist despite removing environmental allergens.  Of note, CXR 4 months ago negative and no evidence on exam to suggest heart failure contributing.  May need PFT's or other workup if symptoms persist.  Problem # 2:  BACK PAIN, ACUTE (ICD-724.5) This problem also dates back for years and is intermittent in nature.  No worrisome findings on hx or exam to suggest rhabdo(given statin use, malignancy, compression frx, etc) so will hold off on imaging.  Will increase dose of tylenol and provide a small prescription for ultram and advised not to drive after taking it.   Her updated medication list for this problem includes:    Aspirin 81 Mg Tbec (Aspirin) .Marland Kitchen... Take 1 tablet by mouth once a day    Tylenol Extra Strength 500 Mg Tabs (Acetaminophen) .Marland Kitchen... Take 2 tablets by mouth  three times a day as needed for pain    Ultram 50 Mg Tabs (Tramadol hcl) .Marland Kitchen... Take one tablet by mouth before bedtime for pain.  Problem # 3:  Preventive Health Care (ICD-V70.0) Pt declines mammogram and flu shot today as she is concerned about excessive radiation exposure and notes she got the flu last time she took the shot.  Up to date on colonoscopy.  Problem # 4:  HYPERLIPIDEMIA (ICD-272.4) Pt has increased her statin dose, will f/u lipids/LFT's at next visit.  Her updated medication list for this problem includes:    Pravastatin Sodium 40 Mg Tabs (Pravastatin sodium) .Marland Kitchen... Take 2 tablets by mouth once daily  Complete Medication List: 1)  Norvasc 10 Mg Tabs (Amlodipine besylate) .... Take 1 tablet by mouth once a day 2)  Aspirin 81 Mg Tbec (Aspirin) .... Take 1 tablet by mouth once a day 3)  Multivitamins Tabs (Multiple vitamin) .... Take 1 tablet by mouth once a day 4)  Metamucil 30.9 % Powd (Psyllium) .... Take 1 teaspoon mixed in 8 oz of water once daily 5)  Pravastatin Sodium 40 Mg Tabs (Pravastatin sodium) .... Take 2 tablets by mouth  once daily 6)  Omeprazole 20 Mg Cpdr (Omeprazole) .Marland Kitchen.. 1 each day 20-30 min prior to breakfast meal 7)  Amitriptyline Hcl 25 Mg Tabs (Amitriptyline hcl) .... Take one pill each night 8)  Oscal 500/200 D-3 500-200 Mg-unit Tabs (Calcium-vitamin d) .... Take 1 tablet by mouth two times a day 9)  Tylenol Extra Strength 500 Mg Tabs (Acetaminophen) .... Take 2 tablets by mouth three times a day as needed for pain 10)  Proventil Hfa 108 (90 Base) Mcg/act Aers (Albuterol sulfate) .... Take 1-2 puffs every 4 hours as needed for shortness of breath and wheezing. 11)  Ultram 50 Mg Tabs (Tramadol hcl) .... Take one tablet by mouth before bedtime for pain.  Patient Instructions: 1)  Please make a followup appointment in 2 months for a checkup. 2)  Call sooner if you do not feel better. 3)  Please do not drive after taking your new pain pill, called ultram. 4)  Only use your albuterol inhaler if you feel really short of breath or are wheezing. 5)  Please increase your tylenol dose.  Prescriptions: ULTRAM 50 MG TABS (TRAMADOL HCL) Take one tablet by mouth before bedtime for pain.  #30 x 0   Entered and Authorized by:   Joaquin Courts  MD   Signed by:   Joaquin Courts  MD on 01/18/2009   Method used:   Print then Give to Patient   RxID:   [id number] PROVENTIL HFA 108 (90 BASE) MCG/ACT AERS (ALBUTEROL SULFATE) Take 1-2 puffs every 4 hours as needed for shortness of breath and wheezing.  #1 inhaler x 0   Entered and Authorized by:   Joaquin Courts  MD   Signed by:   Joaquin Courts  MD on 01/18/2009   Method used:   Print then Give to Patient   RxID:   [id number]    Prevention & Chronic Care Immunizations   Influenza vaccine: Fluvax Non-MCR  (12/28/2006)   Influenza vaccine deferral: Refused  (01/18/2009)    Tetanus booster: Not documented    Pneumococcal vaccine: Not documented    H. zoster vaccine: Not documented  Colorectal Screening   Hemoccult: Negative  (10/20/2003)     Colonoscopy: Results: Polyp.  Pt with polyp in transverse colon  (10/03/2006)   Colonoscopy action/deferral: Repeat colonoscopy in 5 years.   (  10/03/2006)   Colonoscopy due: 10/03/2011  Other Screening   Pap smear: Not documented   Pap smear action/deferral: Not indicated S/P hysterectomy  (09/09/2008)    Mammogram: No specific mammographic evidence of malignancy.  Assessment: BIRADS 1.   (09/11/2007)   Mammogram action/deferral: Refused  (01/18/2009)   Mammogram due: 09/10/2009    DXA bone density scan: Not documented   Smoking status: never  (01/18/2009)  Lipids   Total Cholesterol: 247  (11/23/2008)   LDL: 164  (11/23/2008)   LDL Direct: Not documented   HDL: 67  (11/23/2008)   Triglycerides: 79  (11/23/2008)    SGOT (AST): 21  (07/29/2008)   SGPT (ALT): 17  (07/29/2008)   Alkaline phosphatase: 97  (07/29/2008)   Total bilirubin: 0.4  (07/29/2008)    Lipid flowsheet reviewed?: Yes   Progress toward LDL goal: Unchanged  Hypertension   Last Blood Pressure: 136 / 62  (01/18/2009)   Serum creatinine: 0.93  (07/29/2008)   Serum potassium 4.2  (07/29/2008)    Hypertension flowsheet reviewed?: Yes   Progress toward BP goal: At goal  Self-Management Support :    Patient will work on the following items until the next clinic visit to reach self-care goals:     Medications and monitoring: take my medicines every day, bring all of my medications to every visit  (01/18/2009)     Eating: eat foods that are low in salt  (01/18/2009)    Hypertension self-management support: Written self-care plan  (01/18/2009)   Hypertension self-care plan printed.    Lipid self-management support: Not documented

## 2010-03-22 NOTE — Progress Notes (Signed)
Summary: Diagnostic mammogram  Phone Note Outgoing Call   Call placed by: Henderson Cloud,  April 04, 2006 11:30 AM Call placed to: Patient Summary of Call: Called to verify type of insurance pt will acquire by May in order to schedule diagnostic mammogram at [facility]. Pt stated she will be getting Medicare.  Follow-up for Phone Call        Called [facility] to set up appt for diagnostic mammogram. Appt made for Jul 05, 2006 at 1pm. Follow-up by: Henderson Cloud,  April 04, 2006 11:39 AM  Additional Follow-up for Phone Call Additional follow up Details #1::        Called to give pt appt for diagnostic mammogram. She would not take the onformation over the phone, but rather stated that she would talk to the doctor about it when she comes in for her next visit. (When I talked to her previously, she stated reluctance to get a mammogram since she stated that she doeas self-breast examinations and knows she has fibrotic disease. She said she did not feel that she needed a mammogram, but would allow me to go ahead an dmake the appt for her.) Additional Follow-up by: Henderson Cloud,  April 09, 2006 11:00 AM

## 2010-03-22 NOTE — Assessment & Plan Note (Signed)
Summary: est-ck/fu/meds/cfb   Vital Signs:  Patient Profile:   70 Years Old Female Height:     63 inches (160.02 cm) Weight:      172 pounds BMI:     30.58 Temp:     99.1 degrees F oral Pulse rate:   71 / minute BP sitting:   142 / 73  (right arm)  Pt. in pain?   no  Vitals Entered By: Angelina Ok RN (September 17, 2006 9:17 AM)              Is Patient Diabetic? No Nutritional Status BMI of > 30 = obese  Have you ever been in a relationship where you felt threatened, hurt or afraid?No   Does patient need assistance? Functional Status Self care Ambulation Normal   Chief Complaint:  Follow up.  History of Present Illness: PT is a 70 year old AAW with PMH significant for anxiety, hypertension, hyperlipidemia, and GERD presenting for a checkup.  She has no complaints at this time.  Pt reports that she has only been taking one of the pantoprazoles per day instead of the two that was prescribed.  She reports that she has not been having any GERD symptoms.  Pt is inquiring about her cholesterol that was checked a couple of weeks ago.    Current Allergies (reviewed today): No known allergies     Risk Factors: Tobacco use:  never Alcohol use:  no Exercise:  yes    Times per week:  3    Type:  bike Seatbelt use:  100 %   Review of Systems       See HPI   Physical Exam  General:     alert, well-developed, well-nourished, and well-hydrated.   Head:     normocephalic and atraumatic.   Neck:     supple and no masses.   Lungs:     normal respiratory effort, no intercostal retractions, no accessory muscle use, normal breath sounds, no crackles, and no wheezes.   Heart:     normal rate, regular rhythm, no murmur, no gallop, and no rub.   Abdomen:     soft, non-tender, and normal bowel sounds.   Extremities:     No clubbing, cyanosis, edema, or deformity noted with normal full range of motion of all joints.   Neurologic:     alert & oriented X3.   Skin:     turgor  normal, color normal, no rashes, and no suspicious lesions.   Psych:     Oriented X3, normally interactive, good eye contact, not anxious appearing, and not depressed appearing.      Impression & Recommendations:  Problem # 1:  HYPERTENSION (ICD-401.9) Pts blood pressure is 142/73 today.  She reports that her blood pressure has been better recently.  I will not change her medications at this point.  If blood pressure is elevated at follow up I will consider changing her treatment regimen.  Her updated medication list for this problem includes:    Norvasc 10 Mg Tabs (Amlodipine besylate) .Marland Kitchen... Take 1 tablet by mouth once a day    Lisinopril 40 Mg Tabs (Lisinopril) .Marland Kitchen... Take 1 tablet by mouth once a day   Problem # 2:  HYPERLIPIDEMIA (ICD-272.4) Pts FLP revealed that her LDL is now 111 versus the 604 it was previously.  Her risk is less than 1/2 normal.  I will continue the pravachol at the current dose of 80 mg q daily. Pt was encouraged to  maintain proper diet and exercise.  Her updated medication list for this problem includes:    Pravachol 40 Mg Tabs (Pravastatin sodium) .Marland Kitchen... 2 tabs by mouth qday   Problem # 3:  GERD (ICD-530.81) Pt has only been taking one pill of her pantoprazole daily instead of the prescribed 2 pills.  Pt reports that one pill has been controlling her symptoms.  I will go back to once daily pantoprazole. Her updated medication list for this problem includes:    Pantoprazole Sodium 40 Mg Tbec (Pantoprazole sodium) .Marland Kitchen... Take 1 tablet by mouth once a day   Problem # 4:  SCREENING FOR MALIGNANT NEOPLASM, COLON (ICD-V76.51) Pt has screening colonoscopy set up for August 13th.  Will follow up with results and recommendations.   Problem # 5:  PANIC DISORDER (ICD-300.01) PT denies anxiety.  Continue effexor.  Her updated medication list for this problem includes:    Effexor 37.5 Mg Tabs (Venlafaxine hcl) .Marland Kitchen... Take 1 tablet by mouth once a day   Problem # 6:   DEPRESSION (ICD-311) Pt denies depression at this point.  Continue current treatment.  Her updated medication list for this problem includes:    Effexor 37.5 Mg Tabs (Venlafaxine hcl) .Marland Kitchen... Take 1 tablet by mouth once a day   Medications Added to Medication List This Visit: 1)  Pantoprazole Sodium 40 Mg Tbec (Pantoprazole sodium) .... Take 1 tablet by mouth once a day   Patient Instructions: 1)  Please schedule a follow-up appointment in 3 months. 2)  Everything is going well.   3)  Take your medications as prescribed. 4)  Call the clinic if you have any problems.  5)  Make sure you go to your colonoscopy.   Prescriptions: PANTOPRAZOLE SODIUM 40 MG  TBEC (PANTOPRAZOLE SODIUM) Take 1 tablet by mouth once a day  #30 x 5   Entered and Authorized by:   Rufina Falco MD   Signed by:   Rufina Falco MD on 09/17/2006   Method used:   Print then Give to Patient   RxID:   [id number] PRAVACHOL 40 MG TABS (PRAVASTATIN SODIUM) 2 tabs by mouth Qday  #60 x 5   Entered and Authorized by:   Rufina Falco MD   Signed by:   Rufina Falco MD on 09/17/2006   Method used:   Print then Give to Patient   RxID:   [id number] LISINOPRIL 40 MG TABS (LISINOPRIL) Take 1 tablet by mouth once a day  #30 x 5   Entered and Authorized by:   Rufina Falco MD   Signed by:   Rufina Falco MD on 09/17/2006   Method used:   Print then Give to Patient   RxID:   [id number] COLACE 100 MG CAPS (DOCUSATE SODIUM) Take 1 capsule by mouth two times a day  #60 x 5   Entered and Authorized by:   Rufina Falco MD   Signed by:   Rufina Falco MD on 09/17/2006   Method used:   Print then Give to Patient   RxID:   [id number] NORVASC 10 MG TABS (AMLODIPINE BESYLATE) Take 1 tablet by mouth once a day  #30 x 5   Entered and Authorized by:   Rufina Falco MD   Signed by:   Rufina Falco MD on 09/17/2006   Method used:   Print then Give to Patient   RxID:   [id number]

## 2010-03-22 NOTE — Assessment & Plan Note (Signed)
Summary: est-ck/fu/meds/cfb   Vital Signs:  Patient Profile:   70 Years Old Female Height:     63 inches Weight:      176.4 pounds Temp:     97.6 degrees F oral Pulse rate:   73 / minute BP sitting:   131 / 69  (right arm)  Pt. in pain?   no  Vitals Entered By: Theotis Barrio (May 07, 2006 1:49 PM)              Is Patient Diabetic? No Nutritional Status Normal  Does patient need assistance? Functional Status Self care Ambulation Normal   Chief Complaint:  numbness in left hand/ can't afford the protonix / gerd.  History of Present Illness: Pt is a 70 year old AAW with PMH significant for hyperlipidemia, HTN, GERD, constipation, panic attacks, and depression presenting to clinic secondary to GERD and not being able to afford the protonix.  She is also complaining of blurred vision, itchy eyes, and dry eyes.  Pt reports that the dulcolax is helping her with her constipation.  Pt also reports that the lovastatin is now too expensive for her as well.  Pt reports that she will get her insurance around the first week of May.  At this time pt will need a screening colonoscopy and a diagnostic mammogram for a previous abnormal mammogram.   Prior Medications (reviewed today): NORVASC 10 MG TABS (AMLODIPINE BESYLATE) Take 1 tablet by mouth once a day ASPIRIN 81 MG TBEC (ASPIRIN) Take 1 tablet by mouth once a day COLACE 100 MG CAPS (DOCUSATE SODIUM) Take 1 capsule by mouth two times a day EFFEXOR 37.5 MG TABS (VENLAFAXINE HCL) Take 1 tablet by mouth once a day LISINOPRIL 20 MG TABS (LISINOPRIL) Take 1 tablet by mouth once a day DULCOLAX 5 MG TBEC (BISACODYL) Take 1 tablet by mouth two times a day as needed for constipation METAMUCIL 30.9 % POWD (PSYLLIUM) Take 1 teaspoon mixed in 8 oz of water once daily Current Allergies (reviewed today): No known allergies Prescriptions: PRAVACHOL 40 MG TABS (PRAVASTATIN SODIUM) Take 1 tablet by mouth once a day  #30 x 5   Entered and Authorized  by:   Rufina Falco MD   Signed by:   Rufina Falco MD on 05/07/2006   Method used:   Print then Give to Patient   RxID:   [id number] PRILOSEC 20 MG CPDR (OMEPRAZOLE) Take 1 tablet by mouth three times a day  #90 x 5   Entered and Authorized by:   Rufina Falco MD   Signed by:   Rufina Falco MD on 05/07/2006   Method used:   Print then Give to Patient   RxID:   [id number]    Past Medical History:    Hyperlipidemia    Hypertension    Panic disorder    Unstable angina    Constipation    GERD    Depression    Osteoarthritis    palpitations    Hx of dizziness    blurred vision    Risk Factors:  Alcohol use:  no Exercise:  yes    Times per week:  3    Type:  bike Seatbelt use:  100 %   Review of Systems       See HPI   Physical Exam  General:     alert, well-developed, well-nourished, and well-hydrated.   Head:     normocephalic and atraumatic.   Eyes:     pupils reactive to light  and no injection.  Pt appears to be developing bilateral cataracts.  Mouth:     pharynx pink and moist.   Neck:     supple, full ROM, and no masses.   Lungs:     normal respiratory effort, no intercostal retractions, no accessory muscle use, normal breath sounds, no crackles, and no wheezes.   Heart:     normal rate and regular rhythm.   Abdomen:     soft, non-tender, normal bowel sounds, no distention, and no guarding.   Msk:     normal ROM.   Extremities:     bilateral mild pedal edema Neurologic:     alert & oriented X3.   Skin:     turgor normal, color normal, and no rashes.   Psych:     Oriented X3 and slightly anxious.      Impression & Recommendations:  Problem # 1:  BLURRED VISION (ICD-368.8) Pt may be developing bilateral cataracts.  Will refer pt to ophthalmologist.   Orders: Ophthalmology Referral (Ophthalmology)   Problem # 2:  GERD (ICD-530.81) Pt can not afford the protonix so we will go back to the prilosec OTC at a higher dose to see if  this will help her.  Pt was given a pamphlet about reflux and was told some tips on how to limit GERD.  Will try prilosec 20mg  three times a day.  Pt likes the protonix and says it works for her, so when she gets insurance we may go back to it.   The following medications were removed from the medication list:    Protonix 40 Mg Tbec (Pantoprazole sodium) .Marland Kitchen... Take 1 tablet by mouth once a day  Her updated medication list for this problem includes:    Prilosec 20 Mg Cpdr (Omeprazole) .Marland Kitchen... Take 1 tablet by mouth three times a day   Problem # 3:  HYPERTENSION (ICD-401.9) Good control.  Blood pressure today is 131/69.  No change to current treatment plan.   Her updated medication list for this problem includes:    Norvasc 10 Mg Tabs (Amlodipine besylate) .Marland Kitchen... Take 1 tablet by mouth once a day    Lisinopril 20 Mg Tabs (Lisinopril) .Marland Kitchen... Take 1 tablet by mouth once a day   Problem # 4:  CONSTIPATION (ICD-564.00) Pt reports that the dulcolax is working but she is only taking it once a week because she says if she takes it more than that it doesn't work.   Her updated medication list for this problem includes:    Colace 100 Mg Caps (Docusate sodium) .Marland Kitchen... Take 1 capsule by mouth two times a day    Dulcolax 5 Mg Tbec (Bisacodyl) .Marland Kitchen... Take 1 tablet by mouth two times a day as needed for constipation    Metamucil 30.9 % Powd (Psyllium) .Marland Kitchen... Take 1 teaspoon mixed in 8 oz of water once daily   Problem # 5:  HYPERLIPIDEMIA (ICD-272.4) Pt reports that her lovastatin at the increased dose is now too expensive.  Will change her to pravachol 40 mg q daily.  Will recheck a CMET and FLP in six - eight weeks to see how she is doing and decide if she needs a higher dose.  No complaints of myalgia.  The following medications were removed from the medication list:    Lovastatin 40 Mg Tabs (Lovastatin) .Marland Kitchen... Take 1 tablet by mouth once a day  Her updated medication list for this problem includes:     Pravachol 40 Mg Tabs (Pravastatin  sodium) .Marland Kitchen... Take 1 tablet by mouth once a day  Future Orders: T-Lipid Profile ([id number]) ... 06/22/2006 T-Comprehensive Metabolic Panel [phone]) ... 06/22/2006   Problem # 6:  MAMMOGRAM, ABNORMAL, HX OF (ICD-V15.9) Pt will need a diagnostic mammogram when she gets her insurance in May.    Medications Added to Medication List This Visit: 1)  Prilosec 20 Mg Cpdr (Omeprazole) .... Take 1 tablet by mouth three times a day 2)  Pravachol 40 Mg Tabs (Pravastatin sodium) .... Take 1 tablet by mouth once a day   Patient Instructions: 1)  Please schedule a follow-up appointment in 6 weeks to check your FLP, CMET, and to see how you are doing with your GERD and blood pressure. 2)  Please read over the handout about reflux. 3)  I will schedule you an appointment with the eye doctor.  4)  Limit your Sodium (Salt).

## 2010-03-22 NOTE — Assessment & Plan Note (Signed)
Summary: ACUTE WANTS BP CHECK/CHOLES CK/CFB   Vital Signs:  Patient profile:   70 year old female Height:      63 inches (160.02 cm) Weight:      176.2 pounds (79.14 kg) BMI:     30.95 Temp:     97.4 degrees F (36.33 degrees C) oral Pulse rate:   70 / minute BP sitting:   145 / 66  (left arm) Cuff size:   large  Vitals Entered By: Theotis Barrio NT II (November 23, 2008 9:12 AM) CC: REFUSE FLU SHOT  / RIGHT HIP AND KNEE PAIN,  Is Patient Diabetic? No Pain Assessment Patient in pain? yes     Location: RIGHT HIP/KNEE Intensity:     9 Type:   DULL Onset of pain  Chronic Nutritional Status BMI of 25 - 29 = overweight  Have you ever been in a relationship where you felt threatened, hurt or afraid?No   Does patient need assistance? Functional Status Self care Ambulation Normal Comments REFUSE FLU SHOT / RIGHT HIP AND KNEE PAIN   Primary Care Provider:  Darnelle Maffucci MD  CC:  REFUSE FLU SHOT  / RIGHT HIP AND KNEE PAIN and .  History of Present Illness: 70 yr old AAF with PMH as mentioned below comes to the office with CC of right hip pain and right knee pain that started few weeks ago, intermittent, 8/10 in severity, worsened with activity and relieved by nothing. She is currently not taking any medications for her pain and is requesting for some pain meds. She denies any weakness, tingling or numbness in her legs and denies any other complaints.  She also states that she has chronic constipation and was tried on many medications including colace, dulcolax, miralax, psyllium and nothing worked for her. She reports that she has BM every other day and has been the case for many yrs.   Depression History:      The patient denies a depressed mood most of the day and a diminished interest in her usual daily activities.         Preventive Screening-Counseling & Management  Alcohol-Tobacco     Alcohol drinks/day: 0     Smoking Status: never  Caffeine-Diet-Exercise     Does  Patient Exercise: yes     Type of exercise: bike - sometime; weighs     Times/week: 1-2  Medications Prior to Update: 1)  Norvasc 10 Mg Tabs (Amlodipine Besylate) .... Take 1 Tablet By Mouth Once A Day 2)  Aspirin 81 Mg Tbec (Aspirin) .... Take 1 Tablet By Mouth Once A Day 3)  Multivitamins  Tabs (Multiple Vitamin) .... Take 1 Tablet By Mouth Once A Day 4)  Dulcolax 5 Mg Tbec (Bisacodyl) .... Take 1 Tablet By Mouth Two Times A Day As Needed For Constipation 5)  Colace 100 Mg Caps (Docusate Sodium) .... Take 1 Capsule By Mouth Two Times A Day 6)  Metamucil 30.9 % Powd (Psyllium) .... Take 1 Teaspoon Mixed in 8 Oz of Water Once Daily 7)  Pravastatin Sodium 40 Mg Tabs (Pravastatin Sodium) .... Take 2 Tablets By Mouth Once Daily 8)  Ferrous Sulfate 325 (65 Fe) Mg Tabs (Ferrous Sulfate) .... Take 1 Tablet By Mouth Two Times A Day 9)  Omeprazole 20 Mg  Cpdr (Omeprazole) .Marland Kitchen.. 1 Each Day 20-30 Min Prior To Breakfast Meal 10)  Amoxil 500 Mg Caps (Amoxicillin) .... Take 1 Tablet By Mouth Two Times A Day For 7 Days. 11)  Ibuprofen  400 Mg Tabs (Ibuprofen) .... Take 1 Tablet By Mouth Three Times A Day As Needed For Pain 12)  Ambien 5 Mg Tabs (Zolpidem Tartrate) .... Take One Pill Each Night 13)  Amitriptyline Hcl 25 Mg Tabs (Amitriptyline Hcl) .... Take One Pill Each Night 14)  Oscal 500/200 D-3 500-200 Mg-Unit Tabs (Calcium-Vitamin D) .... Take 1 Tablet By Mouth Two Times A Day  Current Medications (verified): 1)  Norvasc 10 Mg Tabs (Amlodipine Besylate) .... Take 1 Tablet By Mouth Once A Day 2)  Aspirin 81 Mg Tbec (Aspirin) .... Take 1 Tablet By Mouth Once A Day 3)  Multivitamins  Tabs (Multiple Vitamin) .... Take 1 Tablet By Mouth Once A Day 4)  Metamucil 30.9 % Powd (Psyllium) .... Take 1 Teaspoon Mixed in 8 Oz of Water Once Daily 5)  Pravastatin Sodium 40 Mg Tabs (Pravastatin Sodium) .... Take 2 Tablets By Mouth Once Daily 6)  Omeprazole 20 Mg  Cpdr (Omeprazole) .Marland Kitchen.. 1 Each Day 20-30 Min Prior To  Breakfast Meal 7)  Ambien 5 Mg Tabs (Zolpidem Tartrate) .... Take One Pill Each Night 8)  Amitriptyline Hcl 25 Mg Tabs (Amitriptyline Hcl) .... Take One Pill Each Night 9)  Oscal 500/200 D-3 500-200 Mg-Unit Tabs (Calcium-Vitamin D) .... Take 1 Tablet By Mouth Two Times A Day 10)  Tylenol Extra Strength 500 Mg Tabs (Acetaminophen) .... Take 1 Tablet By Mouth Four Times A Day As Needed For Pain  Allergies: 1)  ! Ibuprofen  Review of Systems      See HPI  Physical Exam  General:  Well-developed,well-nourished,in no acute distress; alert,appropriate and cooperative throughout examination Head:  Normocephalic and atraumatic without obvious abnormalities. No apparent alopecia or balding. Mouth:  Oral mucosa and oropharynx without lesions or exudates.  Teeth in good repair. Lungs:  normal respiratory effort and normal breath sounds.   Heart:  normal rate, regular rhythm, and no murmur.   Abdomen:  Bowel sounds positive,abdomen soft and non-tender without masses, organomegaly or hernias noted. Msk:  normal right knee and hip inspection and palpation. Movements are slightly restricted at knee secondary to pain. negative SLR test Pulses:  R radial normal.   Extremities:  No clubbing, cyanosis, edema, or deformity noted with normal full range of motion of all joints.   Neurologic:  No cranial nerve deficits noted. Station and gait are normal. Plantar reflexes are down-going bilaterally. DTRs are symmetrical throughout. Sensory, motor and coordinative functions appear intact.   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 As she is not taking any pain meds, will start her on Tylenol extra strength and will follow up. Her symptoms are not severe enough at this stage that require ortho referral. Will try pain management and if it doesn't work , will consider ortho referral. The following medications were removed  from the medication list:    Ibuprofen 400 Mg Tabs (Ibuprofen) .Marland Kitchen... Take 1 tablet by mouth three times a day as needed for pain Her updated medication list for this problem includes:    Aspirin 81 Mg Tbec (Aspirin) .Marland Kitchen... Take 1 tablet by mouth once a day    Tylenol Extra Strength 500 Mg Tabs (Acetaminophen) .Marland Kitchen... Take 1 tablet by mouth four times a day as needed for pain Orders: T-DG Knee 2 Views*R* [phone])   The following medications were removed from the medication list:    Ibuprofen 400 Mg Tabs (Ibuprofen) .Marland Kitchen... Take 1 tablet by mouth three times  a day as needed for pain  Her updated medication list for this problem includes:    Aspirin 81 Mg Tbec (Aspirin) .Marland Kitchen... Take 1 tablet by mouth once a day    Tylenol Extra Strength 500 Mg Tabs (Acetaminophen) .Marland Kitchen... Take 1 tablet by mouth four times a day as needed for pain    Problem # 2:  CONSTIPATION (ICD-564.00)  Current no active issues. Advised her to drink plenty water, fruits, vegetables and to try OTC dietary fibre supplementations. Wil follow up. Will check TSH for completion sake. The following medications were removed from the medication list:    Dulcolax 5 Mg Tbec (Bisacodyl) .Marland Kitchen... Take 1 tablet by mouth two times a day as needed for constipation    Colace 100 Mg Caps (Docusate sodium) .Marland Kitchen... Take 1 capsule by mouth two times a day Her updated medication list for this problem includes:    Metamucil 30.9 % Powd (Psyllium) .Marland Kitchen... Take 1 teaspoon mixed in 8 oz of water once daily Orders: T-TSH ([id number])   The following medications were removed from the medication list:    Dulcolax 5 Mg Tbec (Bisacodyl) .Marland Kitchen... Take 1 tablet by mouth two times a day as needed for constipation    Colace 100 Mg Caps (Docusate sodium) .Marland Kitchen... Take 1 capsule by mouth two times a day  Her updated medication list for this problem includes:    Metamucil 30.9 % Powd (Psyllium) .Marland Kitchen... Take 1 teaspoon mixed in 8 oz of water once daily    Problem # 3:   ANEMIA (ICD-285.9)  Previous Hb was WNL. Will stop iron supplementation and repeat cbc.  Orders: T-CBC No Diff ([id number])   The following medications were removed from the medication list:    Ferrous Sulfate 325 (65 Fe) Mg Tabs (Ferrous sulfate) .Marland Kitchen... Take 1 tablet by mouth two times a day    Problem # 4:  HYPERTENSION (ICD-401.9)  Has not taken her am meds. Will not change her meds. will follow up.  BP today: 145/66 Prior BP: 133/77 (09/09/2008)  Labs Reviewed: K+: 4.2 (07/29/2008) Creat: : 0.93 (07/29/2008)   Chol: 219 (09/11/2007)   HDL: 58 (09/11/2007)   LDL: 146 (09/11/2007)   TG: 73 (09/11/2007)   Her updated medication list for this problem includes:    Norvasc 10 Mg Tabs (Amlodipine besylate) .Marland Kitchen... Take 1 tablet by mouth once a day    Complete Medication List: 1)  Norvasc 10 Mg Tabs (Amlodipine besylate) .... Take 1 tablet by mouth once a day 2)  Aspirin 81 Mg Tbec (Aspirin) .... Take 1 tablet by mouth once a day 3)  Multivitamins Tabs (Multiple vitamin) .... Take 1 tablet by mouth once a day 4)  Metamucil 30.9 % Powd (Psyllium) .... Take 1 teaspoon mixed in 8 oz of water once daily 5)  Pravastatin Sodium 40 Mg Tabs (Pravastatin sodium) .... Take 2 tablets by mouth once daily 6)  Omeprazole 20 Mg Cpdr (Omeprazole) .Marland Kitchen.. 1 each day 20-30 min prior to breakfast meal 7)  Ambien 5 Mg Tabs (Zolpidem tartrate) .... Take one pill each night 8)  Amitriptyline Hcl 25 Mg Tabs (Amitriptyline hcl) .... Take one pill each night 9)  Oscal 500/200 D-3 500-200 Mg-unit Tabs (Calcium-vitamin d) .... Take 1 tablet by mouth two times a day 10)  Tylenol Extra Strength 500 Mg Tabs (Acetaminophen) .... Take 1 tablet by mouth four times a day as needed for pain T-Lipid Profile [phone] T-CBC No Diff ([id number]) T-DG Knee 2 Views*R* ([id number])  Patient  Instructions: 1)  Please schedule a follow-up appointment in 1 month. 2)  Take Over the counter dietary fibres daily as  instructed. 3)  Use 'Tylenol for arthritis' as needed for your right knee pain.  4)  It is important that you exercise regularly at least 20 minutes 5 times a week. If you develop chest pain, have severe difficulty breathing, or feel very tired , stop exercising immediately and seek medical attention. 5)  You need to lose weight. Consider a lower calorie diet and regular exercise.  Prescriptions: TYLENOL EXTRA STRENGTH 500 MG TABS (ACETAMINOPHEN) Take 1 tablet by mouth four times a day as needed for pain  #60 x 3   Entered and Authorized by:   Blondell Reveal MD   Signed by:   Blondell Reveal MD on 11/23/2008   Method used:   Print then Give to Patient   RxID:   [id number]  Process Orders Check Orders Results:     [facility]: ABN not required for this insurance Tests Sent for requisitioning (November 23, 2008 10:26 PM):     11/23/2008: [facility] -- T-Lipid Profile [phone] (signed)     11/23/2008: [facility] -- T-CBC No Diff [[id number]] (signed)     11/23/2008: [facility] -- T-TSH [phone] (signed)    Process Orders Check Orders Results:     [facility]: ABN not required for this insurance Tests Sent for requisitioning (November 23, 2008 10:26 PM):     11/23/2008: [facility] -- T-Lipid Profile [phone] (signed)     11/23/2008: [facility] -- T-CBC No Diff [[id number]] (signed)     11/23/2008: [facility] -- T-TSH [[id number]] (signed)     Impression & Recommendations: The following medications were removed from the medication list:    Ibuprofen 400 Mg Tabs (Ibuprofen) .Marland Kitchen... Take 1 tablet by mouth three times a day as needed for pain Her updated medication list for this problem includes:    Aspirin 81 Mg Tbec (Aspirin) .Marland Kitchen... Take 1 tablet by mouth once a day    Tylenol Extra Strength 500 Mg Tabs (Acetaminophen) .Marland Kitchen... Take 1 tablet  by mouth four times a day as needed for pain  Orders: T-DG Knee 2 Views*R* [phone])   The following medications were removed from the medication list:    Ibuprofen 400 Mg Tabs (Ibuprofen) .Marland Kitchen... Take 1 tablet by mouth three times a day as needed for pain  Her updated medication list for this problem includes:    Aspirin 81 Mg Tbec (Aspirin) .Marland Kitchen... Take 1 tablet by mouth once a day    Tylenol Extra Strength 500 Mg Tabs (Acetaminophen) .Marland Kitchen... Take 1 tablet by mouth four times a day as needed for pain  The following medications were removed from the medication list:    Dulcolax 5 Mg Tbec (Bisacodyl) .Marland Kitchen... Take 1 tablet by mouth two times a day as needed for constipation    Colace 100 Mg Caps (Docusate sodium) .Marland Kitchen... Take 1 capsule by mouth two times a day Her updated medication list for this problem includes:    Metamucil 30.9 % Powd (Psyllium) .Marland Kitchen... Take 1 teaspoon mixed in 8 oz of water once daily  Orders: T-TSH ([id number])   The following medications were removed from the medication list:    Dulcolax 5 Mg Tbec (Bisacodyl) .Marland Kitchen... Take 1 tablet by mouth two times a day as needed for constipation    Colace 100 Mg Caps (Docusate sodium) .Marland Kitchen... Take 1 capsule  by mouth two times a day  Her updated medication list for this problem includes:    Metamucil 30.9 % Powd (Psyllium) .Marland Kitchen... Take 1 teaspoon mixed in 8 oz of water once daily   Her updated medication list for this problem includes:    Ferrous Sulfate 325 (65 Fe) Mg Tabs (Ferrous sulfate) .Marland Kitchen... Take 1 tablet by mouth two times a day   Orders: T-CBC No Diff ([id number])   The following medications were removed from the medication list:    Ferrous Sulfate 325 (65 Fe) Mg Tabs (Ferrous sulfate) .Marland Kitchen... Take 1 tablet by mouth two times a day   Her updated medication list for this problem includes:    Norvasc 10 Mg Tabs (Amlodipine besylate) .Marland Kitchen... Take 1 tablet by mouth once a day   Complete Medication List: 1)  Norvasc 10 Mg Tabs  (Amlodipine besylate) .... Take 1 tablet by mouth once a day 2)  Aspirin 81 Mg Tbec (Aspirin) .... Take 1 tablet by mouth once a day 3)  Multivitamins Tabs (Multiple vitamin) .... Take 1 tablet by mouth once a day 4)  Metamucil 30.9 % Powd (Psyllium) .... Take 1 teaspoon mixed in 8 oz of water once daily 5)  Pravastatin Sodium 40 Mg Tabs (Pravastatin sodium) .... Take 2 tablets by mouth once daily 6)  Omeprazole 20 Mg Cpdr (Omeprazole) .Marland Kitchen.. 1 each day 20-30 min prior to breakfast meal 7)  Ambien 5 Mg Tabs (Zolpidem tartrate) .... Take one pill each night 8)  Amitriptyline Hcl 25 Mg Tabs (Amitriptyline hcl) .... Take one pill each night 9)  Oscal 500/200 D-3 500-200 Mg-unit Tabs (Calcium-vitamin d) .... Take 1 tablet by mouth two times a day 10)  Tylenol Extra Strength 500 Mg Tabs (Acetaminophen) .... Take 1 tablet by mouth four times a day as needed for pain  Other Orders: T-Lipid Profile [phone])

## 2010-03-22 NOTE — Assessment & Plan Note (Signed)
Summary: CHECKUP/ SB.   Vital Signs:  Patient Profile:   70 Years Old Female Weight:      175.6 pounds Temp:     97.0 degrees F oral Pulse rate:   77 / minute BP sitting:   150 / 75  (right arm)  Pt. in pain?   yes    Location:   chest    Intensity:   4    Type:       pressing  Vitals Entered By: Henderson Cloud (March 23, 2006 10:30 AM)              Is Patient Diabetic? No Nutritional Status Normal  Have you ever been in a relationship where you felt threatened, hurt or afraid?No   Does patient need assistance? Functional Status Self care Ambulation Normal      Chief Complaint:  check-up visit and needs protonix refill.  History of Present Illness: Pt is a 70 year old AAW with PMH significant for hyperlipidemia, HTN, Constipation, GERD, panic disorder, and depression presenting to clinic with a substernal pressing under her sternum.  She denies any actual pain.  Pt reports that she has not been taking her medications for GERD.  Pain does not sound  cardiac in nature.  Of note pt was recently hopitalized for palpitations and had a negative work up including a Counselling psychologist.  She associates her problem with increased gas, indigestion, and food.  Pt also reports that she has not been taking her norvasc and that her BP at home has been well controlled.   Pt is also complaining of some constipation.  Last reported BM was yesterday and was normal.  Pt denies hematochezia and melena.  No other complaints.    Acute Visit History:      The patient complains of chest pain, constipation, and eye symptoms.  She denies abdominal pain, cough, diarrhea, fever, and headache.        Prior Medications: NORVASC 10 MG TABS (AMLODIPINE BESYLATE) Take 1 tablet by mouth once a day ASPIRIN 81 MG TBEC (ASPIRIN) Take 1 tablet by mouth once a day EFFEXOR 37.5 MG TABS (VENLAFAXINE HCL) Take 1 tablet by mouth once a day LISINOPRIL 20 MG TABS (LISINOPRIL) Take 1 tablet by mouth once a day Current  Allergies (reviewed today): No known allergies     Risk Factors:  Tobacco use:  never   Review of Systems       The patient complains of chest pain, constipation, gas/bloating, and indigestion/heartburn.  The patient denies fever, peripheral edema, cough, dyspnea at rest, nausea, vomiting, diarrhea, abdominal pain, melena, and hematochezia.     Physical Exam  General:     alert, well-developed, and well-nourished.   Head:     normocephalic and atraumatic.   Eyes:     pupils equal, pupils round, and pupils reactive to light.   Ears:     ear piercing(s) noted.   Neck:     supple.   Chest Wall:     no tenderness.   Lungs:     normal respiratory effort, no accessory muscle use, normal breath sounds, no dullness, no crackles, and no wheezes.   Heart:     normal rate, regular rhythm, no gallop, no rub, no JVD, and Grade  1 /6 systolic ejection murmur.   Abdomen:     soft, non-tender, normal bowel sounds, no distention, no masses, no guarding, and no rigidity.   Msk:     normal ROM.  Extremities:     No clubbing, cyanosis, edema, or deformity noted with normal full range of motion of all joints.   Neurologic:     alert & oriented X3.   Psych:     Oriented X3.      Impression & Recommendations:  Problem # 1:  GERD (ICD-530.81) Pt's CP sounds like reflux and indigestion.  Likely secondary to not taking her protonix for a month now.  Pt was worked up recently for palpitations with a negative work up.  Pt's discomfort may also be secondary to some constipation.  Will refill patients prescription for protonix.  Pt was recently switched to prilosec secondary to cost, but pt does not think it is working for her, so she wishes to go back to protonix.  The following medications were removed from the medication list:    Prilosec 20 Mg Cpdr (Omeprazole) .Marland Kitchen... Take 1 tablet by mouth once a day  Her updated medication list for this problem includes:    Protonix 40 Mg Tbec  (Pantoprazole sodium) .Marland Kitchen... Take 1 tablet by mouth once a day   Problem # 2:  CONSTIPATION (ICD-564.00) Pt was taking colace one time per day with little help.  Will increase her colace to 100 mg two times a day, and will also start her on Metamucil, and tell her to try some dulcolax.  Pt informed to call if this does not help her. Her updated medication list for this problem includes:    Colace 100 Mg Caps (Docusate sodium) .Marland Kitchen... Take 1 capsule by mouth two times a day    Dulcolax 5 Mg Tbec (Bisacodyl) .Marland Kitchen... Take 1 tablet by mouth two times a day as needed for constipation    Metamucil 30.9 % Powd (Psyllium) .Marland Kitchen... Take 1 teaspoon mixed in 8 oz of water once daily   Problem # 3:  HYPERLIPIDEMIA (ICD-272.4) Pt was put on lovastatin secondary to expense.  We will check a CMET and FLP in two weeks to see if pt's dose will need to be increased, and to make sure the change in medication will not affect her liver. The following medications were removed from the medication list:    Lipitor 40 Mg Tabs (Atorvastatin calcium) .Marland Kitchen... Take 1 tablet by mouth once a day  Her updated medication list for this problem includes:    Lovastatin 20 Mg Tabs (Lovastatin) .Marland Kitchen... Take 1 tablet by mouth once a day  Future Orders: T-Comprehensive Metabolic Panel ([id number]) ... 04/02/2006 T-Lipid Profile [phone]) ... 04/02/2006   Problem # 4:  HYPERTENSION (ICD-401.9) Pt has not been taking her Norvasc.  Pt reports that her BP at home has been very well controlled on the lisinopril.  However pts BP today is elevated.   I wonder if this could have a component of white coat hypertension.  Pt instructed to take both medications and see how she does.  Will check her BP at next visit and make changes if needed.   Her updated medication list for this problem includes:    Norvasc 10 Mg Tabs (Amlodipine besylate) .Marland Kitchen... Take 1 tablet by mouth once a day    Lisinopril 20 Mg Tabs (Lisinopril) .Marland Kitchen... Take 1 tablet by mouth  once a day   Problem # 5:  Screening Breast Cancer (ICD-V76.10) Pt will be scheduled for a mammogram sometime after May per patients request.  Pt has not had one since sometime in 2006.  Medications Added to Medication List This Visit: 1)  Colace 100 Mg Caps (  Docusate sodium) .... Take 1 capsule by mouth two times a day 2)  Lovastatin 20 Mg Tabs (Lovastatin) .... Take 1 tablet by mouth once a day 3)  Protonix 40 Mg Tbec (Pantoprazole sodium) .... Take 1 tablet by mouth once a day 4)  Dulcolax 5 Mg Tbec (Bisacodyl) .... Take 1 tablet by mouth two times a day as needed for constipation 5)  Metamucil 30.9 % Powd (Psyllium) .... Take 1 teaspoon mixed in 8 oz of water once daily  Other Orders: Mammogram (Mammogram)   Patient Instructions: 1)  Please schedule a follow-up appointment in 3 months. 2)  Keep lab appointment on Monday February 11. Be sure not to eat or drink anything after midnight the night before. You may drink water and take your medications the morning of your lab work. 3)  Get your new medications and your refills done. 4)  Call the office if you have any problems with your new medications.   Appended Document: Orders Update    Clinical Lists Changes  Problems: Added new problem of MAMMOGRAM, ABNORMAL, HX OF (ICD-V15.9) Orders: Added new Test order of Mammogram (Mammogram) - Signed

## 2010-03-22 NOTE — Assessment & Plan Note (Signed)
Summary: np6/palps/doe      Allergies Added:   Visit Type:  Follow-up Primary Provider:  Darnelle Maffucci MD  CC:  occ chest pain.  History of Present Illness: 70 yo with history of HTN and hyperlipidemia presents for evaluation of chest pain.  Patient has pain in her central chest that occurs about 1-2 times a month.  She has been getting this sensation for years.  It seems to be brought on by stress or anxiety and is not related to exertion or meals.  She had a cath in 2007 with normal coronaries.  She also reports swelling in her ankles on and off for years.  This tends to get worse when she goes on vacation and eats out in restaurants.  She also has been short of breath when walking up hills for a number of years.  She gets mildly short of breath with a flight of steps.  She has no trouble on flat ground.    ECG: NSR, PAC, nonspecific T wave changes.   Labs (8/11): BNP < 30, HCT 36, TSH normal, K 4.2, creatinine 0.96 Labs (2/11): LDL 159    Current Medications (verified): 1)  Norvasc 10 Mg Tabs (Amlodipine Besylate) .... Take 1 Tablet By Mouth Once A Day 2)  Aspirin 81 Mg Tbec (Aspirin) .... Take 1 Tablet By Mouth Once A Day 3)  Multivitamins  Tabs (Multiple Vitamin) .... Take 1 Tablet By Mouth Once A Day 4)  Metamucil 30.9 % Powd (Psyllium) .... Take 1 Teaspoon Mixed in 8 Oz of Water Once Daily 5)  Simvastatin 40 Mg Tabs (Simvastatin) .... Take 1 Tablet By Mouth Once A Day 6)  Omeprazole 20 Mg  Cpdr (Omeprazole) .Marland Kitchen.. 1 Each Day 20-30 Min Prior To Breakfast Meal 7)  Tylenol Extra Strength 500 Mg Tabs (Acetaminophen) .... Take 2 Tablets By Mouth Three Times A Day As Needed For Pain 8)  Miralax  Powd (Polyethylene Glycol 3350) .... Mix 17g With Glass of Water and Drink Once Daily. 9)  Hydroxyzine Hcl 25 Mg/ml Soln (Hydroxyzine Hcl) .... One Tablet Every 6 Hours As Needed For Itching 10)  Ammonium Lactate 12 % Lotn (Ammonium Lactate) .... Apply To Skin Three Times A Day As Needed For  Itching  Allergies (verified): 1)  Ibuprofen  Past History:  Past Medical History: 1. Hyperlipidemia 2. Hypertension 3. Panic disorder 4. Cath 2007: LVEF 65%, Right dominant system with patent coronary arteries 5. Constipation 6. GERD 7. Depression 8. Osteoarthritis  -- R knee meniscal repair 2005 9. Palpitations 10. Anemia 11. Bilateral cataracts. 12. colonic tubular adenoma, August 2008, repeat colonoscopy August 2013  Family History: no colon cancer No premature CAD.   Social History: Nonsmoker, lives in [place], retired Lawyer.  Does not drink alcohol, does not drink caffeinated beverages.  Review of Systems       All systems reviewed and negative except as per HPIl.   Vital Signs:  Patient profile:   70 year old female Height:      63 inches Weight:      179 pounds BMI:     31.82 Pulse rate:   76 / minute BP sitting:   140 / 74  (left arm) Cuff size:   large  Vitals Entered By: Burnett Kanaris, CNA (October 21, 2009 10:17 AM)  Physical Exam  General:  Well developed, well nourished, in no acute distress. Head:  normocephalic and atraumatic Nose:  no deformity, discharge, inflammation, or lesions Mouth:  Teeth, gums and palate  normal. Oral mucosa normal. Neck:  Neck supple, no JVD. No masses, thyromegaly or abnormal cervical nodes. Lungs:  Clear bilaterally to auscultation and percussion. Heart:  Non-displaced PMI, chest non-tender; regular rate and rhythm, S1, S2. +S4.  1/6 systolic murmur LLSB. Carotid upstroke normal, no bruit.  Pedals normal pulses. No edema, no varicosities. Abdomen:  Bowel sounds positive; abdomen soft and non-tender without masses, organomegaly, or hernias noted. No hepatosplenomegaly. Msk:  Back normal, normal gait. Muscle strength and tone normal. Extremities:  No clubbing or cyanosis. Neurologic:  Alert and oriented x 3. Skin:  Intact without lesions or rashes. Psych:  Normal affect.   Impression &  Recommendations:  Problem # 1:  CHEST PAIN (ICD-786.50) Atypical chest pain.  This has been present for a number of years in a fairly stable pattern and is not exertional.  She also has stable exertional dyspnea with stairs or an incline.  She had a negative cath in 2007.  Given her risk factors, I will arrange for a stress echocardiogram.    Problem # 2:  HYPERLIPIDEMIA (ICD-272.4) LDL was 159 in 2/11.  She is on simvastatin 40.  Will repeat fasting lipids.  If not improved, should get more potent statin.   Other Orders: Stress Echo (Stress Echo)  Patient Instructions: 1)  Your physician has requested that you have a stress echocardiogram. For further information please visit [email or web address]  Please follow instruction sheet as given. 2)  Your physician recommends that you return for a FASTING lipid profile/liver profile--786.50  786. 09 3)  Your physician wants you to follow-up in: 1 year with Dr Shirlee Latch.  You will receive a reminder letter in the mail two months in advance. If you don't receive a letter, please call our office to schedule the follow-up appointment.

## 2010-03-22 NOTE — Progress Notes (Signed)
Summary: med refill/gp  Phone Note Refill Request Message from:  Fax from Pharmacy on November 30, 2009 11:21 AM  Refills Requested: Medication #1:  SIMVASTATIN 40 MG TABS Take 1 tablet by mouth once a day   Last Refilled: 10/04/2009 Next appt.10/17.   Method Requested: Electronic Initial call taken by: Chinita Pester RN,  November 30, 2009 11:21 AM    Prescriptions: SIMVASTATIN 40 MG TABS (SIMVASTATIN) Take 1 tablet by mouth once a day  #30 x 3   Entered and Authorized by:   Darnelle Maffucci MD   Signed by:   Darnelle Maffucci MD on 11/30/2009   Method used:   Electronically to        [facility] [address] [id number]* (retail)       [address] [place], [place]  [address]       Ph: [phone]       Fax: [phone]   RxID:   [id number]

## 2010-03-22 NOTE — Progress Notes (Signed)
Summary: phone/gg  Phone Note Call from Patient   Caller: Patient Summary of Call: Pt works for [employer] care and they asking her to have a routine screening chest xray.  Pt does not tolerate PPD. Can you order this for her? Pt # [id number] Initial call taken by: Merrie Roof RN,  September 04, 2008 11:57 AM  Follow-up for Phone Call        Please give her a clinic appointment next week so an MD can see her before ordering a CXR. Follow-up by: Margarito Liner MD,  September 04, 2008 2:16 PM  Additional Follow-up for Phone Call Additional follow up Details #1::        appointment made and pt aware Additional Follow-up by: Merrie Roof RN,  September 07, 2008 9:20 AM

## 2010-03-22 NOTE — Assessment & Plan Note (Signed)
Summary: flu vaccine only//kg   Vital Signs:  Patient Profile:   70 Years Old Female Height:     63 inches (160.02 cm) Weight:      173.7 pounds (78.95 kg) Temp:     97.9 degrees F (36.61 degrees C) oral Pulse rate:   73 / minute BP sitting:   133 / 72  (left arm)  Vitals Entered By: Krystal Eaton Duncan Dull) (December 28, 2006 9:52 AM)                 Chief Complaint:  nurse visit for flu shot only.  Current Allergies: No known allergies         Complete Medication List: 1)  Norvasc 10 Mg Tabs (Amlodipine besylate) .... Take 1 tablet by mouth once a day 2)  Aspirin 81 Mg Tbec (Aspirin) .... Take 1 tablet by mouth once a day 3)  Colace 100 Mg Caps (Docusate sodium) .... Take 1 capsule by mouth two times a day 4)  Effexor 37.5 Mg Tabs (Venlafaxine hcl) .... Take 1 tablet by mouth once a day 5)  Lisinopril 40 Mg Tabs (Lisinopril) .... Take 1 tablet by mouth once a day 6)  Dulcolax 5 Mg Tbec (Bisacodyl) .... Take 1 tablet by mouth two times a day as needed for constipation 7)  Metamucil 30.9 % Powd (Psyllium) .... Take 1 teaspoon mixed in 8 oz of water once daily 8)  Pravachol 40 Mg Tabs (Pravastatin sodium) .... 2 tabs by mouth qday 9)  Pantoprazole Sodium 40 Mg Tbec (Pantoprazole sodium) .... Take 1 tablet by mouth once a day     ]  Influenza Vaccine    Vaccine Type: Fluvax Non-MCR    Site: left deltoid    Mfr: novartis    Dose: 0.5 ml    Route: IM    Given by: Krystal Eaton (AAMA)    Exp. Date: 08/20/2007    Lot #: [id number]    VIS given: 08/19/04 version given December 28, 2006.  Flu Vaccine Consent Questions    Do you have a history of severe allergic reactions to this vaccine? no    Any prior history of allergic reactions to egg and/or gelatin? no    Do you have a sensitivity to the preservative Thimersol? no    Do you have a past history of Guillan-Barre Syndrome? no    Do you currently have an acute febrile illness? no    Have you ever had a severe  reaction to latex? no    Vaccine information given and explained to patient? yes    Are you currently pregnant? no

## 2010-03-22 NOTE — Progress Notes (Signed)
Summary: Lovastatin change  Phone Note Outgoing Call Call back at [facility] Phone [phone]   Call placed by: Henderson Cloud,  April 03, 2006 4:34 PM Summary of Call: Called pt to inform her that her cholesterol is still elevated and that Dr. Maple Hudson wishes to increase her Lovastating from 20 mg to 40 mg. I asked which pharmacy she would like for me to call.She stated that she uses [facility] on [address] and that she will go pick it up tomorrow.  Follow-up for Phone Call        Called in Rx change to [facility] on [address] Follow-up by: Henderson Cloud,  April 03, 2006 4:41 PM

## 2010-03-22 NOTE — Progress Notes (Signed)
Summary: Ferrous Sulfate.  Phone Note Outgoing Call   Call placed by: Angelina Ok RN,  February 28, 2008 2:51 PM Call placed to: Patient Summary of Call: Call to pt to notify of the need to start Ferrous Sulfate 325 (Iron) 1 tablet by mouth two times a day.  Prescription was sent to [facility] per order of Dr. Maple Hudson.  Pt voiced understanding of plan.

## 2010-03-22 NOTE — Assessment & Plan Note (Signed)
Summary: AEST-CK/FU/CFB   Vital Signs:  Patient profile:   70 year old female Height:      63 inches (160.02 cm) Weight:      177.8 pounds (80.82 kg) BMI:     31.61 Temp:     97.9 degrees F (36.61 degrees C) oral Pulse rate:   74 / minute BP sitting:   123 / 70  (right arm)  Vitals Entered By: Stanton Kidney Ditzler RN (September 20, 2009 3:44 PM) Is Patient Diabetic? No Pain Assessment Patient in pain? yes     Location: stomach Intensity: 8 Type: gas Onset of pain  since  09/18/09 - eat cabbage Nutritional Status BMI of > 30 = obese Nutritional Status Detail appetite good  Have you ever been in a relationship where you felt threatened, hurt or afraid?denies   Does patient need assistance? Functional Status Self care Ambulation Normal Comments Since 09/18/09 - fast heart rate and feels inc gas in stomach - eat cabbage. Occ pains in head for years.   Primary Care Provider:  Darnelle Maffucci MD   History of Present Illness: Pt is a 70 yo female w/ past med hx below here for routine f/u.  She notes the arthritis in her knees is better w/ tylenol.  She denies falls and memory troubles.  She has not taken the new omeprazole tablets b/c they are green and she is suspicious that they aren't as good as the name brand.  She notes "I feel great."   c/o of itching when laying in bed, or sitting on couch. this have been ongoing for several months now, and patient does not have any visible rash from this.   Also c/o constipation, which has also been ongoing.  Has ran out of her statins, would like refill.   She denies any CP,SOB,cough,AP,N,V,diarrhea or any other complaints.   Depression History:      The patient denies a depressed mood most of the day and a diminished interest in her usual daily activities.         Preventive Screening-Counseling & Management  Alcohol-Tobacco     Alcohol drinks/day: 0     Smoking Status: never  Caffeine-Diet-Exercise     Does Patient Exercise: yes  Type of exercise: bike - sometime; weighs     Times/week: 1-2  Allergies: 1)  Ibuprofen  Review of Systems       Per HPI  Physical Exam  General:  alert, pleasant, well groomed, no distress.  Lungs:  Normal respiratory effort, chest expands symmetrically. Lungs are clear to auscultation, no crackles or wheezes. Heart:  Normal rate and regular rhythm. Abdomen:  +BS's, soft, NT and ND. Msk:  no joint swelling, no joint warmth, and no redness over joints.    Extremities:  no edema.  Neurologic:  alert & oriented X3, cranial nerves II-XII intact, strength normal in all extremities, sensation intact to light touch, and gait normal.     Skin:   turgor normal and no rashes.  Psych:  mood euthymic.   Impression & Recommendations:  Problem # 1:  PALPITATIONS (ICD-785.1) ongoing, she states that it was due to cabbage!!, will check EKG, and will do routine labs on her next followup.  Orders: 12 Lead EKG (12 Lead EKG)  Problem # 2:  PRURITUS (ICD-698.9) Resolved  Problem # 3:  HYPERTENSION (ICD-401.9) Well controlled on current treatment, No new changes made today, Will continue to monitor.   Her updated medication list for this problem includes:  Norvasc 10 Mg Tabs (Amlodipine besylate) .Marland Kitchen... Take 1 tablet by mouth once a day  BP today: 123/70 Prior BP: 134/71 (08/02/2009)  Labs Reviewed: K+: 4.4 (04/14/2009) Creat: : 0.84 (04/14/2009)   Chol: 239 (04/14/2009)   HDL: 63 (04/14/2009)   LDL: 159 (04/14/2009)   TG: 84 (04/14/2009)  Complete Medication List: 1)  Norvasc 10 Mg Tabs (Amlodipine besylate) .... Take 1 tablet by mouth once a day 2)  Aspirin 81 Mg Tbec (Aspirin) .... Take 1 tablet by mouth once a day 3)  Multivitamins Tabs (Multiple vitamin) .... Take 1 tablet by mouth once a day 4)  Metamucil 30.9 % Powd (Psyllium) .... Take 1 teaspoon mixed in 8 oz of water once daily 5)  Simvastatin 40 Mg Tabs (Simvastatin) .... Take 1 tablet by mouth once a day 6)  Omeprazole 20  Mg Cpdr (Omeprazole) .Marland Kitchen.. 1 each day 20-30 min prior to breakfast meal 7)  Oscal 500/200 D-3 500-200 Mg-unit Tabs (Calcium-vitamin d) .... Take 1 tablet by mouth two times a day 8)  Tylenol Extra Strength 500 Mg Tabs (Acetaminophen) .... Take 2 tablets by mouth three times a day as needed for pain 9)  Proventil Hfa 108 (90 Base) Mcg/act Aers (Albuterol sulfate) .... Take 1-2 puffs every 4 hours as needed for shortness of breath and wheezing. 10)  Ferrous Sulfate 325 (65 Fe) Mg Tabs (Ferrous sulfate) .... Take 1 tablet by mouth two times a day 11)  Miralax Powd (Polyethylene glycol 3350) .... Mix 17g with glass of water and drink once daily. 12)  Hydroxyzine Hcl 25 Mg/ml Soln (Hydroxyzine hcl) .... One tablet every 6 hours as needed for itching 13)  Ammonium Lactate 12 % Lotn (Ammonium lactate) .... Apply to skin three times a day as needed for itching   Prevention & Chronic Care Immunizations   Influenza vaccine: Fluvax Non-MCR  (12/28/2006)   Influenza vaccine deferral: Refused  (01/18/2009)    Tetanus booster: Not documented    Pneumococcal vaccine: Not documented    H. zoster vaccine: Not documented  Colorectal Screening   Hemoccult: Negative  (10/20/2003)    Colonoscopy: Results: Polyp.  Pt with polyp in transverse colon  (10/03/2006)   Colonoscopy action/deferral: Repeat colonoscopy in 5 years.   (10/03/2006)   Colonoscopy due: 10/03/2011  Other Screening   Pap smear: Not documented   Pap smear action/deferral: Not indicated S/P hysterectomy  (09/09/2008)    Mammogram: ASSESSMENT: Benign - BI-RADS 2^MM DIGITAL SCREENING  (05/07/2009)   Mammogram action/deferral: Ordered  (04/14/2009)   Mammogram due: 09/10/2009    DXA bone density scan: Not documented   DXA bone density action/deferral: Refused  (04/14/2009)   Smoking status: never  (09/20/2009)  Lipids   Total Cholesterol: 239  (04/14/2009)   LDL: 159  (04/14/2009)   LDL Direct: Not documented   HDL: 63   (04/14/2009)   Triglycerides: 84  (04/14/2009)    SGOT (AST): 18  (04/14/2009)   SGPT (ALT): 16  (04/14/2009)   Alkaline phosphatase: 83  (04/14/2009)   Total bilirubin: 0.4  (04/14/2009)    Lipid flowsheet reviewed?: Yes   Progress toward LDL goal: At goal  Hypertension   Last Blood Pressure: 123 / 70  (09/20/2009)   Serum creatinine: 0.84  (04/14/2009)   Serum potassium 4.4  (04/14/2009)    Hypertension flowsheet reviewed?: Yes   Progress toward BP goal: At goal  Self-Management Support :   Personal Goals (by the next clinic visit) :  Personal blood pressure goal: 140/90  (08/02/2009)     Personal LDL goal: 100  (08/02/2009)    Patient will work on the following items until the next clinic visit to reach self-care goals:     Medications and monitoring: check my blood pressure, bring all of my medications to every visit, weigh myself weekly  (09/20/2009)     Eating: drink diet soda or water instead of juice or soda, eat more vegetables, use fresh or frozen vegetables, eat foods that are low in salt, eat baked foods instead of fried foods, eat fruit for snacks and desserts, limit or avoid alcohol  (09/20/2009)     Activity: take a 30 minute walk every day, take the stairs instead of the elevator  (09/20/2009)    Hypertension self-management support: Written self-care plan, Education handout, Resources for patients handout  (09/20/2009)   Hypertension self-care plan printed.   Hypertension education handout printed    Lipid self-management support: Written self-care plan, Education handout, Resources for patients handout  (09/20/2009)   Lipid self-care plan printed.   Lipid education handout printed      Resource handout printed.  Appended Document: AEST-CK/FU/CFB   Patient Instructions: 1)  Please schedule a follow-up appointment in 2 weeks.

## 2010-03-22 NOTE — Procedures (Signed)
Summary: COLON  [facility].: Colonoscopy   Imported By: Florinda Marker 12/27/2007 15:25:04  _____________________________________________________________________  External Attachment:    Type:   Image     Comment:   External Document  Appended Document: [facility].: Colonoscopy    Clinical Lists Changes  Observations: Added new observation of COLONRECACT: Repeat colonoscopy in 5 years.  (10/03/2006 21:47) Added new observation of COLONOSCOPY: Results: Polyp.  Pt with polyp in transverse colon (10/03/2006 21:47)       Colonoscopy  Procedure date:  10/03/2006  Findings:      Results: Polyp.  Pt with polyp in transverse colon  Comments:      Repeat colonoscopy in 5 years.

## 2010-03-22 NOTE — Assessment & Plan Note (Signed)
Summary: chest pain/gg see note   Vital Signs:  Patient profile:   70 year old female Height:      63 inches (160.02 cm) Weight:      182.7 pounds (83.05 kg) BMI:     32.48 Temp:     98.9 degrees F oral Pulse rate:   69 / minute BP sitting:   122 / 64  (right arm) Cuff size:   large  Vitals Entered By: Chinita Pester RN (January 11, 2010 1:53 PM) CC: Pressure in her chest and SOB when walking since yesterday. Restess when sleeping. Is Patient Diabetic? No Pain Assessment Patient in pain? no      Nutritional Status BMI of > 30 = obese  Have you ever been in a relationship where you felt threatened, hurt or afraid?No   Does patient need assistance? Functional Status Self care Ambulation Normal   Primary Care Provider:  Darnelle Maffucci, MD  CC:  Pressure in her chest and SOB when walking since yesterday. Restess when sleeping.Marland Kitchen  History of Present Illness: Patient c/o left-sided CP that started yeaterday after a 12 hour train ride from [place]. Pain is aggravated with rotation of her trunk and relieved with rest. Denies any fever, chills, SOB, diaphoresis, palpitations, abdominal pai , orthopnea, PND, or leg swelling. Hx of Cp in the past. stress ECHO done in August of 2011-> negative for CAD.   Depression History:      The patient denies a depressed mood most of the day and a diminished interest in her usual daily activities.         Preventive Screening-Counseling & Management  Alcohol-Tobacco     Alcohol drinks/day: 0     Smoking Status: never     Passive Smoke Exposure: yes  Caffeine-Diet-Exercise     Does Patient Exercise: yes     Type of exercise: bike - sometime; weighs     Times/week: 1-2  Current Problems (verified): 1)  Insomnia  (ICD-780.52) 2)  Chest Pain  (ICD-786.50) 3)  Dyspnea On Exertion  (ICD-786.09) 4)  Palpitations  (ICD-785.1) 5)  Pruritus  (ICD-698.9) 6)  Wheezing  (ICD-786.07) 7)  Screening Examination For Pulmonary Tuberculosis   (ICD-V74.1) 8)  Vitamin D Deficiency  (ICD-268.9) 9)  Myalgia  (ICD-729.1) 10)  Unspecified Hearing Loss  (ICD-389.9) 11)  Tinnitus, Left  (ICD-388.30) 12)  Other Decreased White Blood Cell Count  (ICD-288.59) 13)  Anemia  (ICD-285.9) 14)  Skin Lesion  (ICD-709.9) 15)  Impaired Fasting Glucose  (ICD-790.21) 16)  Screening For Malignant Neoplasm, Colon  (ICD-V76.51) 17)  Blurred Vision  (ICD-368.8) 18)  Mammogram, Abnormal, Hx of  (ICD-V15.9) 19)  Osteoarthritis  (ICD-715.90) 20)  Depression  (ICD-311) 21)  Gerd  (ICD-530.81) 22)  Panic Disorder  (ICD-300.01) 23)  Constipation  (ICD-564.00) 24)  Hypertension  (ICD-401.9) 25)  Hyperlipidemia  (ICD-272.4)  Medications Prior to Update: 1)  Norvasc 10 Mg Tabs (Amlodipine Besylate) .... Take 1 Tablet By Mouth Once A Day 2)  Aspirin 81 Mg Tbec (Aspirin) .... Take 1 Tablet By Mouth Once A Day 3)  Multivitamins  Tabs (Multiple Vitamin) .... Take 1 Tablet By Mouth Once A Day 4)  Simvastatin 40 Mg Tabs (Simvastatin) .... Take 1 Tablet By Mouth Once A Day 5)  Omeprazole 20 Mg  Cpdr (Omeprazole) .Marland Kitchen.. 1 Each Day 20-30 Min Prior To Breakfast Meal 6)  Tylenol Extra Strength 500 Mg Tabs (Acetaminophen) .... Take 2 Tablets By Mouth Three Times A Day As Needed  For Pain 7)  Miralax  Powd (Polyethylene Glycol 3350) .... Mix 17g With Glass of Water and Drink Once Daily. 8)  Hydroxyzine Hcl 25 Mg/ml Soln (Hydroxyzine Hcl) .... One Tablet Every 6 Hours As Needed For Itching 9)  Ammonium Lactate 12 % Lotn (Ammonium Lactate) .... Apply To Skin Three Times A Day As Needed For Itching 10)  Amitiza 8 Mcg  Caps (Lubiprostone) .Marland Kitchen.. 1 Two Times A Day/take With Food and Water  Allergies (verified): 1)  Ibuprofen  Past History:  Past Medical History: Last updated: 10/21/2009 1. Hyperlipidemia 2. Hypertension 3. Panic disorder 4. Cath 2007: LVEF 65%, Right dominant system with patent coronary arteries 5. Constipation 6. GERD 7. Depression 8.  Osteoarthritis  -- R knee meniscal repair 2005 9. Palpitations 10. Anemia 11. Bilateral cataracts. 12. colonic tubular adenoma, August 2008, repeat colonoscopy August 2013  Past Surgical History: Last updated: 01/31/2008 Appendectomy  Family History: Last updated: 10/21/2009 no colon cancer No premature CAD.   Social History: Last updated: 10/21/2009 Nonsmoker, lives in [place], retired Lawyer.  Does not drink alcohol, does not drink caffeinated beverages.  Risk Factors: Smoking Status: never (01/11/2010) Passive Smoke Exposure: yes (01/11/2010)  Social History: Passive Smoke Exposure:  yes  Review of Systems  The patient denies anorexia, fever, weight loss, vision loss, hoarseness, syncope, dyspnea on exertion, peripheral edema, prolonged cough, headaches, abdominal pain, hematochezia, and severe indigestion/heartburn.    Physical Exam  General:  alert, pleasant, well groomed, no distress.  Eyes:  anicteric. Mouth:  MMM, dentures in place. Lungs:  Normal respiratory effort, chest expands symmetrically. Lungs are clear to auscultation, no crackles or wheezes. Heart:  Normal rate and regular rhythm. Abdomen:  +BS's, soft, NT and ND. Msk:  no joint swelling, no joint warmth, and no redness over joints.    Pulses:  2+/4 bilaterally Extremities:  no edema.  Neurologic:  alert & oriented X3, cranial nerves II-XII intact, strength normal in all extremities, gait normal, DTRs symmetrical and normal, and Romberg negative.   Skin:   turgor normal and no rashes.  Cervical Nodes:  no LAD. Psych:  mood euthymic.   Impression & Recommendations:  Problem # 1:  CHEST PAIN (ICD-786.50) Assessment Unchanged MSK in nature. Capsaocom topical cream qhs PRN, Tylenol OTC as needed; low heat to chest wall qid as needed for 15 minutes. Orders: 12 Lead EKG (12 Lead EKG)  Problem # 2:  DEPRESSION (ICD-311) Assessment: Unchanged Denies  SI/HI or mania. will start Zoloft. Her updated  medication list for this problem includes:    Hydroxyzine Hcl 25 Mg/ml Soln (Hydroxyzine hcl) ..... One tablet every 6 hours as needed for itching    Zoloft 50 Mg Tabs (Sertraline hcl) .Marland Kitchen... Take one half of tablet by mouth once a day for 7 days; then one tablet daily  Orders: T-TSH ([id number])  Discussed treatment options, including trial of antidpressant medication. Will refer to behavioral health. Follow-up call in in 24-48 hours and recheck in 2 weeks, sooner as needed. Patient agrees to call if any worsening of symptoms or thoughts of doing harm arise. Verified that the patient has no suicidal ideation at this time.   Problem # 3:  IMPAIRED FASTING GLUCOSE (ICD-790.21) Assessment: Unchanged FMHx of DM. Patient c/o of craving sweets and polydypsia. Will check HgbA1C. Labs Reviewed: Creat: 0.96 (10/04/2009)     Problem # 4:  VITAMIN D DEFICIENCY (ICD-268.9) Assessment: Deteriorated Vit D level is low. Will start on 50,000 Units by mouth weekly. Orders: T- *  Misc. Laboratory test [phone])  Complete Medication List: 1)  Norvasc 10 Mg Tabs (Amlodipine besylate) .... Take 1 tablet by mouth once a day 2)  Aspirin 81 Mg Tbec (Aspirin) .... Take 1 tablet by mouth once a day 3)  Multivitamins Tabs (Multiple vitamin) .... Take 1 tablet by mouth once a day 4)  Simvastatin 40 Mg Tabs (Simvastatin) .... Take 1 tablet by mouth once a day 5)  Omeprazole 20 Mg Cpdr (Omeprazole) .Marland Kitchen.. 1 each day 20-30 min prior to breakfast meal 6)  Tylenol Extra Strength 500 Mg Tabs (Acetaminophen) .... Take 2 tablets by mouth three times a day as needed for pain 7)  Miralax Powd (Polyethylene glycol 3350) .... Mix 17g with glass of water and drink once daily. 8)  Hydroxyzine Hcl 25 Mg/ml Soln (Hydroxyzine hcl) .... One tablet every 6 hours as needed for itching 9)  Ammonium Lactate 12 % Lotn (Ammonium lactate) .... Apply to skin three times a day as needed for itching 10)  Amitiza 8 Mcg Caps (Lubiprostone) .Marland Kitchen.. 1  two times a day/take with food and water 11)  Zostrix Arthritis Pain Relief 0.025 % Crea (Capsaicin) .... Apply thin layer to chest wall before bedtime as needed for pain 12)  Zoloft 50 Mg Tabs (Sertraline hcl) .... Take one half of tablet by mouth once a day for 7 days; then one tablet daily 13)  Vitamin D (ergocalciferol) 50000 Unit Caps (Ergocalciferol) .... Take one tablet by mouth once a week  Other Orders: T-Hgb A1C (in-house) ([id number])  Patient Instructions: 1)  Please, use heating pad _>15 enol on PRNmin to chest wall 4 times daily as need. 2)  Please, use Tylenol as needed . 3)  Call with any questions. Prescriptions: VITAMIN D (ERGOCALCIFEROL) 50000 UNIT CAPS (ERGOCALCIFEROL) Take one tablet by mouth once a week  #4 x 6   Entered and Authorized by:   Deatra Robinson MD   Signed by:   Deatra Robinson MD on 01/13/2010   Method used:   Electronically to        [facility] [address] #[id number]* (retail)       [address] [place], [place]  [address]       Ph: [phone]       Fax: [phone]   RxID:   [id number] ZOLOFT 50 MG TABS (SERTRALINE HCL) Take one half of tablet by mouth once a day for 7 days; then one tablet daily  #30 x 11   Entered and Authorized by:   Deatra Robinson MD   Signed by:   Deatra Robinson MD on 01/11/2010   Method used:   Electronically to        [facility] [address] #[id number]* (retail)       [address] [place], [place]  [address]       Ph: [phone]       Fax: [phone]   RxID:   [id number]   Handout requested. ZOSTRIX ARTHRITIS PAIN RELIEF 0.025 % CREA (CAPSAICIN) Apply thin layer to chest wall before bedtime as needed for pain  #60 gm x 3   Entered and Authorized by:   Deatra Robinson MD   Signed by:   Deatra Robinson MD on 01/11/2010   Method used:   Electronically to        [facility] [address] #[id number]* (retail)       [address]  [place], [place]  [address]       Ph: [phone]       Fax: [phone]    RxID:   [id number]    Orders Added: 1)  T-Hgb A1C (in-house) [16606TK] 2)  T- * Misc. Laboratory test [99999] 3)  12 Lead EKG [12 Lead EKG] 4)  Est. Patient Level IV [99214] 5)  T-TSH [[id number]]    Prevention & Chronic Care Immunizations   Influenza vaccine: Fluvax Non-MCR  (12/28/2006)   Influenza vaccine deferral: Refused  (01/18/2009)   Influenza vaccine due: 10/22/2010    Tetanus booster: Not documented   Td booster deferral: Refused  (01/11/2010)    Pneumococcal vaccine: Not documented   Pneumococcal vaccine deferral: Refused  (01/11/2010)    H. zoster vaccine: Not documented   H. zoster vaccine deferral: Not available  (01/11/2010)  Colorectal Screening   Hemoccult: Negative  (10/20/2003)   Hemoccult action/deferral: Ordered  (10/04/2009)    Colonoscopy: Results: Polyp.  Pt with polyp in transverse colon  (10/03/2006)   Colonoscopy action/deferral: GI referral  (10/15/2009)   Colonoscopy due: 10/03/2011  Other Screening   Pap smear: Not documented   Pap smear action/deferral: Not indicated S/P hysterectomy  (09/09/2008)    Mammogram: ASSESSMENT: Benign - BI-RADS 2^MM DIGITAL SCREENING  (05/07/2009)   Mammogram action/deferral: Refused  (01/11/2010)   Mammogram due: 09/10/2009    DXA bone density scan: Not documented   DXA bone density action/deferral: Refused  (04/14/2009)   Smoking status: never  (01/11/2010)  Lipids   Total Cholesterol: 206  (11/09/2009)   LDL: 159  (04/14/2009)   LDL Direct: 119.5  (11/09/2009)   HDL: 66.50  (11/09/2009)   Triglycerides: 68.0  (11/09/2009)   Lipid panel due: 11/10/2010    SGOT (AST): 22  (11/09/2009)   SGPT (ALT): 18  (11/09/2009)   Alkaline phosphatase: 69  (11/09/2009)   Total bilirubin: 0.5  (11/09/2009)   Liver panel due: 11/10/2010    Lipid flowsheet reviewed?: Yes   Progress toward LDL goal: Improved    Stage of readiness to change (lipid management): Contemplation  Hypertension   Last  Blood Pressure: 122 / 64  (01/11/2010)   Serum creatinine: 0.96  (10/04/2009)   Serum potassium 4.2  (10/04/2009)   Progress toward BP goal: At goal    Stage of readiness to change (hypertension management): Maintenance  Self-Management Support :   Personal Goals (by the next clinic visit) :      Personal blood pressure goal: 140/90  (08/02/2009)     Personal LDL goal: 100  (08/02/2009)    Patient will work on the following items until the next clinic visit to reach self-care goals:     Medications and monitoring: take my medicines every day, bring all of my medications to every visit  (01/11/2010)     Eating: eat more vegetables, use fresh or frozen vegetables, eat foods that are low in salt, eat baked foods instead of fried foods  (01/11/2010)     Activity: take a 30 minute walk every day  (01/11/2010)    Hypertension self-management support: Written self-care plan  (01/11/2010)   Hypertension self-care plan printed.    Lipid self-management support: Written self-care plan  (01/11/2010)   Lipid self-care plan printed.  Process Orders Check Orders Results:     [facility]: ABN not required for this insurance Tests Sent for requisitioning (January 13, 2010 2:56 PM):     01/11/2010: [facility] -- T- * Misc. Laboratory test [phone] (  signed)     01/11/2010: [facility] -- T-TSH [phone] (signed)     Laboratory Results   Blood Tests   Date/Time Received: January 11, 2010 3:10 PM  Date/Time Reported: Burke Keels  January 11, 2010 3:10 PM   HGBA1C: 5.8%   (Normal Range: Non-Diabetic - 3-6%   Control Diabetic - 6-8%)

## 2010-03-22 NOTE — Progress Notes (Signed)
Summary: med contraindication/gp  Phone Note From Pharmacy   Caller: [facility] [address] #[id number]* Summary of Call: Received fax from [facility] stating the daiy dose of Simvastatin should not exceed 20mg  in pts. receiving Amlodipine 10mg . Also plasma concentration and pharmaologic effects of Simvastatin 40mg  may be increased by Amlodipine.  Please advise.  Thanks Initial call taken by: Chinita Pester RN,  December 01, 2009 2:34 PM  Follow-up for Phone Call        Given that her BP and Cholesterol are both very high, its ok to continue at her current dosage. and will monitor. thanks. Follow-up by: Darnelle Maffucci MD,  December 01, 2009 5:09 PM  Additional Follow-up for Phone Call Additional follow up Details #1::        [facility] was called and made awared to continue current dosage per Dr. Gilford Rile. Additional Follow-up by: Chinita Pester RN,  December 02, 2009 8:59 AM

## 2010-03-22 NOTE — Assessment & Plan Note (Signed)
Summary: reflux/gg   Vital Signs:  Patient Profile:   70 Years Old Female Height:     63 inches (160.02 cm) Weight:      177.05 pounds (80.48 kg) BMI:     31.48 Temp:     97.3 degrees F (36.28 degrees C) oral BP sitting:   150 / 72  (right arm)  Pt. in pain?   yes    Location:   rt leg left breast    Intensity:   6/7    Type:       aching  Vitals Entered By: Angelina Ok RN (December 09, 2007 2:50 PM)              Is Patient Diabetic? No Nutritional Status BMI of > 30 = obese  Have you ever been in a relationship where you felt threatened, hurt or afraid?No   Does patient need assistance? Functional Status Self care     Chief Complaint:  Pain in both knees and head at night.  History of Present Illness: Pt is a 70 year old woman with PMH significant for HTN, GERD, constipation, HLD, anxiety, and depression who presents to clinic with worsening of her GERD.  She reports that she is having increased epigastric pain but denies warning signs like weight loss, dysphagia, bleeding, etc...  She has been taking her protonix but her insurance company will not give two times a day dosing.  She is also complaining of some MSK pain in her chest.  Pt also reports that she has not been taking her norvasc.   Pt also complains of having some right thigh myalgias.  No other complaints.     Prior Medications Reviewed Using: Medication Bottles  Prior Medication List:  NORVASC 10 MG TABS (AMLODIPINE BESYLATE) Take 1 tablet by mouth once a day ASPIRIN 81 MG TBEC (ASPIRIN) Take 1 tablet by mouth once a day COLACE 100 MG CAPS (DOCUSATE SODIUM) Take 1 capsule by mouth two times a day LISINOPRIL 40 MG TABS (LISINOPRIL) Take 1 tablet by mouth once a day DULCOLAX 5 MG TBEC (BISACODYL) Take 1 tablet by mouth two times a day as needed for constipation METAMUCIL 30.9 % POWD (PSYLLIUM) Take 1 teaspoon mixed in 8 oz of water once daily PRAVACHOL 40 MG TABS (PRAVASTATIN SODIUM) 2 tabs by mouth  Qday PROTONIX 40 MG  TBEC (PANTOPRAZOLE SODIUM) Take 1 tablet by mouth once a day FLEXERIL 5 MG  TABS (CYCLOBENZAPRINE HCL) take 1 pill by mouth three times a day for 3 wks.   Current Allergies (reviewed today): No known allergies     Risk Factors: Tobacco use:  never Alcohol use:  no Exercise:  yes    Times per week:  3    Type:  bike - sometime; weighs Seatbelt use:  100 %  Mammogram History:    Date of Last Mammogram:  09/11/2007   Review of Systems  The patient denies anorexia, fever, weight loss, weight gain, hoarseness, syncope, dyspnea on exertion, peripheral edema, prolonged cough, hemoptysis, melena, hematochezia, hematuria, muscle weakness, suspicious skin lesions, depression, and abnormal bleeding.     Physical Exam  General:     alert, well-developed, well-nourished, well-hydrated, and overweight-appearing.   Head:     normocephalic and atraumatic.   Neck:     supple.   Lungs:     normal respiratory effort, no intercostal retractions, no accessory muscle use, normal breath sounds, no crackles, and no wheezes.   Heart:     normal  rate, regular rhythm, no murmur, and no gallop.   Abdomen:     soft, non-tender, normal bowel sounds, no distention, no masses, and no guarding.   Msk:     normal ROM.  Pt has bilateral knee tenderness.  Neurologic:     alert & oriented X3.   Skin:     color normal, no rashes, no suspicious lesions, and no edema.   Psych:     Oriented X3, normally interactive, good eye contact, not anxious appearing, and not depressed appearing.      Impression & Recommendations:  Problem # 1:  GERD (ICD-530.81) I have asked pt to continue her protonix daily and will have her take prilosec at night since her insurance company will not pay for two times a day dosing of the protonix.  Pt also reports that she had a normal EGD done about two years ago and she is not having any red flags to warrant a repeat examination at this time.  I have also  printed out a GERD handout for her to go over as well.   Her updated medication list for this problem includes:    Protonix 40 Mg Tbec (Pantoprazole sodium) .Marland Kitchen... Take 1 tablet by mouth once a day   Problem # 2:  MUSCLE PAIN (ICD-729.1) Pt with MSK pain in her chest and in her right thigh.  Given that she is on a high dose statin I will check a CK level today as well as a CMET.  If elevated will need to back off of the pravachol.  Will also prescribed her some flexeril.   Her updated medication list for this problem includes:    Aspirin 81 Mg Tbec (Aspirin) .Marland Kitchen... Take 1 tablet by mouth once a day    Flexeril 5 Mg Tabs (Cyclobenzaprine hcl) .Marland Kitchen... Take 1 pill by mouth three times a day for 3 wks.  Orders: T-Sed Rate (Automated) [id number]) T-CK Total [id number])   Problem # 3:  DEPRESSION (ICD-311) Stable.  No medications.   Problem # 4:  HYPERTENSION (ICD-401.9) Pt has not been taking her norvasc for quite some time because she was already taking the lisinopril.  I have asked pt to restart the norvasc.   Her updated medication list for this problem includes:    Norvasc 10 Mg Tabs (Amlodipine besylate) .Marland Kitchen... Take 1 tablet by mouth once a day    Lisinopril 40 Mg Tabs (Lisinopril) .Marland Kitchen... Take 1 tablet by mouth once a day   Complete Medication List: 1)  Norvasc 10 Mg Tabs (Amlodipine besylate) .... Take 1 tablet by mouth once a day 2)  Aspirin 81 Mg Tbec (Aspirin) .... Take 1 tablet by mouth once a day 3)  Colace 100 Mg Caps (Docusate sodium) .... Take 1 capsule by mouth two times a day 4)  Lisinopril 40 Mg Tabs (Lisinopril) .... Take 1 tablet by mouth once a day 5)  Dulcolax 5 Mg Tbec (Bisacodyl) .... Take 1 tablet by mouth two times a day as needed for constipation 6)  Metamucil 30.9 % Powd (Psyllium) .... Take 1 teaspoon mixed in 8 oz of water once daily 7)  Pravachol 40 Mg Tabs (Pravastatin sodium) .... 2 tabs by mouth qday 8)  Protonix 40 Mg Tbec (Pantoprazole sodium) .... Take 1  tablet by mouth once a day 9)  Flexeril 5 Mg Tabs (Cyclobenzaprine hcl) .... Take 1 pill by mouth three times a day for 3 wks.  Other Orders: T-Comprehensive Metabolic Panel [id number]) T-TSH [id number])  Patient Instructions: 1)  Please schedule a follow-up appointment in 3 months. 2)  You may take one of your prilosec pills every night. 3)  Please start taking your amlodipine (norvasc pills).  Your blood pressure is a little high today.   4)  I am going to give you some more muscle relaxer for you to use.  Please read the GERD handout as well.   5)  I will call you with any abnormal labs.    Prescriptions: NORVASC 10 MG TABS (AMLODIPINE BESYLATE) Take 1 tablet by mouth once a day  #31 x 10   Entered and Authorized by:   Rufina Falco MD   Signed by:   Rufina Falco MD on 12/09/2007   Method used:   Electronically to        [facility] [address] [phone]* (retail)       [address]       [place], [place]  [address]       Ph: [phone]       Fax: [phone]   RxID:   [id number] FLEXERIL 5 MG  TABS (CYCLOBENZAPRINE HCL) take 1 pill by mouth three times a day for 3 wks.  #63 x 0   Entered and Authorized by:   Rufina Falco MD   Signed by:   Rufina Falco MD on 12/09/2007   Method used:   Electronically to        [facility] [address] [phone]* (retail)       [address]       [place], [place]  [address]       Ph: [phone]       Fax: [phone]   RxID:   [id number] FLEXERIL 5 MG  TABS (CYCLOBENZAPRINE HCL) take 1 pill by mouth three times a day for 3 wks.  #63 x 0   Entered and Authorized by:   Rufina Falco MD   Signed by:   Rufina Falco MD on 12/09/2007   Method used:   Electronically to        [facility] [address] (retail)       [phone]       [place], [place]  [address]       Ph: [phone]       Fax: [phone]   RxID:    [id number] NORVASC 10 MG TABS (AMLODIPINE BESYLATE) Take 1 tablet by mouth once a day  #31 x 10   Entered and Authorized by:   Rufina Falco MD   Signed by:   Rufina Falco MD on 12/09/2007   Method used:   Electronically to        [facility] [address] (retail)       [phone]       [place], [place]  [address]       Ph: [phone]       Fax: [phone]   RxID:   [id number]  ]

## 2010-03-22 NOTE — Progress Notes (Signed)
Summary: med compliance/ hla  Phone Note Call from Patient   Summary of Call: pt called in response to your call, she states she takes hers meds daily, does not ever go w/out them. appt? new script? Initial call taken by: Marin Roberts RN,  April 28, 2009 10:51 AM  Follow-up for Phone Call        I assume that the call was from [place] as I do not remember calling this patient, maybe a f/u would be a good idea to recheck her LFT/FLP since they were checked last 11/2008. and to maybe adjust her med regiment if she is compliant and her current regiment and if it is not fully effective in controlling her medical problems.  Follow-up by: Darnelle Maffucci MD,  April 28, 2009 7:01 PM  Additional Follow-up for Phone Call Additional follow up Details #1::        I spoke w/ pt and she had not increased her pravastatin to 2 pills a day but did once we went over it at her office visit.  Will plan on repeating lipids after several mos after the higher dose has had time to work.   Additional Follow-up by: Joaquin Courts  MD,  May 03, 2009 9:36 AM

## 2010-03-22 NOTE — Procedures (Signed)
Summary: EGD   EGD  Procedure date:  04/01/2008  Findings:      Location: [facility]    ENDOSCOPY PROCEDURE REPORT  PATIENT:  Brianna, Pittman  MR#:  [id number] BIRTHDATE:   [birth year]   GENDER:   female  ENDOSCOPIST:   Rachael Fee, MD Referred by: Rufina Falco, M.D.  PROCEDURE DATE:  04/01/2008 PROCEDURE:  EGD, diagnostic ASA CLASS:   Class II INDICATIONS: abdominal pain (epigastric)  MEDICATIONS:   Fentanyl 50 mcg IV, Versed 5 mg IV TOPICAL ANESTHETIC:   Exactacain Spray  DESCRIPTION OF PROCEDURE:   After the risks benefits and alternatives of the procedure were thoroughly explained, informed consent was obtained.  The LB [id number] endoscope was introduced through the mouth and advanced to the second portion of the duodenum, without limitations.  The instrument was slowly withdrawn as the mucosa was fully examined. <<PROCEDUREIMAGES>>          <<OLD IMAGES>>  The upper, middle, and distal third of the esophagus were carefully inspected and no abnormalities were noted. The z-line was well seen at the GEJ. The endoscope was pushed into the fundus which was normal including a retroflexed view. The antrum,gastric body, first and second part of the duodenum were unremarkable (see image1, image2, image3, image4, image5, and image6).    Retroflexed views revealed no abnormalities.    The scope was then withdrawn from the patient and the procedure completed.  COMPLICATIONS:   None  ENDOSCOPIC IMPRESSION:  1) Normal EGD  RECOMMENDATIONS:  She has not changed the way she is taking her PPI as I recommended during office visit in December.  I think this could make a difference with her vague epigastric discomfort, dyspepsia.  I therefore recommended again that she take her PPI (omeprazole) 20-30 min prior to breakfast meal daily.  This is the way the pill is designed to work most effectively.   _______________________________ Rachael Fee, MD  CC:  Rufina Falco, MD      Appended Document: EGD    Clinical Lists Changes  Observations: Added new observation of EGD: Findings: Normal   (04/01/2008 8:37)       EGD  Procedure date:  04/01/2008  Findings:      Findings: Normal

## 2010-03-22 NOTE — Assessment & Plan Note (Signed)
Summary: ACUTE-STOMACH PAIN/CFB(YOUNG)/CFB   Vital Signs:  Patient Profile:   70 Years Old Female Height:     63 inches (160.02 cm) Weight:      173.5 pounds (78.86 kg) BMI:     30.85 Temp:     98.5 degrees F (36.94 degrees C) oral Pulse rate:   69 / minute BP sitting:   119 / 70  (right arm)  Pt. in pain?   yes    Location:   reflux    Intensity:   8  Vitals Entered By: Stanton Kidney Ditzler RN (July 27, 2006 3:00 PM)              Is Patient Diabetic? No Nutritional Status BMI of > 30 = obese Nutritional Status Detail goodd  Have you ever been in a relationship where you felt threatened, hurt or afraid?enies   Does patient need assistance? Functional Status Self care Ambulation Normal   Chief Complaint:  FU on BP and reflux - took 2 Norvasc toad.Marland Kitchen  History of Present Illness: 70 y/o F who presents today for routine f/u of her BP and GERD  She is concerned with her BP as her homemonitor has recorded some value as high as 180/100 recently.  the home monitor is a wrist cuff style. also she states that she thinks her BP is better today because she took 2 of the 10mg  norvasc pills today since she thought her BP was high (I instructed her that this was not recommended) no CP/SOB recently  she is also c/o of continues GERD and acid reflux.  on the last visit she was changed to protonix which she has been taking, but she does not think it has made much of a change from the prilosec she was taking previously.  On further investigation, it is apparrent that she is taking the medicine AFTER her breakfast.    Current Allergies (reviewed today): No known allergies     Risk Factors: Tobacco use:  never Alcohol use:  no Exercise:  yes    Times per week:  3    Type:  bike Seatbelt use:  100 %   Review of Systems      See HPI       The patient complains of severe indigestion/heartburn.  The patient denies anorexia, fever, hoarseness, chest pain, syncope, peripheral edema,  abdominal pain, incontinence, and difficulty walking.    General      Denies chills, fatigue, fever, loss of appetite, malaise, sleep disorder, sweats, weakness, and weight loss.  CV      Denies chest pain or discomfort, fainting, fatigue, palpitations, and shortness of breath with exertion.  Resp      Denies shortness of breath, sputum productive, and wheezing.  GI      Denies constipation, diarrhea, indigestion, nausea, vomiting, and vomiting blood.  GU      Denies dysuria.  MS      Denies joint pain.   Physical Exam  General:     Well-developed,well-nourished,in no acute distress; alert,appropriate and cooperative throughout examination Head:     normocephalic and atraumatic.   Eyes:     vision grossly intact.   Ears:     no external deformities.   Nose:     no external deformity.   Mouth:     fair dentition.   Neck:     supple.   Lungs:     Normal respiratory effort, chest expands symmetrically. Lungs are clear to auscultation, no crackles  or wheezes. Heart:     Normal rate and regular rhythm. S1 and S2 normal without gallop, murmur, click, rub or other extra sounds. Abdomen:     soft and non-tender.   Msk:     normal ROM.      Impression & Recommendations:  Problem # 1:  GERD (ICD-530.81) pt continues to complain of GERD Sx. she actually says that when she took the brand nameprotonix, she had complete relief of her reflux. however, since she is on the 4$ [facility] generic, she has not been getting great relief.  1) She needs to take the protonix before meals and not after as she is taking now. This willblock the acid secretion before meals   2) I will increase her to two times a day dosing as well since I believe that the generic version is much weaker than the brand name. If she has sig relief on f/u appt, then we can change her back to before meals once daily dosing to see if that will remain effective as well.   Her updated medication list for this  problem includes:    Protonix 40 Mg Tbec (Pantoprazole sodium) .Marland Kitchen... Take 1 tablet by mouth two times a day  Orders: T-Hgb A1C (in-house) ([id number])   Problem # 2:  HYPERTENSION (ICD-401.9) BP today is improved from the last visit, however the pt states she took 2 of the norvasc today (reminded her that this is not recommended) Nonetheless, her BP is under good control and my fear would be that if i added another med to her list then i could perhaps lower her BP to an unsafe level.    her home cuff did record some high measurements, although I am not sure how these were taken and how reliable this meter is.  no changes to her BP meds at this time    Her updated medication list for this problem includes:    Norvasc 10 Mg Tabs (Amlodipine besylate) .Marland Kitchen... Take 1 tablet by mouth once a day    Lisinopril 40 Mg Tabs (Lisinopril) .Marland Kitchen... 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  Orders: T-Hgb A1C (in-house) ([id number])   Medications Added to Medication List This Visit: 1)  Pravachol 40 Mg Tabs (Pravastatin sodium) .... 2 tabs by mouth qday 2)  Protonix 40 Mg Tbec (Pantoprazole sodium) .... Take 1 tablet by mouth two times a day  Other Orders: Gastroenterology Referral (GI)  Future Orders: T-Lipid Profile ([id number]) ... 09/06/2006   Patient Instructions: 1)  Please schedule a follow-up appointment in 1 -2 months. 2)  continue to take your blood pressure medicines as regular 3)  increase the protonix to two times a day dosing, and take at least 30 min before eating.  you can finish the  current bottle, and then use the new prescription 4)  we will call you to schedule a colonscopy  Appended Document: lab results - a1c    Lab Visit  Laboratory Results   Blood Tests   Date/Time Recieved: July 27, 2006 3:42 PM  Date/Time Reported: ..................................................................Marland KitchenAlric Quan  July 27, 2006 3:42 PM   HGBA1C: 6.1%   (Normal Range: Non-Diabetic - 3-6%   Control Diabetic - 6-8%)     Orders Today:

## 2010-03-22 NOTE — Progress Notes (Signed)
Summary: refill/gg     Rubyann Lingle  Phone Note Refill Request  on December 13, 2006 2:02 PM  Refills Requested: Medication #1:  PRAVACHOL 40 MG TABS 2 tabs by mouth Qday   Last Refilled: 11/12/2006  Method Requested: electronic Initial call taken by: Merrie Roof RN,  December 13, 2006 2:03 PM  Follow-up for Phone Call        Prescription electronically sent to pharmacy.      Prescriptions: PRAVACHOL 40 MG TABS (PRAVASTATIN SODIUM) 2 tabs by mouth Qday  #60 x 5   Entered and Authorized by:   Rufina Falco MD   Signed by:   Rufina Falco MD on 12/13/2006   Method used:   Electronically sent to ...       [facility]       [address]       [place], [place]  [address]       Ph: [phone]       Fax: [phone]   RxID:   [id number]

## 2010-03-22 NOTE — Assessment & Plan Note (Signed)
Summary: EST.CK/FU/MEDS/CFB   Vital Signs:  Patient Profile:   70 Years Old Female Height:     63 inches (160.02 cm) Weight:      173.5 pounds (78.86 kg) BMI:     30.85 Temp:     97.7 degrees F (36.50 degrees C) oral Pulse rate:   53 / minute BP sitting:   133 / 70  (right arm) Cuff size:   large    Location:   head  Vitals Entered ByMarland Kitchen Theotis Barrio (April 08, 2007 3:27 PM)             Is Patient Diabetic? No Nutritional Status NORMAL  Have you ever been in a relationship where you felt threatened, hurt or afraid?No   Does patient need assistance? Functional Status Self care Ambulation Normal     Chief Complaint:  RIGHT LEG PAIN - OFF/ ON AT TIMES / HEAD ACHE COMES AND GOES / MEDICATION REFILL.  History of Present Illness: Pt is a 70 year old woman with PMH significant for HTN, HLD, anxiety, depression, and GERD presenting to clinic for check up.  Today she is complaining of a small lesion on her right thumb.  She denies pain, erythema and reports the lesion has not grown in the last couple of months.  Pt also complains of right knee pain that is alleviated with her alleve.  Pt also would like to have the prescriptions for three months at a time.      Updated Prior Medication List: NORVASC 10 MG TABS (AMLODIPINE BESYLATE) Take 1 tablet by mouth once a day ASPIRIN 81 MG TBEC (ASPIRIN) Take 1 tablet by mouth once a day COLACE 100 MG CAPS (DOCUSATE SODIUM) Take 1 capsule by mouth two times a day EFFEXOR 37.5 MG TABS (VENLAFAXINE HCL) Take 1 tablet by mouth once a day LISINOPRIL 40 MG TABS (LISINOPRIL) Take 1 tablet by mouth once a day DULCOLAX 5 MG TBEC (BISACODYL) Take 1 tablet by mouth two times a day as needed for constipation METAMUCIL 30.9 % POWD (PSYLLIUM) Take 1 teaspoon mixed in 8 oz of water once daily PRAVACHOL 40 MG TABS (PRAVASTATIN SODIUM) 2 tabs by mouth Qday PANTOPRAZOLE SODIUM 40 MG  TBEC (PANTOPRAZOLE SODIUM) Take 1 tablet by mouth two times a  day  Current Allergies (reviewed today): No known allergies   Past Medical History:    Hyperlipidemia    Hypertension    Panic disorder    Unstable angina    Constipation    GERD    Depression    Osteoarthritis    palpitations    Hx of dizziness    blurred vision    right thumb skin lesion:  ? ganglioma    Risk Factors: Tobacco use:  never Alcohol use:  no Exercise:  yes    Times per week:  3    Type:  bike Seatbelt use:  100 %   Review of Systems       See HPI   Physical Exam  General:     alert, well-developed, well-nourished, and well-hydrated.   Head:     normocephalic and atraumatic.   Neck:     supple.   Lungs:     normal respiratory effort, no intercostal retractions, no accessory muscle use, normal breath sounds, no crackles, and no wheezes.   Heart:     normal rate, regular rhythm, no murmur, no gallop, and no rub.   Abdomen:     soft, non-tender, and normal bowel sounds.  Msk:     No deformity or scoliosis noted of thoracic or lumbar spine.   Extremities:     No clubbing, cyanosis, edema, or deformity noted with normal full range of motion of all joints.   Neurologic:     alert & oriented X3.   Skin:     turgor normal, color normal, no rashes, and no suspicious lesions.  Pt has what appears to be a ganglioma on her right thumb.  Psych:     Oriented X3, not depressed appearing, and slightly anxious.      Impression & Recommendations:  Problem # 1:  SKIN LESION (ICD-709.9) Assessment: Comment Only Pt looks like she has a ganglioma on her right thumb.  There is no erythema, pain, or signs of infection. Upon discussion with pt we have decided to watch it for now.  Pt notified to let me know if it becomes bothersome or looks infected.  Problem # 2:  IMPAIRED FASTING GLUCOSE (ICD-790.21) Pt counselled on diet and exercise.   Problem # 3:  HYPERTENSION (ICD-401.9) Good control.  Continue current treatment.  Her updated medication list for  this problem includes:    Norvasc 10 Mg Tabs (Amlodipine besylate) .Marland Kitchen... Take 1 tablet by mouth once a day    Lisinopril 40 Mg Tabs (Lisinopril) .Marland Kitchen... Take 1 tablet by mouth once a day   Problem # 4:  HYPERLIPIDEMIA (ICD-272.4) Continue current meds.  Her updated medication list for this problem includes:    Pravachol 40 Mg Tabs (Pravastatin sodium) .Marland Kitchen... 2 tabs by mouth qday   Problem # 5:  MAMMOGRAM, ABNORMAL, HX OF (ICD-V15.9) Pt again encouraged to get a mammogram but she declined.   Problem # 6:  OSTEOARTHRITIS (ICD-715.90) Pt will continue to use Alleve for her right knee pain.  She was told to let me know if alleve stopped working.  Her updated medication list for this problem includes:    Aspirin 81 Mg Tbec (Aspirin) .Marland Kitchen... Take 1 tablet by mouth once a day   Complete Medication List: 1)  Norvasc 10 Mg Tabs (Amlodipine besylate) .... Take 1 tablet by mouth once a day 2)  Aspirin 81 Mg Tbec (Aspirin) .... Take 1 tablet by mouth once a day 3)  Colace 100 Mg Caps (Docusate sodium) .... Take 1 capsule by mouth two times a day 4)  Effexor 37.5 Mg Tabs (Venlafaxine hcl) .... Take 1 tablet by mouth once a day 5)  Lisinopril 40 Mg Tabs (Lisinopril) .... Take 1 tablet by mouth once a day 6)  Dulcolax 5 Mg Tbec (Bisacodyl) .... Take 1 tablet by mouth two times a day as needed for constipation 7)  Metamucil 30.9 % Powd (Psyllium) .... Take 1 teaspoon mixed in 8 oz of water once daily 8)  Pravachol 40 Mg Tabs (Pravastatin sodium) .... 2 tabs by mouth qday 9)  Pantoprazole Sodium 40 Mg Tbec (Pantoprazole sodium) .... Take 1 tablet by mouth two times a day   Patient Instructions: 1)  Please schedule a follow-up appointment in 3 months. 2)  You can take some Alleve for your knee pain.  If it stops working let me know.  3)  We will keep an eye on the bump on your thumb.  Let me know if it becomes painful, red, and larger.   4)  I have sent your prescriptions to the [facility] on [address].      Prescriptions: PRAVACHOL 40 MG TABS (PRAVASTATIN SODIUM) 2 tabs by mouth Qday  #  180 x 1   Entered and Authorized by:   Rufina Falco MD   Signed by:   Rufina Falco MD on 04/08/2007   Method used:   Electronically sent to ...       [facility] #[id number]*       [address]       [place], [place]  [address]       Ph: [phone]       Fax: [phone]   RxID:   [id number] PANTOPRAZOLE SODIUM 40 MG  TBEC (PANTOPRAZOLE SODIUM) Take 1 tablet by mouth two times a day  #180 x 1   Entered and Authorized by:   Rufina Falco MD   Signed by:   Rufina Falco MD on 04/08/2007   Method used:   Electronically sent to ...       [facility] #[id number]*       [address]       [place], [place]  [address]       Ph: [phone]       Fax: [phone]   RxID:   [id number] LISINOPRIL 40 MG TABS (LISINOPRIL) Take 1 tablet by mouth once a day  #90 x 1   Entered and Authorized by:   Rufina Falco MD   Signed by:   Rufina Falco MD on 04/08/2007   Method used:   Electronically sent to ...       [facility] #[id number]*       [address]       [place], [place]  [address]       Ph: [phone]       Fax: [phone]   RxID:   [id number] NORVASC 10 MG TABS (AMLODIPINE BESYLATE) Take 1 tablet by mouth once a day  #90 x 1   Entered and Authorized by:   Rufina Falco MD   Signed by:   Rufina Falco MD on 04/08/2007   Method used:   Electronically sent to ...       [facility] #[id number]*       [address]       [place], [place]  [address]       Ph: [phone]       Fax: [phone]   RxID:   [id number]  ]

## 2010-03-24 NOTE — Assessment & Plan Note (Signed)
Summary: ACUTE-LEG PAIN-/CFB(TOBBIA)   Vital Signs:  Patient profile:   70 year old female Height:      63 inches (160.02 cm) Weight:      181.9 pounds (82.68 kg) BMI:     32.34 Temp:     97.0 degrees F (36.11 degrees C) oral Pulse rate:   69 / minute BP sitting:   168 / 71  (left arm) Cuff size:   regular  Vitals Entered By: Cynda Familia Duncan Dull) (March 11, 2010 10:24 AM) CC: pain c/o feet/leg pain that she feels is 2/2 her norvasc and simvastatin, which she stopped taking a week ago Is Patient Diabetic? No Pain Assessment Patient in pain? no      Nutritional Status BMI of > 30 = obese  Have you ever been in a relationship where you felt threatened, hurt or afraid?No   Does patient need assistance? Functional Status Self care Ambulation Normal    Primary Care Provider:  Darnelle Maffucci, MD  CC:  pain c/o feet/leg pain that she feels is 2/2 her norvasc and simvastatin and which she stopped taking a week ago.  History of Present Illness: 69yo presents complaining of pain and stiffness in her R knee and hip. She has a long history of R knee pain and osteoarthritis. The hip pain is newer and has been bothering her more over the past few weeks or months. She says that she was recently told by her pharmacy to stop taking simvastatin and amlodipine and she wants to know if these meds might be contributing to her pain. For this pain, she takes an OTC pain medicine that contains aspirin, acetaminophen, and caffeine. She has tried ibuprofen and Aleve but says that they upset her stomach. She is also concerned about her bone health and would like referral for a bone density scan.   Depression History:      The patient denies a depressed mood most of the day and a diminished interest in her usual daily activities.        Preventive Screening-Counseling & Management  Alcohol-Tobacco     Alcohol drinks/day: 0     Smoking Status: never     Passive Smoke Exposure: yes  Current  Medications (verified): 1)  Amlodipine Besylate 5 Mg Tabs (Amlodipine Besylate) .... Take 1 Tablet By Mouth Once A Day 2)  Aspirin 81 Mg Tbec (Aspirin) .... Take 1 Tablet By Mouth Once A Day 3)  Simvastatin 40 Mg Tabs (Simvastatin) .... Take 1 Tablet By Mouth Once A Day 4)  Omeprazole 20 Mg  Cpdr (Omeprazole) .Marland Kitchen.. 1 Each Day 20-30 Min Prior To Breakfast Meal 5)  Celebrex 200 Mg Caps (Celecoxib) .... Take 1 Tablet By Mouth Once A Day  Allergies (verified): 1)  Ibuprofen  Past History:  Past Medical History: Last updated: 10/21/2009 1. Hyperlipidemia 2. Hypertension 3. Panic disorder 4. Cath 2007: LVEF 65%, Right dominant system with patent coronary arteries 5. Constipation 6. GERD 7. Depression 8. Osteoarthritis  -- R knee meniscal repair 2005 9. Palpitations 10. Anemia 11. Bilateral cataracts. 12. colonic tubular adenoma, August 2008, repeat colonoscopy August 2013  Past Surgical History: Last updated: 01/31/2008 Appendectomy  Family History: Last updated: 10/21/2009 no colon cancer No premature CAD.   Social History: Last updated: 10/21/2009 Nonsmoker, lives in [place], retired Lawyer.  Does not drink alcohol, does not drink caffeinated beverages.  Review of Systems      See HPI General:  Denies chills, fever, and malaise. CV:  Denies chest pain  or discomfort. Resp:  Denies cough and shortness of breath. GI:  Denies abdominal pain and change in bowel habits. MS:  Complains of joint pain, joint swelling, and stiffness; denies joint redness. Neuro:  Denies numbness and tingling.  Physical Exam  General:  alert and cooperative to examination.   Head:  normocephalic and atraumatic.   Mouth:  pharynx pink and moist.   Neck:  supple and no masses.   Lungs:  normal breath sounds, no crackles, and no wheezes.   Heart:  normal rate, regular rhythm, no murmur, no gallop, and no rub.   Abdomen:  soft and non-tender.   Msk:  Tenderness to palpation of R knee. Slight  swelling of R knee. Negative straight leg raise bilaterally. Some pain with hip flexion on R.  Pulses:  2+ peripheral pulses Extremities:  No edema.  Neurologic:  alert & oriented X3, cranial nerves grossly intact, strength normal in all extremities, and sensation intact to light touch.   Skin:  turgor normal and no rashes.   Psych:  Oriented X3, memory intact for recent and remote, normally interactive, good eye contact, not anxious appearing, and not depressed appearing.     Impression & Recommendations:  Problem # 1:  OSTEOARTHRITIS (ICD-715.90) Assured patient that pain in knee and hip consistent with osteoarthritis rather than medication side effects. She should continue to take statin and antihypertensives. I warned her against her current OTC pain reliever because it contains additional aspirin (she is already taking a baby ASA). Since she has stomach problems when taking NSAIDS, will try Celebrex. Patient is concerned about being able to afford it. She is advised to see how much prescription will cost her; if it is more than she can afford, she is instructed to call the clinic and we will write for something else (possibly Mobic).   The following medications were removed from the medication list:    Tylenol Extra Strength 500 Mg Tabs (Acetaminophen) .Marland Kitchen... Take 2 tablets by mouth three times a day as needed for pain Her updated medication list for this problem includes:    Aspirin 81 Mg Tbec (Aspirin) .Marland Kitchen... Take 1 tablet by mouth once a day    Celebrex 200 Mg Caps (Celecoxib) .Marland Kitchen... Take 1 tablet by mouth once a day  Problem # 2:  Preventive Health Care (ICD-V70.0) Will refer patient for DEXA scan. Age and hx of VIt D deficiency put her at risk for low bone density.   Problem # 3:  HYPERLIPIDEMIA (ICD-272.4) Advised patient to restart simvastatin as I do not believe she has any evidence of myalgia. Will, however, reduce dose to 20mg  daily because patient is on amlodipine.   Her updated  medication list for this problem includes:    Simvastatin 20 Mg Tabs (Simvastatin) .Marland Kitchen... Take 1 tablet by mouth once a day  Problem # 4:  HYPERTENSION (ICD-401.9) Blood pressure elevated today 2/2 patient not taking amlodipine. Patient advised to restart but will decrease dose to 5mg . BP should be evaluated at follow-up.   Her updated medication list for this problem includes:    Amlodipine Besylate 5 Mg Tabs (Amlodipine besylate) .Marland Kitchen... Take 1 tablet by mouth once a day  Complete Medication List: 1)  Amlodipine Besylate 5 Mg Tabs (Amlodipine besylate) .... Take 1 tablet by mouth once a day 2)  Aspirin 81 Mg Tbec (Aspirin) .... Take 1 tablet by mouth once a day 3)  Simvastatin 20 Mg Tabs (Simvastatin) .... Take 1 tablet by mouth once a day  4)  Omeprazole 20 Mg Cpdr (Omeprazole) .Marland Kitchen.. 1 each day 20-30 min prior to breakfast meal 5)  Celebrex 200 Mg Caps (Celecoxib) .... Take 1 tablet by mouth once a day  Other Orders: Dexa scan (Dexa scan)   Patient Instructions: 1)  Please make an appointment to follow-up in 2 months.  2)  I have sent a prescription for an arthritis medication (Celebrex) to your pharmacy. If this medication is too expensive, please call the clinic and I will prescribe something different. 3)  I have reduced your blood pressure prescription from amlodipine 10mg  to 5mg . Take one tablet daily.  4)  I have reduced your cholesterol medication (simvastatin) to 20mg  daily.  Prescriptions: SIMVASTATIN 20 MG TABS (SIMVASTATIN) Take 1 tablet by mouth once a day  #30 x 6   Entered and Authorized by:   Whitney Post MD   Signed by:   Whitney Post MD on 03/14/2010   Method used:   Electronically to        [id number]* (retail)       [address] [place], [place]  [address]       Ph: [phone]       Fax: [phone]   RxID:   [id number] SIMVASTATIN 40 MG TABS (SIMVASTATIN) Take 1 tablet by mouth once a day  #30 x 3   Entered and Authorized by:    Whitney Post MD   Signed by:   Whitney Post MD on 03/11/2010   Method used:   Electronically to        [id number]* (retail)       [address] [place], [place]  [address]       Ph: [phone]       Fax: [phone]   RxID:   [id number] AMLODIPINE BESYLATE 5 MG TABS (AMLODIPINE BESYLATE) Take 1 tablet by mouth once a day  #90 x 3   Entered and Authorized by:   Whitney Post MD   Signed by:   Whitney Post MD on 03/11/2010   Method used:   Electronically to        [facility] #[id number]* (retail)       [address] [place], [place]  [address]       Ph: [phone]       Fax: [phone]   RxID:   [id number] CELEBREX 200 MG CAPS (CELECOXIB) Take 1 tablet by mouth once a day  #30 x 5   Entered and Authorized by:   Whitney Post MD   Signed by:   Whitney Post MD on 03/11/2010   Method used:   Electronically to        [id number]* (retail)       [address] [place], [place]  [address]       Ph: [phone]       Fax: [phone]   RxID:   [id number]    Orders Added: 1)  Dexa scan [Dexa scan] 2)  Est. Patient Level IV [53976]     Prevention & Chronic Care Immunizations   Influenza vaccine: Fluvax Non-MCR  (12/28/2006)   Influenza vaccine deferral: Refused  (01/18/2009)   Influenza vaccine due: 10/22/2010    Tetanus booster: Not documented   Td booster deferral: Refused  (01/11/2010)    Pneumococcal vaccine:  Not documented   Pneumococcal vaccine deferral: Refused  (01/11/2010)    H. zoster vaccine: Not documented   H. zoster vaccine deferral: Not available  (01/11/2010)  Colorectal Screening   Hemoccult: Negative  (10/20/2003)   Hemoccult action/deferral: Ordered  (10/04/2009)    Colonoscopy: Results: Polyp.  Pt with polyp in transverse colon  (10/03/2006)   Colonoscopy action/deferral: GI referral  (10/15/2009)   Colonoscopy due: 10/03/2011  Other Screening   Pap smear: Not  documented   Pap smear action/deferral: Not indicated S/P hysterectomy  (09/09/2008)    Mammogram: ASSESSMENT: Benign - BI-RADS 2^MM DIGITAL SCREENING  (05/07/2009)   Mammogram action/deferral: Refused  (01/11/2010)   Mammogram due: 09/10/2009    DXA bone density scan: Not documented   DXA bone density action/deferral: Ordered  (03/11/2010)   Smoking status: never  (03/11/2010)  Lipids   Total Cholesterol: 206  (11/09/2009)   LDL: 159  (04/14/2009)   LDL Direct: 119.5  (11/09/2009)   HDL: 66.50  (11/09/2009)   Triglycerides: 68.0  (11/09/2009)   Lipid panel due: 11/10/2010    SGOT (AST): 22  (11/09/2009)   SGPT (ALT): 18  (11/09/2009)   Alkaline phosphatase: 69  (11/09/2009)   Total bilirubin: 0.5  (11/09/2009)   Liver panel due: 11/10/2010    Lipid flowsheet reviewed?: Yes   Progress toward LDL goal: Unchanged  Hypertension   Last Blood Pressure: 168 / 71  (03/11/2010)   Serum creatinine: 0.96  (10/04/2009)   Serum potassium 4.2  (10/04/2009)    Hypertension flowsheet reviewed?: Yes   Progress toward BP goal: Deteriorated  Self-Management Support :   Personal Goals (by the next clinic visit) :      Personal blood pressure goal: 140/90  (08/02/2009)     Personal LDL goal: 100  (08/02/2009)    Hypertension self-management support: Written self-care plan  (01/11/2010)    Lipid self-management support: Written self-care plan  (01/11/2010)    Nursing Instructions: Schedule screening DXA bone density scan (see order)     Patient Instructions: 1)  Please make an appointment to follow-up in 2 months.  2)  I have sent a prescription for an arthritis medication (Celebrex) to your pharmacy. If this medication is too expensive, please call the clinic and I will prescribe something different. 3)  I have reduced your blood pressure prescription from amlodipine 10mg  to 5mg . Take one tablet daily.  4)  I have reduced your cholesterol medication (simvastatin) to 20mg  daily.

## 2010-05-31 ENCOUNTER — Encounter: Payer: Self-pay | Admitting: Internal Medicine

## 2010-07-08 NOTE — Discharge Summary (Signed)
NAME:  LENNIX, LAMINACK              ACCOUNT NO.:  [id number]   MEDICAL RECORD NO.:  [id number]          PATIENT TYPE:  INP   LOCATION:  2001                         FACILITY:  [facility]   PHYSICIAN:  C. Ulyess Mort, M.D.DATE OF BIRTH:  21-Jun-1940   DATE OF ADMISSION:  10/04/2005  DATE OF DISCHARGE:  10/06/2005                                 DISCHARGE SUMMARY   DISCHARGE DIAGNOSES:  1. Chest pain.  2. Palpitation.  3. Hypertension.  4. Hyperlipidemia.  5. Chronic constipation.  6. Depression and anxiety.  7. History of allergic asthma.  8. Osteoarthritis.   DISCHARGE MEDICATIONS:  1. Norvasc 10 mg p.o. daily.  2. Lipitor 40 mg p.o. daily.  3. Aspirin 81 mg p.o. daily.   DISPOSITION:  Patient was discharged in good condition.  She had no further  symptoms.  She will follow up with Dr. Alanda Amass with a 30-day event monitor  on October 10, 2005.  She will also follow up with Dr. Maple Hudson in the  outpatient clinic at her next regular appointment.   PROCEDURES AND IMAGING:  October 06, 2005:  She had a cardiac catheterization  performed by Dr. Elsie Lincoln, who found patent coronary arteries, a normal left  ventricular systolic function, and all coronary arteries normal with no  significant irregularities.   CONSULTATIONS:  Dr. Elsie Lincoln with cardiology with [facility]  [facility].   ADMISSION HISTORY AND PHYSICAL:  Mrs. Boynes is a 70 year old black woman  with past medical history significant for hypertension, hyperlipidemia, and  anxiety and depression who presented to the outpatient clinic complaining of  palpitation and dull chest pain.  The symptoms began at 1:30 a.m., which  woke her from sleep and lasted from 4 to 5 hours.  The palpitation is  described as pounding, fast heart rate.  The chest pain was dull and located  substernally and under the left breast.  She denied shortness of breath,  lying down made it worse.  She preferred to be propped up with 2 or 3  pillows.   The symptoms reappeared shortly before her clinic visit and lasted  another 2 hours and this time it was relieved by an ice pack.  The  palpitations were associated with headache and dizziness.  The patient  denied acute anxiety precipitating these symptoms, although she was un-  anxious during the symptoms.  The patient has no history of diabetes or  smoking.   PHYSICAL EXAMINATION:  VITAL SIGNS:  On admission, temperature 97.1, blood  pressure 139/76, pulse 68, respirations 20, oxygen 98% on room air.  GENERAL:  She is a pleasant woman in no acute distress.  LUNGS:  Clear to auscultation bilaterally.  CARDIOVASCULAR:  Regular rate and rhythm with no murmurs, rubs, or gallops.  Otherwise, physical exam was unremarkable.   LABORATORY DATA:  On admission, CBC:  White blood cells 2.6 , hemoglobin  11.9, hematocrit 34.3, platelets 209,000.  Chemistries:  Sodium 141,  potassium 3.7, chloride 110, bicarb 25, BUN 10, creatinine 0.8, glucose 100.  D-Dimer 0.39.  Cardiac enzymes:  Creatinine kinase 505, CK-MB 2.7, troponin  I 0.01.  UA was within normal limits.  EKG found new T-wave inversion in  leads 3 and AVF.   HOSPITAL COURSE:  1. Chest pain.  The patient had substernal and left breast, dull, chest      pain that lasted 4 to 5 hours on the morning of admission and then      again later in the day for 1 to 2 hours.  Her differential, on      admission, included acute MI, unstable angina, PE, anxiety, and      vasospasm.  PE was ruled out with a D-Dimer and the EKG was concerning      for MI given her new T-wave inversion.  However, her cardiac enzymes      were negative x3 and cardiology was consulted for a possible unstable      angina.  They subsequently performed cardiac catheterization, which      found no acute findings and patent coronary arteries.  The patient has      been seeing Dr. Alanda Amass at [facility] for      cardiology issues.  She will follow up with  him concerning this chest      pain.  2. Palpitations.  This is a long-standing problem for this patient,      however, they have been getting more frequent and severe.  There is      concern for arrhythmia, although she has never had arrhythmia, she has      never been in the clinic or the hospital at the time of palpitations.      These palpitations are very anxiety provoking for this patient.  She      will follow up with Dr. Alanda Amass on October 10, 2005 at which time she      will get a 30-day event monitor, which will hopefully be able to catch      the palpitation when they occur.  3. Hypertension.  The patient's blood pressure was stable throughout her      admission.  We continued her on her regular home dose of Norvasc.   DISCHARGE VITALS AND LABS:  Vitals:  Temperature 97.4, pulse 60,  respirations 20, blood pressure 125/55, saturating 99% on room air.  Chemistries:  Sodium 141, potassium 3.7, chloride 111, bicarb 26, BUN 16,  creatinine 1, glucose 95.      Hollace Hayward, M.D.  Electronically Signed      C. Ulyess Mort, M.D.  Electronically Signed    TE/MEDQ  D:  10/11/2005  T:  10/12/2005  Job:  [id number]   cc:   Gerlene Burdock A. Alanda Amass, M.D.  Rufina Falco, M.D.

## 2010-07-08 NOTE — Discharge Summary (Signed)
NAME:  Brianna Pittman, Brianna Pittman              ACCOUNT NO.:  [id number]   MEDICAL RECORD NO.:  [id number]          PATIENT TYPE:  INP   LOCATION:  2001                         FACILITY:  [facility]   PHYSICIAN:  C. Ulyess Mort, M.D.DATE OF BIRTH:  [birth year]   DATE OF ADMISSION:  10/04/2005  DATE OF DISCHARGE:  10/06/2005                                 DISCHARGE SUMMARY   DISCHARGE DIAGNOSES:  1. Hypertension.  2. Hyperlipidemia.  3. Depression/anxiety.  4. Chest pain.   DISCHARGE MEDICATIONS:  1. Norvasc 10 mg daily.  2. Lipitor 40 mg daily.  3. Aspirin 81 mg daily.   FOLLOW UP:  Brianna Pittman was to follow up with Dr. Alanda Amass.  She was to  call for an appointment at [phone].   HISTORY OF PRESENT ILLNESS:  Brianna Pittman is a 70 year old woman who came  in complaining of palpitations and dull chest pain when she came into the  clinic.  The symptoms began 1:30 on the morning of admission, woke her out  of her sleep and lasted four to five hours.  The palpitations are described  as pounding with a fast heart rate.  The chest pain is dull and located  substernally under the left breast.  She denied any shortness of breath.  When she lies down, it makes it worse.  She prefers to be propped up on two  to three pillows and symptoms reappear shortly after her clinic visit and  lasted approximately one to two hours.  It was relieved by an ice pack.  The  palpitations are associated with headache and dizziness.   At the time of admission, her vitals were 97.1 temperature, BP of 139/76,  pulse of 68, respiratory rate of 20, O2 saturation of 98% on room air.  She  was in no acute distress.  Regular rate and rhythm.  Negative murmurs.  Clear to auscultation.  Neuro nonfocal.   ADMITTING LABORATORY DATA:  Sodium 141, potassium 3.7, chloride 110, bicarb  of 25, BUN of 10, creatinine of 0.7, glucose of 100.  White count of 2.6,  hemoglobin of 11.9, platelets of 209.  MCV of 94.  D-dimer of 0.39.   Calcium  8.9.  Her cardiac enzymes was CK of 505, CK-MB of 2.7 and troponin of 0.01.  UA was within normal limits.  EKG showed a T-wave inversion in III and aVF.   PROCEDURES:  Cardiac catheterization demonstrated patent coronary arteries  and normal LV function.   HOSPITAL COURSE:  Problem 1:  CHEST PAIN:  She had atypical chest pain but  she had T-wave inversions in her inferior leads which were consistent with  ischemia.  Cardiology consult was called and the patient was cath.  Cath  results demonstrated a left ventricular pressure of 117/0, a central aortic  pressure of 117/55 and a mean of 85.  Her coronary arteries were  angiographically patent.  She had a right coronary artery dominant system.  She had normal LV systolic function.   Problem 2:  HYPERLIPIDEMIA:  Her Lipitor was continued while she stayed in  the hospital.   Problem 3:  DEPRESSION/ANXIETY:  The patient did not want to take her  Effexor today.  She was not started on any medications.  We will monitor her  mood and follow up as an outpatient.   Problem 4:  HYPERTENSION:  Controlled well with Norvasc 10 daily.   Problem 5:  PALPITATIONS:  There was no arrhythmias here at the hospital but  it will be worked up as an outpatient with a 30-day event monitor.  The  patient was discharged home in her normal state of health.      Hollace Hayward, M.D.  Electronically Signed      C. Ulyess Mort, M.D.  Electronically Signed    TE/MEDQ  D:  11/29/2005  T:  11/29/2005  Job:  [id number]

## 2010-07-08 NOTE — Op Note (Signed)
NAME:  Brianna Pittman, Brianna Pittman                          ACCOUNT NO.:  [id number]   MEDICAL RECORD NO.:  [id number]                   PATIENT TYPE:  AMB   LOCATION:  DSC                                  FACILITY:  [facility]   PHYSICIAN:  Harvie Junior, M.D.                DATE OF BIRTH:  [birth year]   DATE OF PROCEDURE:  02/27/2003  DATE OF DISCHARGE:                                 OPERATIVE REPORT   PREOPERATIVE DIAGNOSIS:  Lateral meniscal tear and medial compartment  degenerative changes.   POSTOPERATIVE DIAGNOSES:  1. Lateral meniscal tear.  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  3. Chondromalacia of the patellofemoral joint.   PROCEDURES:  1. Debridement of  anterolateral meniscal tear.  2. Debridement of chondromalacia of the medial femoral condyle.  3. Debridement of chondromalacia of the patellofemoral joint.   SURGEON:  Harvie Junior, M.D.   ASSISTANT:  Marshia Ly, P.A.   ANESTHESIA:  General.   INDICATIONS FOR PROCEDURE:  [name] is a 70 year old female with a long  history of having had significant right knee pain. She ultimately was  evaluated in the office and found to have significant lateral  joint line  tenderness. It was felt that  she may have a meniscal tear. An MRI had  previously been obtained in the outside clinic which showed that she had a  lateral meniscal tear and some degenerative changes. After failure of  conservative care and failure of injection therapy, the patient was  ultimately taken to the operating room for arthroscopic debridement.   DESCRIPTION OF PROCEDURE:  The patient was taken to the operating room and  after adequate anesthesia was obtained with general anesthetic the patient  was placed on the operating table. The right leg was then prepped and draped  in the usual sterile fashion.   Following  this routine arthroscopic examination of the knee revealed an  obvious anterolateral meniscal tear. This was debrided with a  suction shaver  back to a smooth and stable rim. The lateral  femoral condyle showed 1 small  area of grade 3 changes. It was debrided with a suction shaver. The medial  femoral condyle showed some grade 3 change in the area of the articulation  with the patella; this was debrided. The patella was debrided from width on  the undersurface to clean out the patellofemoral joint.   Once this had been achieved the wound was copiously irrigated and suctioned  dry. Attention at this time was  turned towards the bandage. A sterile  compressive dressing was applied and the patient was taken to the recovery  room where she was  noted to be in satisfactory condition. Estimated blood  loss was for the procedure was none.  Harvie Junior, M.D.    Ranae Plumber  D:  02/27/2003  T:  02/28/2003  Job:  [id number]

## 2010-07-08 NOTE — Cardiovascular Report (Signed)
NAME:  Brianna Pittman, Brianna Pittman NO.:  [id number]   MEDICAL RECORD NO.:  [id number]          PATIENT TYPE:  INP   LOCATION:  2001                         FACILITY:  [facility]   PHYSICIAN:  Madaline Savage, M.D.DATE OF BIRTH:  1940-10-17   DATE OF PROCEDURE:  10/06/2005  DATE OF DISCHARGE:                              CARDIAC CATHETERIZATION   PROCEDURE PERFORMED:  1. Selective coronary angiography by Judkins' technique.  2. Retrograde left heart catheterization.  3. Left ventricular angiography.  4. Successful right percutaneous femoral artery Angio-Seal closure.   COMPLICATIONS:  None.   PATIENT PROFILE:  The patient is a 70 year old African-American woman with a  recent history of chest pain, hypertension, hyperlipidemia and depression  and anxiety.  She has also had a history of tachycardia with chest pain  going back some 15 years.  She has had transient ST-segment changes in the  inferolateral leads on her EKG suggestive of ischemia.  Today, the patient  presents to the cath lab for diagnostic cardiac catheterization which was  completed without complication.   RESULTS:  Pressures:  The left ventricular pressure was 117/0.  End-  diastolic pressure is 7.  Central aortic pressure 117/55, mean of 85.  No  aortic valve gradient by pullback technique.   ANGIOGRAPHIC RESULTS:  The coronary arteries were all widely patent and  normal.  Anatomically, the left main was large in circumference with mild  calcification.  The LAD coursed to the cardiac apex giving rise to 1 major  diagonal branch.  The left circumflex was non-dominant and gave rise to a  large atrial circumflex branch and intermediate ramus branch and obtuse  marginal branch and distally terminated as a posterolateral branch.   Right coronary artery was large and dominant and gave rise to a posterior  descending branch distally and then on very, very large posterolateral  branch.  Repeat:  All coronary  arteries were normal without any significant  luminal irregularities.  The left ventricular angiogram showed papillary  muscle hypertrophy, ejection fraction 65%.  No evidence of mitral  regurgitation.   FINAL DIAGNOSES:  1. Angiographic patent coronary arteries of the right coronary artery      dominant system.  2. Normal LV systolic function.  3. Chest pain, recurrent.   PLAN:  The patient underwent Angio-Seal closure.  She will be returned to  the holding area of the cath lab.  She is able to ambulate in 1 to 2 hours.  We will return her care to the physicians on the [facility] internal  medicine teaching service.           ______________________________  Madaline Savage, M.D.     WHG/MEDQ  D:  10/06/2005  T:  10/06/2005  Job:  [id number]   cc:   Hollace Hayward, M.D.  Medical Records, [facility]  Cath Lab - [facility]

## 2010-07-08 NOTE — Discharge Summary (Signed)
NAME:  Brianna Pittman, Brianna Pittman              ACCOUNT NO.:  [id number]   MEDICAL RECORD NO.:  [id number]          PATIENT TYPE:  INP   LOCATION:  4705                         FACILITY:  [facility]   PHYSICIAN:  Richard A. Alanda Amass, M.D.DATE OF BIRTH:  1940-04-16   DATE OF ADMISSION:  10/29/2004  DATE OF DISCHARGE:  10/30/2004                                 DISCHARGE SUMMARY   ADMISSION DIAGNOSES:  1.  Atypical chest pain. Rule out myocardial infarction.  2.  Hypertension.  3.  Hyperlipidemia.   DISCHARGE DIAGNOSES:  1.  Atypical chest pain. Rule out myocardial infarction.  2.  Hypertension.  3.  Hyperlipidemia.   PROCEDURE:  None.   BRIEF HISTORY:  The patient is a 70 year old black female who presented to  primary care for chest pain. The patient says she has had a cath in 1999  which was normal. She has had chest pain for 20 years. Yesterday, she  developed substernal chest pain with no shortness of breath or diaphoresis.  She was referred to the ER and our office for further treatment.   PAST MEDICAL HISTORY:  1.  Hypertension.  2.  Hypercholesterolemia.  3.  C-section times four.  4.  Hysterectomy.  5.  Breast reduction.  6.  History of anxiety.   MEDICATIONS ON ADMISSION:  1.  Lipitor 40.  2.  Hydrochlorothiazide.  3.  Naprosyn.   ALLERGIES:  No known drug allergies.   SOCIAL HISTORY:  She does not smoke or use alcohol. She is single and has  three children. She is a semi-retired CNA.   For further history and physical, please see the dictated note.   HOSPITAL COURSE:  The patient was admission. Her point of care markers in  the ER were negative. Her electrolytes were normal, glucose 115, BUN 18,  creatinine 1. LFTs were normal. Hemoglobin 12.1, hematocrit 36, white count  7.1.  Point of care markers were negative. The patient refused to have any  further labs drawn. The patient was stable overnight. She was complaining of  significant discomfort the following a.m. and it  was Dr. Kandis Cocking opinion  that we could follow her up with outpatient Cardiolite. She was placed on  aspirin 325 mg daily, Lipitor 40 mg daily, Lopressor 25 mg b.i.d., Protonix  40 mg daily, and there is some place on her reconciliation form that says  she is on Zetia 10 mg daily will continue that. Will have her follow-up with  a Cardiolite in the outpatient next week and then see Dr. Alanda Amass after  that.      Eber Hong, P.A.      Richard A. Alanda Amass, M.D.  Electronically Signed    WDJ/MEDQ  D:  10/30/2004  T:  10/30/2004  Job:  [id number]   cc:   [facility]

## 2010-07-25 ENCOUNTER — Ambulatory Visit (INDEPENDENT_AMBULATORY_CARE_PROVIDER_SITE_OTHER): Payer: Medicare HMO | Admitting: Internal Medicine

## 2010-07-25 ENCOUNTER — Encounter: Payer: Self-pay | Admitting: Internal Medicine

## 2010-07-25 DIAGNOSIS — I10 Essential (primary) hypertension: Secondary | ICD-10-CM

## 2010-07-25 DIAGNOSIS — E785 Hyperlipidemia, unspecified: Secondary | ICD-10-CM

## 2010-07-25 DIAGNOSIS — K219 Gastro-esophageal reflux disease without esophagitis: Secondary | ICD-10-CM

## 2010-07-25 DIAGNOSIS — M1990 Unspecified osteoarthritis, unspecified site: Secondary | ICD-10-CM

## 2010-07-25 MED ORDER — OMEPRAZOLE 20 MG PO CPDR: 20.0000 mg | DELAYED_RELEASE_CAPSULE | Freq: Every day | ORAL | Status: DC

## 2010-07-25 MED ORDER — SIMVASTATIN 20 MG PO TABS: 20.0000 mg | ORAL_TABLET | Freq: Every evening | ORAL | Status: DC

## 2010-07-25 MED ORDER — HYDROCODONE-ACETAMINOPHEN 5-500 MG PO TABS: 2.0000 | ORAL_TABLET | Freq: Four times a day (QID) | ORAL | Status: DC | PRN

## 2010-07-25 MED ORDER — ASPIRIN EC 81 MG PO TBEC: 81.0000 mg | DELAYED_RELEASE_TABLET | Freq: Every day | ORAL | Status: AC

## 2010-07-25 MED ORDER — AMLODIPINE BESYLATE 5 MG PO TABS: 5.0000 mg | ORAL_TABLET | Freq: Every day | ORAL | Status: DC

## 2010-07-25 MED ORDER — DICLOFENAC SODIUM 75 MG PO TBEC: 75.0000 mg | DELAYED_RELEASE_TABLET | Freq: Two times a day (BID) | ORAL | Status: DC

## 2010-07-25 NOTE — Progress Notes (Signed)
Subjective:    Patient ID: Brianna Pittman, female    DOB: 06-15-1940, 70 y.o.   MRN: [id number]  HPI  Patient is a 70 year old female with a past medical history of hypertension, hyperlipidemia, GERD, and osteoarthritis presents to the outpatient clinic today for routine followup, patient has no specific complaints today, states that she was on a trip to visit her brother in [place] she was also in [place], troubled by a train, during her trip she experienced left hip pain that radiated down to her leg she had this worked up a hospital in [place] with a negative workup for DVT. It is not aggravated by any movement, no tender point. Likely secondary to her chronic osteoarthritis. She also inquires about her likelihood of having diabetes, discussed with patient her blood pressure control and cholesterol levels. She reports not being very compliant with her blood pressure pills reporting that she does not use them frequently. Patient inquires about Vicodin for pain control for her osteoarthritis, and she received some in [place] which helped relieve her pain. Denies chest pain shortness of breath, fever, nausea or vomiting.   Review of Systems  [all other systems reviewed and are negative       Objective:   Physical Exam  [vitalsreviewed. Constitutional: She is oriented to person, place, and time. She appears well-developed and well-nourished.  HENT:  Head: Normocephalic and atraumatic.  Eyes: Pupils are equal, round, and reactive to light.  Neck: Normal range of motion. Neck supple. No JVD present. No thyromegaly present.  Cardiovascular: Normal rate, regular rhythm and normal heart sounds.  Exam reveals no gallop and no friction rub.   No murmur heard. Pulmonary/Chest: Effort normal and breath sounds normal. She has no wheezes. She has no rales.  Abdominal: Soft. Bowel sounds are normal.  Musculoskeletal: Normal range of motion. She exhibits no edema.  Neurological: She is alert and  oriented to person, place, and time.  Skin: Skin is warm and dry.          Assessment & Plan:

## 2010-07-25 NOTE — Assessment & Plan Note (Signed)
She is on simvastatin 20, last LDL was 159, will recheck fasting lipid at next followup and we may need to adjust her simvastatin dose for optimal control. His target LDL is less than 130

## 2010-07-25 NOTE — Assessment & Plan Note (Signed)
Patient reports pain of her left hip, no tender points, likely significant chronic osteoarthritis, patient had workup which was negative for DVT last week. Given that she has received Vicodin and has provided her with pain relief I will give her NSAIDs for chronic osteoarthritis along with Vicodin for breakthrough pain. We'll see again in 3 months

## 2010-07-25 NOTE — Assessment & Plan Note (Signed)
Patient's blood pressure is mildly elevated today however patient reports noncompliance with her medications, I encouraged the patient to take her medications frequently and the importance of taking her medications. We'll reassess compliance in 3 months

## 2010-09-12 ENCOUNTER — Telehealth: Payer: Self-pay | Admitting: *Deleted

## 2010-09-12 NOTE — Telephone Encounter (Signed)
Pt calls c/o chest pain at intervals since Friday, "a heavy, tiring pain" she also c/o hip and knee pain. i have ask the pt to go to ED but she has refused repeatedly asking for an appt for Tuesday.  i spoke w/ dr Meredith Pel and he ask for me to stress the importance, i will do so again, i have also checked w/ doriss. And there are no appts available tomorrow. i called the pt back and spoke w/ her again, i stressed the point that the chest pain could be something very serious and that she needed to go to the ED now, not drive but call [phone]. After the fourth time she has agreed to go to ED only having her daughter drive her and going to [facility]. i encouraged her to do so now and she states she will.

## 2010-09-13 ENCOUNTER — Other Ambulatory Visit: Payer: Self-pay | Admitting: Gastroenterology

## 2010-09-13 NOTE — Telephone Encounter (Signed)
I agree, thanks for letting me know.

## 2010-12-23 ENCOUNTER — Other Ambulatory Visit: Payer: Self-pay | Admitting: Internal Medicine

## 2010-12-23 DIAGNOSIS — E785 Hyperlipidemia, unspecified: Secondary | ICD-10-CM

## 2010-12-28 NOTE — Telephone Encounter (Signed)
Last note states simvastatin dose 20 mg PO QHS.  I see no interim notes stating that the dose was changed.  Therefore, will prescribe 20 mg tablets rather than the 40 mg tablet requested.  Will have Dr. Gilford Rile clarify and document clarification.

## 2011-01-09 ENCOUNTER — Encounter: Payer: Medicare HMO | Admitting: Internal Medicine

## 2011-01-24 ENCOUNTER — Ambulatory Visit (HOSPITAL_COMMUNITY)
Admission: RE | Admit: 2011-01-24 | Discharge: 2011-01-24 | Disposition: A | Discharge status: Home / Self Care | Payer: Medicare HMO | Source: Ambulatory Visit | Attending: Internal Medicine | Admitting: Internal Medicine

## 2011-01-24 ENCOUNTER — Encounter: Payer: Self-pay | Admitting: Internal Medicine

## 2011-01-24 ENCOUNTER — Ambulatory Visit (INDEPENDENT_AMBULATORY_CARE_PROVIDER_SITE_OTHER): Payer: Medicare HMO | Admitting: Internal Medicine

## 2011-01-24 DIAGNOSIS — M6281 Muscle weakness (generalized): Secondary | ICD-10-CM

## 2011-01-24 DIAGNOSIS — E785 Hyperlipidemia, unspecified: Secondary | ICD-10-CM

## 2011-01-24 DIAGNOSIS — I10 Essential (primary) hypertension: Secondary | ICD-10-CM

## 2011-01-24 DIAGNOSIS — M79609 Pain in unspecified limb: Secondary | ICD-10-CM | POA: Insufficient documentation

## 2011-01-24 DIAGNOSIS — IMO0001 Reserved for inherently not codable concepts without codable children: Secondary | ICD-10-CM

## 2011-01-24 DIAGNOSIS — M19049 Primary osteoarthritis, unspecified hand: Secondary | ICD-10-CM | POA: Insufficient documentation

## 2011-01-24 DIAGNOSIS — M791 Myalgia, unspecified site: Secondary | ICD-10-CM

## 2011-01-24 LAB — LIPID PANEL
Cholesterol: 193 mg/dL (ref 0–200)
HDL: 57 mg/dL (ref >39)
LDL Cholesterol: 119 mg/dL — ABNORMAL HIGH (ref 0–99)
Total CHOL/HDL Ratio: 3.4 Ratio
Triglycerides: 83 mg/dL (ref <150)
VLDL: 17 mg/dL (ref 0–40)

## 2011-01-24 LAB — URIC ACID: Uric Acid, Serum: 3.8 mg/dL (ref 2.4–7.0)

## 2011-01-24 LAB — C-REACTIVE PROTEIN: CRP: 0.3 mg/dL (ref <0.60)

## 2011-01-24 LAB — COMPREHENSIVE METABOLIC PANEL
ALT: 12 U/L (ref 0–35)
AST: 17 U/L (ref 0–37)
Albumin: 3.8 g/dL (ref 3.5–5.2)
Alkaline Phosphatase: 70 U/L (ref 39–117)
BUN: 15 mg/dL (ref 6–23)
CO2: 27 meq/L (ref 19–32)
Calcium: 8.6 mg/dL (ref 8.4–10.5)
Chloride: 106 meq/L (ref 96–112)
Creat: 1.08 mg/dL (ref 0.50–1.10)
Glucose, Bld: 137 mg/dL — ABNORMAL HIGH (ref 70–99)
Potassium: 4.1 meq/L (ref 3.5–5.3)
Sodium: 137 mEq/L (ref 135–145)
Total Bilirubin: 0.3 mg/dL (ref 0.3–1.2)
Total Protein: 7.2 g/dL (ref 6.0–8.3)

## 2011-01-24 LAB — CK: Total CK: 310 U/L — ABNORMAL HIGH (ref 7–177)

## 2011-01-24 LAB — TSH: TSH: 0.93 u[IU]/mL (ref 0.350–4.500)

## 2011-01-24 LAB — RHEUMATOID FACTOR: Rheumatoid fact SerPl-aCnc: 10 [IU]/mL (ref <=14)

## 2011-01-24 MED ORDER — OXYCODONE-ACETAMINOPHEN 5-325 MG PO TABS: 1.0000 | ORAL_TABLET | Freq: Four times a day (QID) | ORAL | Status: DC | PRN

## 2011-01-24 NOTE — Progress Notes (Signed)
Subjective:    Patient ID: Brianna Pittman, female    DOB: [birth year], 70 y.o.   MRN: [id number]  HPI  Patient is a 70 year old female with a past medical history listed below, presents to outpatient clinic for routine followup, she complains of generalized muscle weakness and pain and right hand MCP joint pain she states that this has been going on for the past several months, denies any fever chills nausea or vomiting. Patient would like a prescription for oxycodone 30 mg for her pain, as she states that that has worked for her in the past, she refuses all other medications and states that they have too many side effects. Today also requests clindamycin acne cream, despite that her face is clear of acne, I think that she is requesting this for somebody else.   Patient Active Problem List  Diagnoses  . VITAMIN D DEFICIENCY  . HYPERLIPIDEMIA  . ANEMIA  . PANIC DISORDER  . DEPRESSION  . HYPERTENSION  . GERD  . OSTEOARTHRITIS   Current Outpatient Prescriptions on File Prior to Visit  Medication Sig Dispense Refill  . amLODipine (NORVASC) 5 MG tablet Take 1 tablet (5 mg total) by mouth daily.  30 tablet  11  . aspirin EC 81 MG tablet Take 1 tablet (81 mg total) by mouth daily.  150 tablet  2  . omeprazole (PRILOSEC) 20 MG capsule TAKE 1 CAPSULE BY MOUTH 20 TO 30 MINUTES PRIOR TO BREAKFAST MEAL  90 capsule  0  . simvastatin (ZOCOR) 20 MG tablet Take 1 tablet (20 mg total) by mouth at bedtime.  30 tablet  2  . DISCONTD: simvastatin (ZOCOR) 20 MG tablet Take 1 tablet (20 mg total) by mouth every evening.  30 tablet  11   Allergies  Allergen Reactions  . Ibuprofen     REACTION: itching    Review of Systems  Musculoskeletal: Positive for myalgias and arthralgias. Negative for back pain, joint swelling and gait problem.  All other systems reviewed and are negative.       Objective:   Physical Exam  Nursing note and vitals reviewed. Constitutional: She is oriented to person, place,  and time. She appears well-developed and well-nourished.  HENT:  Head: Normocephalic and atraumatic.  Eyes: Pupils are equal, round, and reactive to light.  Neck: Normal range of motion. Neck supple. No JVD present. No thyromegaly present.  Cardiovascular: Normal rate, regular rhythm and normal heart sounds.   No murmur heard. Pulmonary/Chest: Effort normal and breath sounds normal. She has no wheezes. She has no rales.  Abdominal: Soft. Bowel sounds are normal.  Musculoskeletal: Normal range of motion. She exhibits no edema.  Neurological: She is alert and oriented to person, place, and time.  Skin: Skin is warm and dry.          Assessment & Plan:

## 2011-01-24 NOTE — Assessment & Plan Note (Addendum)
Patient complains of generalized muscle weakness and pain, this has been ongoing for the past several months, the differential is wide therefore we'll start by checking routine blood work including muscle creatinine, liver function tests given that she is on statins, CRP, rheumatoid factor, ANA, uric acid level and TSH to rule out other common causes of myalgia and weakness. If all this is negative patient most likely has fibromyalgia. However note patient refused to be treated for this as she explains that she does not like taking medications and she is free of side effects and only accepts oxycodone as a form of treatment. I have reluctantly agreed to give her 20 tablets of oxycodone for breakthrough pain, and encouraged to take Tylenol as needed  Update 01/25/11 patients labs are back normal except for elevated CK, given this and her symptoms this is likely due to statin use, I have advised the patient to stop her statin and recheck clinically at next visit her progress, if fails to improve will look at other causes. Note that I have discussed this plain with Dr. Josem Kaufmann.

## 2011-01-24 NOTE — Assessment & Plan Note (Signed)
Well controlled on current treatment, No new changes made today, Will continue to monitor.   

## 2011-01-24 NOTE — Assessment & Plan Note (Signed)
on simvastatin, check fasting lipid and liver function tests today

## 2011-01-24 NOTE — Patient Instructions (Signed)
Myalgia, Adult Myalgia is the medical term for muscle pain. It is a symptom of many things. Nearly everyone at some time in their life has this. The most common cause for muscle pain is overuse or straining and more so when you are not in shape. Injuries and muscle bruises cause myalgias. Muscle pain without a history of injury can also be caused by a virus. It frequently comes along with the flu. Myalgia not caused by muscle strain can be present in a large number of infectious diseases. Some autoimmune diseases like lupus and fibromyalgia can cause muscle pain. Myalgia may be mild, or severe. SYMPTOMS  The symptoms of myalgia are simply muscle pain. Most of the time this is short lived and the pain goes away without treatment. DIAGNOSIS  Myalgia is diagnosed by your caregiver by taking your history. This means you tell him when the problems began, what they are, and what has been happening. If this has not been a long term problem, your caregiver may want to watch for a while to see what will happen. If it has been long term, they may want to do additional testing. TREATMENT  The treatment depends on what the underlying cause of the muscle pain is. Often anti-inflammatory medications will help. HOME CARE INSTRUCTIONS  If the pain in your muscles came from overuse, slow down your activities until the problems go away.   Myalgia from overuse of a muscle can be treated with alternating hot and cold packs on the muscle affected or with cold for the first couple days. If either heat or cold seems to make things worse, stop their use.   Apply ice to the sore area for 15 to 20 minutes, 3 to 4 times per day, while awake for the first 2 days of muscle soreness, or as directed. Put the ice in a plastic bag and place a towel between the bag of ice and your skin.   Only take over-the-counter or prescription medicines for pain, discomfort, or fever as directed by your caregiver.   Regular gentle exercise may  help if you are not active.   Stretching before strenuous exercise can help lower the risk of myalgia. It is normal when beginning an exercise regimen to feel some muscle pain after exercising. Muscles that have not been used frequently will be sore at first. If the pain is extreme, this may mean injury to a muscle.  SEEK MEDICAL CARE IF:  You have an increase in muscle pain that is not relieved with medication.   You begin to run a temperature.   You develop nausea and vomiting.   You develop a stiff and painful neck.   You develop a rash.   You develop muscle pain after a tick bite.   You have continued muscle pain while working out even after you are in good condition.  SEEK IMMEDIATE MEDICAL CARE IF: Any of your problems are getting worse and medications are not helping. MAKE SURE YOU:   Understand these instructions.   Will watch your condition.   Will get help right away if you are not doing well or get worse.  Document Released: 12/29/2005 Document Revised: 10/19/2010 Document Reviewed: 03/20/2006 [facility] Patient Information 2012 [place], [place].

## 2011-01-25 LAB — ANA: Anti Nuclear Antibody(ANA): NEGATIVE

## 2011-01-25 NOTE — Progress Notes (Signed)
Addended by: Darnelle Maffucci on: 01/25/2011 09:22 AM   Modules accepted: Orders, Medications

## 2011-04-24 ENCOUNTER — Encounter: Payer: Self-pay | Admitting: Internal Medicine

## 2011-04-24 ENCOUNTER — Ambulatory Visit (INDEPENDENT_AMBULATORY_CARE_PROVIDER_SITE_OTHER): Payer: Medicare Other | Admitting: Internal Medicine

## 2011-04-24 VITALS — BP 176/80 | HR 73 | Temp 97.9°F | Ht 64.0 in | Wt 181.0 lb

## 2011-04-24 DIAGNOSIS — R079 Chest pain, unspecified: Secondary | ICD-10-CM

## 2011-04-24 DIAGNOSIS — M1990 Unspecified osteoarthritis, unspecified site: Secondary | ICD-10-CM

## 2011-04-24 DIAGNOSIS — D369 Benign neoplasm, unspecified site: Secondary | ICD-10-CM

## 2011-04-24 DIAGNOSIS — I10 Essential (primary) hypertension: Secondary | ICD-10-CM

## 2011-04-24 DIAGNOSIS — K219 Gastro-esophageal reflux disease without esophagitis: Secondary | ICD-10-CM

## 2011-04-24 MED ORDER — DOCUSATE SODIUM 100 MG PO CAPS: 100.0000 mg | ORAL_CAPSULE | Freq: Two times a day (BID) | ORAL | Status: DC

## 2011-04-24 MED ORDER — RABEPRAZOLE SODIUM 20 MG PO TBEC: 20.0000 mg | DELAYED_RELEASE_TABLET | Freq: Every day | ORAL | Status: DC

## 2011-04-24 NOTE — Patient Instructions (Signed)
Diet for GERD or PUD Nutrition therapy can help ease the discomfort of gastroesophageal reflux disease (GERD) and peptic ulcer disease (PUD).  HOME CARE INSTRUCTIONS   Eat your meals slowly, in a relaxed setting.   Eat 5 to 6 small meals per day.   If a food causes distress, stop eating it for a period of time.  FOODS TO AVOID  Coffee, regular or decaffeinated.   Cola beverages, regular or low calorie.   Tea, regular or decaffeinated.   Pepper.   Cocoa.   High fat foods, including meats.   Butter, margarine, hydrogenated oil (trans fats).   Peppermint or spearmint (if you have GERD).   Fruits and vegetables if not tolerated.   Alcohol.   Nicotine (smoking or chewing). This is one of the most potent stimulants to acid production in the gastrointestinal tract.   Any food that seems to aggravate your condition.  If you have questions regarding your diet, ask your caregiver or a registered dietitian. TIPS  Lying flat may make symptoms worse. Keep the head of your bed raised 6 to 9 inches (15 to 23 cm) by using a foam wedge or blocks under the legs of the bed.   Do not lay down until 3 hours after eating a meal.   Daily physical activity may help reduce symptoms.  MAKE SURE YOU:   Understand these instructions.   Will watch your condition.   Will get help right away if you are not doing well or get worse.  Document Released: 02/06/2005 Document Revised: 01/26/2011 Document Reviewed: 12/23/2010 [employer] Patient Information 2012 [employer]. 

## 2011-04-24 NOTE — Progress Notes (Signed)
Subjective:    Patient ID: Brianna Pittman, female    DOB: 1940/05/12, 71 y.o.   MRN: [id number]  HPI  Dull pain in the chest which is radiating to shoulder both sides, back and below the breast which is worse with drinking orange juice, associated with change in voice and throat irritability. Pain has been present since a long time. Pain is 8/10 at this time. Patient has tried maalox and tums which makes it better. She has h/o constipation and the last BM was Friday which is 3 days ago. She uses sennakot and milk of magnesia for contipation. Constipation has been present for years.  Her BP is high today at 176/80. May be related to pain?     Review of Systems  Constitutional: Negative for fever, activity change and appetite change.  HENT: Positive for voice change. Negative for sore throat.   Respiratory: Positive for chest tightness. Negative for cough and shortness of breath.   Cardiovascular: Positive for chest pain. Negative for leg swelling.  Gastrointestinal: Positive for constipation. Negative for nausea, vomiting, abdominal pain, diarrhea and abdominal distention.  Genitourinary: Negative for frequency, hematuria and difficulty urinating.  Skin: Positive for rash.  Neurological: Negative for dizziness and headaches.  Psychiatric/Behavioral: Negative for suicidal ideas and behavioral problems.       Objective:   Physical Exam  Constitutional: She is oriented to person, place, and time. She appears well-developed and well-nourished.  HENT:  Head: Normocephalic and atraumatic.  Eyes: Conjunctivae and EOM are normal. Pupils are equal, round, and reactive to light. No scleral icterus.  Cardiovascular: Normal rate, regular rhythm, normal heart sounds and intact distal pulses.  Exam reveals no gallop and no friction rub.   No murmur heard. Pulmonary/Chest: Effort normal and breath sounds normal. No respiratory distress. She has no wheezes. She has no rales.  Abdominal: Soft. Bowel  sounds are normal. She exhibits no distension and no mass. There is no tenderness. There is no rebound and no guarding.  Musculoskeletal: Normal range of motion. She exhibits no edema.  Lymphadenopathy:    She has no cervical adenopathy.  Neurological: She is alert and oriented to person, place, and time.  Psychiatric: She has a normal mood and affect. Her behavior is normal.          Assessment & Plan:

## 2011-04-25 NOTE — Assessment & Plan Note (Signed)
See Chest pain

## 2011-04-25 NOTE — Assessment & Plan Note (Signed)
Chronic recurrent chest pain. The description is typical with GERD/PUD. Patient has had upper GI endoscopy in the past by Dr. Christella Hartigan in 2010 which did not show PUD.  Although the efficacy of all PPI's are similar theoretically, I will change her prilosec to rebeprazole at this time since omeprazole seems to be ineffective at this time. I have given her information regarding dietary changes. Refer to Dr. Christella Hartigan again for reevaluation at this time.

## 2011-04-25 NOTE — Assessment & Plan Note (Signed)
Elevated today. Will recheck at next office visit in 15 days as patient's BP was well controlled in the past.

## 2011-04-25 NOTE — Assessment & Plan Note (Signed)
Patient does admit that she occasionally takes goody powders. I have advided her to refrain from it completely. Advised to take tylenol as needed.

## 2011-05-02 ENCOUNTER — Encounter: Payer: Self-pay | Admitting: Gastroenterology

## 2011-05-02 ENCOUNTER — Ambulatory Visit (INDEPENDENT_AMBULATORY_CARE_PROVIDER_SITE_OTHER): Payer: Medicare Other | Admitting: Gastroenterology

## 2011-05-02 VITALS — BP 116/60 | HR 64 | Ht 64.0 in | Wt 179.2 lb

## 2011-05-02 DIAGNOSIS — K219 Gastro-esophageal reflux disease without esophagitis: Secondary | ICD-10-CM

## 2011-05-02 DIAGNOSIS — Z860100 Personal history of colon polyps, unspecified: Secondary | ICD-10-CM

## 2011-05-02 DIAGNOSIS — K625 Hemorrhage of anus and rectum: Secondary | ICD-10-CM

## 2011-05-02 DIAGNOSIS — R198 Other specified symptoms and signs involving the digestive system and abdomen: Secondary | ICD-10-CM

## 2011-05-02 DIAGNOSIS — R194 Change in bowel habit: Secondary | ICD-10-CM

## 2011-05-02 MED ORDER — PEG-KCL-NACL-NASULF-NA ASC-C 100 G PO SOLR: 1.0000 | ORAL | Status: DC

## 2011-05-02 NOTE — Patient Instructions (Addendum)
You should change the way you are taking your antiacid medicine (aciphex OR omprazole pill) so that you are taking it 20-30 minutes prior to a decent meal as that is the way the pill is designed to work most effectively. You will be set up for an upper endoscopy. Peppermint can make GERD, acid symptoms worse. GERD handout. You will be set up for a colonoscopy for change in bowels, minor bleeding, history of polpys. Please start taking citrucel (orange flavored) powder fiber supplement.  This may cause some bloating at first but that usually goes away. Begin with a small spoonful and work your way up to a large, heaping spoonful daily over a week.  Gastroesophageal reflux disease (GERD) happens when acid from your stomach flows up into the esophagus. When acid comes in contact with the esophagus, the acid causes soreness (inflammation) in the esophagus. Over time, GERD may create small holes (ulcers) in the lining of the esophagus. CAUSES   Increased body weight. This puts pressure on the stomach, making acid rise from the stomach into the esophagus.   Smoking. This increases acid production in the stomach.   Drinking alcohol. This causes decreased pressure in the lower esophageal sphincter (valve or ring of muscle between the esophagus and stomach), allowing acid from the stomach into the esophagus.   Late evening meals and a full stomach. This increases pressure and acid production in the stomach.   A malformed lower esophageal sphincter.  Sometimes, no cause is found. SYMPTOMS   Burning pain in the lower part of the mid-chest behind the breastbone and in the mid-stomach area. This may occur twice a week or more often.   Trouble swallowing.   Sore throat.   Dry cough.   Asthma-like symptoms including chest tightness, shortness of breath, or wheezing.  DIAGNOSIS  Your caregiver may be able to diagnose GERD based on your symptoms. In some cases, X-rays and other tests may be done to  check for complications or to check the condition of your stomach and esophagus. TREATMENT  Your caregiver may recommend over-the-counter or prescription medicines to help decrease acid production. Ask your caregiver before starting or adding any new medicines.  HOME CARE INSTRUCTIONS   Change the factors that you can control. Ask your caregiver for guidance concerning weight loss, quitting smoking, and alcohol consumption.   Avoid foods and drinks that make your symptoms worse, such as:   Caffeine or alcoholic drinks.   Chocolate.   Peppermint or mint flavorings.   Garlic and onions.   Spicy foods.   Citrus fruits, such as oranges, lemons, or limes.   Tomato-based foods such as sauce, chili, salsa, and pizza.   Fried and fatty foods.   Avoid lying down for the 3 hours prior to your bedtime or prior to taking a nap.   Eat small, frequent meals instead of large meals.   Wear loose-fitting clothing. Do not wear anything tight around your waist that causes pressure on your stomach.   Raise the head of your bed 6 to 8 inches with wood blocks to help you sleep. Extra pillows will not help.   Only take over-the-counter or prescription medicines for pain, discomfort, or fever as directed by your caregiver.   Do not take aspirin, ibuprofen, or other nonsteroidal anti-inflammatory drugs (NSAIDs).  SEEK IMMEDIATE MEDICAL CARE IF:   You have pain in your arms, neck, jaw, teeth, or back.   Your pain increases or changes in intensity or duration.   You  develop nausea, vomiting, or sweating (diaphoresis).   You develop shortness of breath, or you faint.   Your vomit is green, yellow, black, or looks like coffee grounds or blood.   Your stool is red, bloody, or black.  These symptoms could be signs of other problems, such as heart disease, gastric bleeding, or esophageal bleeding. MAKE SURE YOU:   Understand these instructions.   Will watch your condition.   Will get help  right away if you are not doing well or get worse.  Document Released: 11/16/2004 Document Revised: 01/26/2011 Document Reviewed: 08/26/2010 [facility] Patient Information 2012 [place], [place].

## 2011-05-02 NOTE — Progress Notes (Signed)
Review of gastrointestinal problems:  1. Small tubular adenoma, screening colonoscopy with me 09/2006, single TA found, recommended repeat colonoscopy at 5 year inteval.  2. dyspepsia, possibly GERD related. EGD February 2010 was normal. Symptoms improved with proton pump inhibitor    HPI: This is a    very pleasant 71 year old woman whom I last saw about 2 years ago.  She is "still having the problem in her stomach."  She has intermittent pains in neck, head, left breast.  Sometimes that pain is very bad.  She takes PPI once daily.  Has been on omeprazole for a long time and she does not think it helps at all.  Since last week she started to avoid spicey foods, acid contained.  She has  She takes PPI after breakfast, never before.  No caffeine.  No alcohol. Non-smoker.  She takes peppermint tea, peppermint candies.  Stopped last week.  Dysphagia to solid food, it does not seem to be getting worse.  She has intermittent laryngitis.  She has nausea periodically.  She has trouble with constipation recently, she sees intermittent mild red blood in her stool.  wieght has been stable.   Past Medical History  Diagnosis Date  . Hypertension   . Hyperlipidemia   . Depression with anxiety   . Chest pain   . Benign neoplasm of unspecified site   . Esophageal reflux   . Osteoarthrosis, unspecified whether generalized or localized, unspecified site     No past surgical history on file.  Current Outpatient Prescriptions  Medication Sig Dispense Refill  . amLODipine (NORVASC) 5 MG tablet Take 1 tablet (5 mg total) by mouth daily.  30 tablet  11  . aspirin EC 81 MG tablet Take 1 tablet (81 mg total) by mouth daily.  150 tablet  2  . docusate sodium (COLACE) 100 MG capsule Take 1 capsule (100 mg total) by mouth 2 (two) times daily.  60 capsule  1  . itraconazole (SPORANOX) 100 MG capsule Take 200 mg by mouth daily.      . RABEprazole (ACIPHEX) 20 MG tablet Take 1 tablet (20 mg total) by mouth  daily.  30 tablet  1    Allergies as of 05/02/2011 - Review Complete 05/02/2011  Allergen Reaction Noted  . Ibuprofen  08/10/2008    No family history on file.  History   Social History  . Marital Status: Single    Spouse Name: N/A    Number of Children: 3  . Years of Education: N/A   Occupational History  . Retired    Social History Main Topics  . Smoking status: Never Smoker   . Smokeless tobacco: Never Used  . Alcohol Use: No  . Drug Use: No  . Sexually Active: Not on file   Other Topics Concern  . Not on file   Social History Narrative  . No narrative on file      Physical Exam: BP 116/60  Pulse 64  Ht 5\' 4"  (1.626 m)  Wt 179 lb 3.2 oz (81.285 kg)  BMI 30.76 kg/m2  LMP 07/24/1993 Constitutional: generally well-appearing Psychiatric: alert and oriented x3 Abdomen: soft, nontender, nondistended, no obvious ascites, no peritoneal signs, normal bowel sounds     Assessment and plan: 71 y.o. female with  GERD, intermittent dysphasia, change in her bowel habits, intermittent bleeding rectally, history of adenomatous colon polyps  We will proceed with colonoscopy and upper endoscopy at her soonest convenience. She is not taking her proton pump inhibitor  at the correct time in relation to eating and I have recommended she modify that. She will also try fiber supplements.

## 2011-05-08 ENCOUNTER — Encounter: Payer: Self-pay | Admitting: Internal Medicine

## 2011-05-08 ENCOUNTER — Ambulatory Visit (INDEPENDENT_AMBULATORY_CARE_PROVIDER_SITE_OTHER): Payer: Medicare Other | Admitting: Internal Medicine

## 2011-05-08 VITALS — BP 142/69 | HR 78 | Temp 96.7°F | Ht 64.0 in | Wt 180.4 lb

## 2011-05-08 DIAGNOSIS — K219 Gastro-esophageal reflux disease without esophagitis: Secondary | ICD-10-CM

## 2011-05-08 DIAGNOSIS — D649 Anemia, unspecified: Secondary | ICD-10-CM

## 2011-05-08 DIAGNOSIS — Z139 Encounter for screening, unspecified: Secondary | ICD-10-CM

## 2011-05-08 DIAGNOSIS — R1013 Epigastric pain: Secondary | ICD-10-CM

## 2011-05-08 DIAGNOSIS — E559 Vitamin D deficiency, unspecified: Secondary | ICD-10-CM

## 2011-05-08 DIAGNOSIS — E785 Hyperlipidemia, unspecified: Secondary | ICD-10-CM

## 2011-05-08 DIAGNOSIS — I10 Essential (primary) hypertension: Secondary | ICD-10-CM

## 2011-05-08 LAB — COMPREHENSIVE METABOLIC PANEL
ALT: 12 U/L (ref 0–35)
AST: 18 U/L (ref 0–37)
Albumin: 3.8 g/dL (ref 3.5–5.2)
Alkaline Phosphatase: 80 U/L (ref 39–117)
BUN: 21 mg/dL (ref 6–23)
CO2: 21 mEq/L (ref 19–32)
Calcium: 8.5 mg/dL (ref 8.4–10.5)
Chloride: 108 meq/L (ref 96–112)
Creat: 0.95 mg/dL (ref 0.50–1.10)
Glucose, Bld: 99 mg/dL (ref 70–99)
Potassium: 4.2 meq/L (ref 3.5–5.3)
Sodium: 138 meq/L (ref 135–145)
Total Bilirubin: 0.4 mg/dL (ref 0.3–1.2)
Total Protein: 7.8 g/dL (ref 6.0–8.3)

## 2011-05-08 LAB — CBC
HCT: 35.1 % — ABNORMAL LOW (ref 36.0–46.0)
Hemoglobin: 11.2 g/dL — ABNORMAL LOW (ref 12.0–15.0)
MCH: 30.1 pg (ref 26.0–34.0)
MCHC: 31.9 g/dL (ref 30.0–36.0)
MCV: 94.4 fL (ref 78.0–100.0)
Platelets: 217 10*3/uL (ref 150–400)
RBC: 3.72 MIL/uL — ABNORMAL LOW (ref 3.87–5.11)
RDW: 15 % (ref 11.5–15.5)
WBC: 2.7 10*3/uL — ABNORMAL LOW (ref 4.0–10.5)

## 2011-05-08 MED ORDER — HYDROCODONE-ACETAMINOPHEN 5-500 MG PO TABS: 2.0000 | ORAL_TABLET | Freq: Four times a day (QID) | ORAL | Status: DC | PRN

## 2011-05-08 MED ORDER — PROMETHAZINE HCL 12.5 MG PO TABS
12.5000 mg | ORAL_TABLET | Freq: Four times a day (QID) | ORAL | Status: AC | PRN
Start: 2011-05-08 — End: 2011-05-15

## 2011-05-08 NOTE — Assessment & Plan Note (Signed)
Vitals stable, Her symptoms of dizziness are likely related to anemia especially given the history of bright red blood per, scheduled to go for colonoscopy in the next weeks, check CBC today. And prescribed Phenergan for nausea take as needed.

## 2011-05-08 NOTE — Progress Notes (Signed)
Mamm[facility] 05/17/11 12:30PM - pt aware. Stanton Kidney Baker Kogler RN 05/08/11 10:40AM

## 2011-05-08 NOTE — Assessment & Plan Note (Signed)
Patient was treated for this in the past, will check another vitamin D level today.

## 2011-05-08 NOTE — Assessment & Plan Note (Signed)
On diet treatment alone, last LDL 119 which is within her goal of less than 130. Check LDL next December 2013.

## 2011-05-08 NOTE — Assessment & Plan Note (Signed)
Patient's blood pressure slightly above goal, however this could be secondary to pain, no changes to medications today, will reevaluate blood pressure at next visit.

## 2011-05-08 NOTE — Assessment & Plan Note (Signed)
Light because of her symptoms of epigastric pain, going for an upper and lower endoscopy with GI, continue acid suppressant therapy, and prescribed Vicodin to take as needed for pain.

## 2011-05-08 NOTE — Patient Instructions (Signed)

## 2011-05-08 NOTE — Progress Notes (Signed)
Patient ID: Lonnetta Kniskern, female   DOB: 18-Jun-1940, 71 y.o.   MRN: [id number]  HPI:   Patient is a pleasant 71 year old female with a past medical history listed below, presents to the outpatient clinic for two-week followup of her epigastric pain, since that time she has seen gastroenterology who have scheduled her for an upper and lower endoscopy in the coming weeks, as her pain is likely due to gastroduodenal pathology. She reports that her pain is somewhat unchanged from prior visit, is worse when she is laying flat, relieved by antiacids. Also reports ongoing bright red blood per which is chronic for the past several months. No other complaints today   Review of Systems: Negative except per history of present illness  Physical Exam:  Nursing notes and vitals reviewed General:  alert, well-developed, and cooperative to examination.   Lungs:  normal respiratory effort, no accessory muscle use, normal breath sounds, no crackles, and no wheezes. Heart:  normal rate, regular rhythm, no murmurs, no gallop, and no rub.   Abdomen:  soft, non-tender, normal bowel sounds, no distention, no guarding, no rebound tenderness, no hepatomegaly, and no splenomegaly.   Extremities:  No cyanosis, clubbing, edema Neurologic:  alert & oriented X3, nonfocal exam  Meds: Medications Prior to Admission  Medication Sig Dispense Refill  . amLODipine (NORVASC) 5 MG tablet Take 1 tablet (5 mg total) by mouth daily.  30 tablet  11  . aspirin EC 81 MG tablet Take 1 tablet (81 mg total) by mouth daily.  150 tablet  2  . docusate sodium (COLACE) 100 MG capsule Take 1 capsule (100 mg total) by mouth 2 (two) times daily.  60 capsule  1  . itraconazole (SPORANOX) 100 MG capsule Take 200 mg by mouth daily.      . peg 3350 powder (MOVIPREP) 100 G SOLR Take 1 kit (100 g total) by mouth as directed. See written handout  1 kit  0  . RABEprazole (ACIPHEX) 20 MG tablet Take 1 tablet (20 mg total) by mouth daily.  30 tablet  1    No current facility-administered medications on file as of 05/08/2011.    Allergies: Ibuprofen Past Medical History  Diagnosis Date  . Hypertension   . Hyperlipidemia   . Depression with anxiety   . Chest pain   . Benign neoplasm of unspecified site   . Esophageal reflux   . Osteoarthrosis, unspecified whether generalized or localized, unspecified site    No past surgical history on file. No family history on file. History   Social History  . Marital Status: Single    Spouse Name: N/A    Number of Children: 3  . Years of Education: N/A   Occupational History  . Retired    Social History Main Topics  . Smoking status: Never Smoker   . Smokeless tobacco: Never Used  . Alcohol Use: No  . Drug Use: No  . Sexually Active: Not on file   Other Topics Concern  . Not on file   Social History Narrative  . No narrative on file

## 2011-05-08 NOTE — Progress Notes (Signed)
Called pt 11:50AM about mammogram appt - was informed by pt to cancel order for mammogram. Kayhan Boardley RN 05/08/11 11:55AM

## 2011-05-09 LAB — VITAMIN D 25 HYDROXY (VIT D DEFICIENCY, FRACTURES): Vit D, 25-Hydroxy: 28 ng/mL — ABNORMAL LOW (ref 30–89)

## 2011-05-09 MED ORDER — CALCIUM CITRATE-VITAMIN D 315-200 MG-UNIT PO TABS: 2.0000 | ORAL_TABLET | Freq: Two times a day (BID) | ORAL | Status: DC

## 2011-05-09 NOTE — Progress Notes (Signed)
Addended by: Darnelle Maffucci on: 05/09/2011 11:24 AM   Modules accepted: Orders

## 2011-05-17 ENCOUNTER — Ambulatory Visit: Payer: Medicare Other

## 2011-06-05 ENCOUNTER — Telehealth: Payer: Self-pay

## 2011-06-05 NOTE — Telephone Encounter (Signed)
appt for ECL on 06/06/11 moved to 130 pm pt needs to arrive 30 minutes sooner.  Unable to reach pt no voice mail

## 2011-06-06 ENCOUNTER — Encounter: Payer: Self-pay | Admitting: Gastroenterology

## 2011-06-06 ENCOUNTER — Ambulatory Visit (AMBULATORY_SURGERY_CENTER): Payer: Medicare Other | Admitting: Gastroenterology

## 2011-06-06 VITALS — BP 156/84 | HR 61 | Temp 95.6°F | Resp 20 | Ht 64.0 in | Wt 179.0 lb

## 2011-06-06 DIAGNOSIS — Z860100 Personal history of colon polyps, unspecified: Secondary | ICD-10-CM

## 2011-06-06 DIAGNOSIS — D126 Benign neoplasm of colon, unspecified: Secondary | ICD-10-CM

## 2011-06-06 DIAGNOSIS — K219 Gastro-esophageal reflux disease without esophagitis: Secondary | ICD-10-CM

## 2011-06-06 DIAGNOSIS — K625 Hemorrhage of anus and rectum: Secondary | ICD-10-CM

## 2011-06-06 DIAGNOSIS — R198 Other specified symptoms and signs involving the digestive system and abdomen: Secondary | ICD-10-CM

## 2011-06-06 MED ORDER — SODIUM CHLORIDE 0.9 % IV SOLN: 500.0000 mL | INTRAVENOUS | Status: DC

## 2011-06-06 NOTE — Op Note (Signed)
[facility] [address] [place], [place]  [address]  COLONOSCOPY PROCEDURE REPORT  PATIENT:  Brianna Pittman, Brianna Pittman  MR#:  [id number] BIRTHDATE:  16-Aug-1940, 70 yrs. old  GENDER:  female ENDOSCOPIST:  Rachael Fee, MD PROCEDURE DATE:  06/06/2011 PROCEDURE:  Colonoscopy with snare polypectomy ASA CLASS:  Class II INDICATIONS:  adenomatous polyp 2008 MEDICATIONS:   Fentanyl 75 mcg IV, These medications were titrated to patient response per physician's verbal order, Versed 6 mg IV  DESCRIPTION OF PROCEDURE:   After the risks benefits and alternatives of the procedure were thoroughly explained, informed consent was obtained.  Digital rectal exam was performed and revealed no rectal masses.   The LB [id number] endoscope was introduced through the anus and advanced to the cecum, which was identified by both the appendix and ileocecal valve, without limitations.  The quality of the prep was good..  The instrument was then slowly withdrawn as the colon was fully examined. <<PROCEDUREIMAGES>> FINDINGS:  A sessile polyp was found in the ascending colon. This was 4mm across, removed with cold snare and sent to pathology (jar 1) (see image3).  This was otherwise a normal examination of the colon (see image1, image2, and image4).   Retroflexed views in the rectum revealed no abnormalities. COMPLICATIONS:  None  ENDOSCOPIC IMPRESSION: 1) Sessile polyp in the ascending colon, this was removed and sent to pathology 2) Otherwise normal examination  RECOMMENDATIONS: 1) Given your personal history of adenomatous (pre-cancerous) polyps, you will need a repeat colonoscopy in 5 years even if the polyp today is not pre-cancerous. 2) You will receive a letter within 1-2 weeks with the results of your biopsy as well as final recommendations. Please call my office if you have not received a letter after 3 weeks.  ______________________________ Rachael Fee, MD  n. eSIGNED:    Rachael Fee at 06/06/2011 02:12 PM  Blenda Nicely, [phone]

## 2011-06-06 NOTE — Progress Notes (Signed)
Patient did not experience any of the following events: a burn prior to discharge; a fall within the facility; wrong site/side/patient/procedure/implant event; or a hospital transfer or hospital admission upon discharge from the facility. (G8907) Patient did not have preoperative order for IV antibiotic SSI prophylaxis. (G8918)  

## 2011-06-06 NOTE — Patient Instructions (Signed)

## 2011-06-06 NOTE — Telephone Encounter (Signed)
Pt has been notified of appt change.

## 2011-06-06 NOTE — Op Note (Signed)
[facility] [address] [place], [place]  [address]  ENDOSCOPY PROCEDURE REPORT  PATIENT:  Brianna Pittman, Brianna Pittman  MR#:  [id number] BIRTHDATE:  08/10/1940, 70 yrs. old  GENDER:  female ENDOSCOPIST:  Rachael Fee, MD PROCEDURE DATE:  06/06/2011 PROCEDURE:  EGD, diagnostic 43235 ASA CLASS:  Class II INDICATIONS:  dyspepsia MEDICATIONS:  There was residual sedation effect present from prior procedure., These medications were titrated to patient response per physician's verbal order, Versed 2 mg IV TOPICAL ANESTHETIC:  Cetacaine Spray  DESCRIPTION OF PROCEDURE:   After the risks benefits and alternatives of the procedure were thoroughly explained, informed consent was obtained.  The LB [id number] endoscope was introduced through the mouth and advanced to the second portion of the duodenum, without limitations.  The instrument was slowly withdrawn as the mucosa was fully examined. <<PROCEDUREIMAGES>> The upper, middle, and distal third of the esophagus were carefully inspected and no abnormalities were noted. The z-line was well seen at the GEJ. The endoscope was pushed into the fundus which was normal including a retroflexed view. The antrum,gastric body, first and second part of the duodenum were unremarkable (see image1, image2, image3, and image4).    Retroflexed views revealed no abnormalities.    The scope was then withdrawn from the patient and the procedure completed. COMPLICATIONS:  None  ENDOSCOPIC IMPRESSION: 1) Normal EGD  RECOMMENDATIONS: continue your PPI (antiacid medicine) once daily shortly before a meal.  ______________________________ Rachael Fee, MD  n. eSIGNED:   Rachael Fee at 06/06/2011 02:14 PM  Blenda Nicely, [phone]

## 2011-06-07 ENCOUNTER — Telehealth: Payer: Self-pay | Admitting: *Deleted

## 2011-06-07 NOTE — Telephone Encounter (Signed)
  Follow up Call-  Call back number 06/06/2011  Post procedure Call Back phone  # [phone]     Patient questions:  Do you have a fever, pain , or abdominal swelling? no Pain Score  0 *  Have you tolerated food without any problems? yes  Have you been able to return to your normal activities? yes  Do you have any questions about your discharge instructions: Diet   no Medications  no Follow up visit  no  Do you have questions or concerns about your Care? no  Actions: * If pain score is 4 or above: No action needed, pain <4.

## 2011-06-15 ENCOUNTER — Encounter: Payer: Self-pay | Admitting: Gastroenterology

## 2011-06-30 ENCOUNTER — Encounter: Payer: Self-pay | Admitting: Internal Medicine

## 2011-06-30 ENCOUNTER — Ambulatory Visit (INDEPENDENT_AMBULATORY_CARE_PROVIDER_SITE_OTHER): Payer: Medicare Other | Admitting: Internal Medicine

## 2011-06-30 VITALS — BP 142/67 | HR 66 | Temp 97.6°F | Ht 63.0 in | Wt 179.4 lb

## 2011-06-30 DIAGNOSIS — F329 Major depressive disorder, single episode, unspecified: Secondary | ICD-10-CM

## 2011-06-30 DIAGNOSIS — E785 Hyperlipidemia, unspecified: Secondary | ICD-10-CM

## 2011-06-30 DIAGNOSIS — F3289 Other specified depressive episodes: Secondary | ICD-10-CM

## 2011-06-30 DIAGNOSIS — M1990 Unspecified osteoarthritis, unspecified site: Secondary | ICD-10-CM

## 2011-06-30 DIAGNOSIS — I10 Essential (primary) hypertension: Secondary | ICD-10-CM

## 2011-06-30 MED ORDER — TRAMADOL HCL 50 MG PO TABS: 50.0000 mg | ORAL_TABLET | Freq: Four times a day (QID) | ORAL | Status: DC | PRN

## 2011-06-30 MED ORDER — AMLODIPINE BESYLATE 5 MG PO TABS: 5.0000 mg | ORAL_TABLET | Freq: Every day | ORAL | Status: DC

## 2011-06-30 NOTE — Assessment & Plan Note (Signed)
Well controlled on current treatment, No new changes made today, Will continue to monitor. Prescription filled

## 2011-06-30 NOTE — Progress Notes (Signed)
Patient ID: Brianna Pittman, female   DOB: 1941-01-16, 71 y.o.   MRN: [id number] HPI:   Patient is a pleasant 71 year old female with a past medical history listed below, presents to the outpatient clinic for routine followup, she recently underwent colonoscopy which was benign, and recommended repeat in 5 years given family history of adenomatous disease. Today she requires refills on her medications, and several forms to be filled including a handicapped parking form. Reports shoulder pain related to arthritis and would like some medications for this. No other complaints today.  Review of Systems: Negative except per history of present illness  Physical Exam:  Nursing notes and vitals reviewed General:  alert, well-developed, and cooperative to examination.   Lungs:  normal respiratory effort, no accessory muscle use, normal breath sounds, no crackles, and no wheezes. Heart:  normal rate, regular rhythm, no murmurs, no gallop, and no rub.   Abdomen:  soft, non-tender, normal bowel sounds, no distention, no guarding, no rebound tenderness, no hepatomegaly, and no splenomegaly.   Extremities:  No cyanosis, clubbing, edema Neurologic:  alert & oriented X3, nonfocal exam  Meds: Current Outpatient Prescriptions on File Prior to Visit  Medication Sig Dispense Refill  . aspirin EC 81 MG tablet Take 1 tablet (81 mg total) by mouth daily.  150 tablet  2  . calcium citrate-vitamin D (CITRACAL+D) 315-200 MG-UNIT per tablet Take 2 tablets by mouth 2 (two) times daily.  100 tablet  3  . docusate sodium (COLACE) 100 MG capsule Take 1 capsule (100 mg total) by mouth 2 (two) times daily.  60 capsule  1  . HYDROcodone-acetaminophen (VICODIN) 5-500 MG per tablet Take 2 tablets by mouth every 6 (six) hours as needed for pain.  30 tablet  0  . itraconazole (SPORANOX) 100 MG capsule Take 200 mg by mouth daily.      . peg 3350 powder (MOVIPREP) 100 G SOLR Take 1 kit (100 g total) by mouth as directed. See written  handout  1 kit  0  . RABEprazole (ACIPHEX) 20 MG tablet Take 1 tablet (20 mg total) by mouth daily.  30 tablet  1  . DISCONTD: amLODipine (NORVASC) 5 MG tablet Take 1 tablet (5 mg total) by mouth daily.  30 tablet  11    Allergies: Ibuprofen Past Medical History  Diagnosis Date  . Hypertension   . Hyperlipidemia   . Depression with anxiety   . Chest pain   . Benign neoplasm of unspecified site   . Esophageal reflux   . Osteoarthrosis, unspecified whether generalized or localized, unspecified site   . Allergy   . Anemia   . Anxiety   . Blood transfusion   . Cataract   . Ulcer    Past Surgical History  Procedure Date  . Cataracts     both eyes  . Cesarean section   . Abdominal hysterectomy   . Breast reduction   . Tummy tuck    Family History  Problem Relation Age of Onset  . Ovarian cancer Mother   . Colon cancer Neg Hx   . Esophageal cancer Neg Hx   . Rectal cancer Neg Hx   . Stomach cancer Neg Hx    History   Social History  . Marital Status: Single    Spouse Name: N/A    Number of Children: 3  . Years of Education: N/A   Occupational History  . Retired    Social History Main Topics  . Smoking status: Never Smoker   .  Smokeless tobacco: Never Used  . Alcohol Use: No  . Drug Use: No  . Sexually Active: Not on file   Other Topics Concern  . Not on file   Social History Narrative  . No narrative on file

## 2011-06-30 NOTE — Assessment & Plan Note (Signed)
On diet alone, LDL 119 within goal for her which is less than 130, check next LDL in December 2013

## 2011-06-30 NOTE — Patient Instructions (Signed)
--   Please take your medications as prescribed.

## 2011-06-30 NOTE — Assessment & Plan Note (Signed)
Patient is feeling well,, is compliant with her medications, no current suicidal or homicidal ideation.

## 2011-06-30 NOTE — Assessment & Plan Note (Signed)
Patient complains of some increased shoulder pain, recommend Tylenol takes when necessary, we'll give a prescription of tramadol to take as needed for worsening pain.

## 2011-09-12 ENCOUNTER — Encounter: Payer: Self-pay | Admitting: Internal Medicine

## 2011-09-12 ENCOUNTER — Ambulatory Visit (HOSPITAL_COMMUNITY)
Admission: RE | Admit: 2011-09-12 | Discharge: 2011-09-12 | Disposition: A | Discharge status: Home / Self Care | Payer: Medicare Other | Source: Ambulatory Visit | Attending: Internal Medicine | Admitting: Internal Medicine

## 2011-09-12 ENCOUNTER — Inpatient Hospital Stay (HOSPITAL_COMMUNITY): Admission: RE | Admit: 2011-09-12 | Payer: Medicare Other | Source: Ambulatory Visit

## 2011-09-12 ENCOUNTER — Ambulatory Visit (INDEPENDENT_AMBULATORY_CARE_PROVIDER_SITE_OTHER): Payer: Medicare Other | Admitting: Internal Medicine

## 2011-09-12 VITALS — BP 135/71 | HR 74 | Temp 97.1°F | Resp 20 | Ht 64.0 in | Wt 177.3 lb

## 2011-09-12 DIAGNOSIS — I517 Cardiomegaly: Secondary | ICD-10-CM | POA: Insufficient documentation

## 2011-09-12 DIAGNOSIS — R0602 Shortness of breath: Secondary | ICD-10-CM

## 2011-09-12 DIAGNOSIS — K5900 Constipation, unspecified: Secondary | ICD-10-CM

## 2011-09-12 DIAGNOSIS — R1310 Dysphagia, unspecified: Secondary | ICD-10-CM

## 2011-09-12 DIAGNOSIS — B351 Tinea unguium: Secondary | ICD-10-CM

## 2011-09-12 DIAGNOSIS — R351 Nocturia: Secondary | ICD-10-CM

## 2011-09-12 DIAGNOSIS — K219 Gastro-esophageal reflux disease without esophagitis: Secondary | ICD-10-CM

## 2011-09-12 DIAGNOSIS — Z0000 Encounter for general adult medical examination without abnormal findings: Secondary | ICD-10-CM

## 2011-09-12 DIAGNOSIS — R0600 Dyspnea, unspecified: Secondary | ICD-10-CM | POA: Insufficient documentation

## 2011-09-12 DIAGNOSIS — I10 Essential (primary) hypertension: Secondary | ICD-10-CM

## 2011-09-12 DIAGNOSIS — E559 Vitamin D deficiency, unspecified: Secondary | ICD-10-CM

## 2011-09-12 DIAGNOSIS — D649 Anemia, unspecified: Secondary | ICD-10-CM

## 2011-09-12 DIAGNOSIS — I7090 Unspecified atherosclerosis: Secondary | ICD-10-CM | POA: Insufficient documentation

## 2011-09-12 DIAGNOSIS — E785 Hyperlipidemia, unspecified: Secondary | ICD-10-CM

## 2011-09-12 DIAGNOSIS — R0609 Other forms of dyspnea: Secondary | ICD-10-CM

## 2011-09-12 MED ORDER — DOCUSATE SODIUM 100 MG PO CAPS: 100.0000 mg | ORAL_CAPSULE | Freq: Two times a day (BID) | ORAL | Status: DC

## 2011-09-12 MED ORDER — OMEPRAZOLE 40 MG PO CPDR: 40.0000 mg | DELAYED_RELEASE_CAPSULE | Freq: Every day | ORAL | Status: DC

## 2011-09-12 NOTE — Patient Instructions (Addendum)
Please go get your chest X ray today Please get blood work You can increase your Colace to twice a day for constipation  F/u 1 month

## 2011-09-13 ENCOUNTER — Telehealth: Payer: Self-pay | Admitting: Internal Medicine

## 2011-09-13 ENCOUNTER — Encounter: Payer: Self-pay | Admitting: Internal Medicine

## 2011-09-13 LAB — BASIC METABOLIC PANEL WITH GFR
BUN: 14 mg/dL (ref 6–23)
CO2: 25 meq/L (ref 19–32)
Calcium: 8.7 mg/dL (ref 8.4–10.5)
Chloride: 105 meq/L (ref 96–112)
Creat: 0.83 mg/dL (ref 0.50–1.10)
GFR, Est African American: 83 mL/min
GFR, Est Non African American: 72 mL/min
Glucose, Bld: 102 mg/dL — ABNORMAL HIGH (ref 70–99)
Potassium: 4 meq/L (ref 3.5–5.3)
Sodium: 139 mEq/L (ref 135–145)

## 2011-09-13 LAB — URINALYSIS, ROUTINE W REFLEX MICROSCOPIC
Bilirubin Urine: NEGATIVE
Glucose, UA: NEGATIVE mg/dL
Hgb urine dipstick: NEGATIVE
Ketones, ur: NEGATIVE mg/dL
Leukocytes, UA: NEGATIVE
Nitrite: NEGATIVE
Protein, ur: NEGATIVE mg/dL
Specific Gravity, Urine: 1.019 (ref 1.005–1.030)
Urobilinogen, UA: 1 mg/dL (ref 0.0–1.0)
pH: 7 (ref 5.0–8.0)

## 2011-09-13 LAB — CBC WITH DIFFERENTIAL/PLATELET
Basophils Absolute: 0 10*3/uL (ref 0.0–0.1)
Basophils Relative: 0 % (ref 0–1)
Eosinophils Absolute: 0.1 10*3/uL (ref 0.0–0.7)
Eosinophils Relative: 3 % (ref 0–5)
HCT: 35.3 % — ABNORMAL LOW (ref 36.0–46.0)
Hemoglobin: 12 g/dL (ref 12.0–15.0)
Lymphocytes Relative: 50 % — ABNORMAL HIGH (ref 12–46)
Lymphs Abs: 1.5 10*3/uL (ref 0.7–4.0)
MCH: 30.9 pg (ref 26.0–34.0)
MCHC: 34 g/dL (ref 30.0–36.0)
MCV: 91 fL (ref 78.0–100.0)
Monocytes Absolute: 0.2 10*3/uL (ref 0.1–1.0)
Monocytes Relative: 8 % (ref 3–12)
Neutro Abs: 1.1 10*3/uL — ABNORMAL LOW (ref 1.7–7.7)
Neutrophils Relative %: 39 % — ABNORMAL LOW (ref 43–77)
Platelets: 228 10*3/uL (ref 150–400)
RBC: 3.88 MIL/uL (ref 3.87–5.11)
RDW: 15.1 % (ref 11.5–15.5)
WBC: 2.9 10*3/uL — ABNORMAL LOW (ref 4.0–10.5)

## 2011-09-13 LAB — LIPID PANEL
Cholesterol: 288 mg/dL — ABNORMAL HIGH (ref 0–200)
HDL: 54 mg/dL (ref >39)
LDL Cholesterol: 209 mg/dL — ABNORMAL HIGH (ref 0–99)
Total CHOL/HDL Ratio: 5.3 {ratio}
Triglycerides: 125 mg/dL (ref <150)
VLDL: 25 mg/dL (ref 0–40)

## 2011-09-13 LAB — TSH: TSH: 1.372 u[IU]/mL (ref 0.350–4.500)

## 2011-09-13 MED ORDER — SIMVASTATIN 20 MG PO TABS: 20.0000 mg | ORAL_TABLET | Freq: Every evening | ORAL | Status: DC

## 2011-09-13 NOTE — Progress Notes (Signed)
Subjective:    Patient ID: Brianna Pittman, female    DOB: 05-12-1940, 71 y.o.   MRN: [id number]  HPI Comments: 71 y.o woman PMH HTN, HLD, colon polyps, GERD, per pt esophageal spasm, OA, anemia, vit D deficiency.    Pt presents for chronic sob (x 30 years) complaint but sob is worsening on exertion (i.e mowing the lawn, gardening, walking up the stairs) and sometimes at rest.  Pt has a h/o of seeing the pulmonologist and has used inhalers in the past but not currently.  Last pfts were in 2004 (RN Jasmine December checked and they were destroyed since older than 7 years).    Pt also c/o dysphagia being hard to swallow food and water occasionally and voice changes at times. Pt states she is not actively having this problem now.    SH: pt has 3 kids. She is from [place]. Pt likes to garden     Review of Systems  Constitutional: Positive for appetite change. Negative for fever, chills, diaphoresis and fatigue.       Decreased appetite (early satiety)  Eyes: Negative for visual disturbance.  Respiratory: Negative for cough.   Cardiovascular: Positive for leg swelling.       Intermittent cp with exertion, no cp today, no cp at rest  Intermittent lower ext edema (pt assoc leg swelling w/ heat)  Gastrointestinal:       Denies ab pain +constipation no colace qd  Denies rectal bleeding  Genitourinary: Negative for dysuria and hematuria.       Increased freq urination qnight q 2 hrs freq (pt drinking >8 glasses of water qd)  Denies incontinence   Neurological: Negative for headaches.       Objective:   Physical Exam  Nursing note and vitals reviewed. Constitutional: She is oriented to person, place, and time. She appears well-developed and well-nourished. She is cooperative. No distress.       VS nl but pt's HR irregular on physical exam Pleasant, well dressed   HENT:  Head: Normocephalic and atraumatic.  Mouth/Throat: Oropharynx is clear and moist and mucous membranes are normal. She has  dentures.  Eyes: Conjunctivae are normal. Pupils are equal, round, and reactive to light. No scleral icterus.  Cardiovascular: An irregular rhythm present.  Murmur heard.  Systolic murmur is present with a grade of 1/6       Varying HR 50s-70s  Pulmonary/Chest: Effort normal and breath sounds normal. No respiratory distress. She has no wheezes.  Abdominal: Soft. Bowel sounds are normal. There is tenderness in the right lower quadrant and left lower quadrant.       Obese ab  Musculoskeletal:       No lower ext edema  Neurological: She is alert and oriented to person, place, and time.  Skin: Skin is warm, dry and intact. No rash noted. She is not diaphoretic.  Psychiatric: She has a normal mood and affect. Her speech is normal and behavior is normal.          Assessment & Plan:  F/u 1 month worsening sob

## 2011-09-13 NOTE — Assessment & Plan Note (Signed)
Noted b/l great toenails Pt previously taking Sporonax 200 mg (another provider) but stopped d/t pt thinking it was not helpful No desire to tx currently

## 2011-09-13 NOTE — Assessment & Plan Note (Addendum)
Chronic for pt previously on inhalers (i.e albuterol but assoc with palpitations)  Sob on exertion is worsening  On exam no obvious lower ext edema, lung exam wnl  09/2005 left cardiac cath results patent coronary arteries of right coronary dominant system, nl LV sys function (EF 65%), angiographic results with left main with mild calcification but all coronaries were wnl w/o any sig luminal irregularities   CXR 2010 showed ,ild cardiomegaly - no congestive heart failure; no active disease - specifically no evidence for acute or chronic tuberculosis; Left coronary artery calcification or stent.  Stress Echo 2011 showed lv systolic function wnl and LV size decreased   Plan Obtained EKG and compared to old (results=sinus bradycardia, wnl axis) Obtained CXR today Pending CXR results will w/u for cardiac (i.e 2 D echo) or pulm (pfts) Tried to obtain previous pfts ~2004 but records have been destroyed  Obtained cbc w/ diff, tsh, BMP Pending further w/u pt may need to see a pulmonologist in the future

## 2011-09-13 NOTE — Assessment & Plan Note (Signed)
Pt taking vitamin D supplementation from [facility]

## 2011-09-13 NOTE — Telephone Encounter (Signed)
Reviewed lipid panel with Dr. Kem Kays.  Pt has h/o elevated CPK but can be seen in AA pop. Elevation in CPK was before we have record of statin being started.   Spoke with pt. She tolerated Zocor previously and denied muscle weakness then or now.  Reviewed cholesterol results with pt and results of CXR.  Informed pt I will determine further w/u for sob on exertion after speaking with attending and this will be addressed at next appt.  Advised pt sending Rx for Zocor 20 mg qhs to her pharmacy   Shirlee Latch

## 2011-09-13 NOTE — Assessment & Plan Note (Signed)
Lipid Panel     Component Value Date/Time   CHOL 288* 09/12/2011 1630   TRIG 125 09/12/2011 1630   HDL 54 09/12/2011 1630   CHOLHDL 5.3 09/12/2011 1630   VLDL 25 09/12/2011 1630   LDLCALC 209* 09/12/2011 1630   LDL not at goal will call pt in a statin after discussing with attending

## 2011-09-13 NOTE — Progress Notes (Signed)
I saw patient and discussed her care with resident Dr. McLean.  I agree with the clinical findings and plans as outlined in her note. 

## 2011-09-13 NOTE — Assessment & Plan Note (Signed)
Per pt for solids and liq No obvious thrush or lesions in mouth on exam today  Pt had upper endoscopy 05/2011 with was wnl

## 2011-09-13 NOTE — Assessment & Plan Note (Signed)
Not relieved by colace 100 mg qd Advised pt to take Colace 100 mg bid

## 2011-09-13 NOTE — Addendum Note (Signed)
Addended by: Annett Gula on: 09/13/2011 10:05 AM   Modules accepted: Orders

## 2011-09-13 NOTE — Assessment & Plan Note (Signed)
Obtained cbc today will follow

## 2011-09-13 NOTE — Assessment & Plan Note (Signed)
05/2011 colonoscopy noted sessile polyp in ascending colon biopsied showed tubular adenoma no high grade dysplasia or malignancy; Next colonoscopy due 2018 Mammogram 04/2009 neg-pt declines mammogram referral today  pneumoccocal vx-pt declines today

## 2011-09-13 NOTE — Assessment & Plan Note (Signed)
Pt drinking >8 glasses water qd, denies incontinence or dysuria Will obtain UA today

## 2011-09-13 NOTE — Addendum Note (Signed)
Addended by: Annett Gula on: 09/13/2011 05:15 PM   Modules accepted: Orders

## 2011-09-14 ENCOUNTER — Telehealth: Payer: Self-pay | Admitting: *Deleted

## 2011-09-14 NOTE — Telephone Encounter (Signed)
Pt called with appointment for PFT's.  PFT's scheduled for 09/21/2011 10:30 AM to arrive by 10:15 AM in Admitting.  Pt was informed of.  Voiced understanding of.  Angelina Ok, RN 09/14/2011 10;13 AM.

## 2011-09-21 ENCOUNTER — Ambulatory Visit (HOSPITAL_COMMUNITY)
Admission: RE | Admit: 2011-09-21 | Discharge: 2011-09-21 | Disposition: A | Discharge status: Home / Self Care | Payer: Medicare Other | Source: Ambulatory Visit | Attending: Family Medicine | Admitting: Family Medicine

## 2011-09-21 ENCOUNTER — Other Ambulatory Visit: Payer: Self-pay | Admitting: Internal Medicine

## 2011-09-21 ENCOUNTER — Ambulatory Visit (HOSPITAL_COMMUNITY)
Admission: RE | Admit: 2011-09-21 | Discharge: 2011-09-21 | Disposition: A | Discharge status: Home / Self Care | Payer: Medicare Other | Source: Ambulatory Visit | Attending: Internal Medicine | Admitting: Internal Medicine

## 2011-09-21 DIAGNOSIS — R0602 Shortness of breath: Secondary | ICD-10-CM | POA: Insufficient documentation

## 2011-09-21 DIAGNOSIS — R52 Pain, unspecified: Secondary | ICD-10-CM

## 2011-09-21 DIAGNOSIS — M546 Pain in thoracic spine: Secondary | ICD-10-CM | POA: Insufficient documentation

## 2011-09-21 DIAGNOSIS — R079 Chest pain, unspecified: Secondary | ICD-10-CM | POA: Insufficient documentation

## 2011-09-21 MED ORDER — ALBUTEROL SULFATE (5 MG/ML) 0.5% IN NEBU
2.5000 mg | INHALATION_SOLUTION | Freq: Once | RESPIRATORY_TRACT | Status: AC
  Administered 2011-09-21: 2.5 mg via RESPIRATORY_TRACT

## 2011-09-29 ENCOUNTER — Ambulatory Visit (INDEPENDENT_AMBULATORY_CARE_PROVIDER_SITE_OTHER): Payer: Medicare Other | Admitting: Internal Medicine

## 2011-09-29 ENCOUNTER — Encounter: Payer: Self-pay | Admitting: Internal Medicine

## 2011-09-29 VITALS — BP 131/67 | HR 69 | Temp 97.9°F | Ht 64.0 in | Wt 177.7 lb

## 2011-09-29 DIAGNOSIS — R0600 Dyspnea, unspecified: Secondary | ICD-10-CM

## 2011-09-29 DIAGNOSIS — R0609 Other forms of dyspnea: Secondary | ICD-10-CM

## 2011-09-29 DIAGNOSIS — R0989 Other specified symptoms and signs involving the circulatory and respiratory systems: Secondary | ICD-10-CM

## 2011-09-29 MED ORDER — ALBUTEROL SULFATE HFA 108 (90 BASE) MCG/ACT IN AERS: 2.0000 | INHALATION_SPRAY | Freq: Four times a day (QID) | RESPIRATORY_TRACT | Status: DC | PRN

## 2011-09-29 NOTE — Progress Notes (Signed)
Subjective:   Patient ID: Brianna Pittman female   DOB: [birth year] 71 y.o.   MRN: [id number]  HPI: [name] is a 71 y.o. woman with PMH of HTN, HLD, colon polyps with Colonoscopy in 2013, GERD, esophageal spasm with normal EGD in 2013 and vit D deficiency, who presents to the clinic for follow up visit since her last office visit on 09/12/11.   Patient states that she has had chronic intermittent shortness of breath for at least 25 -30 years. She states that she has had SOB with resting or activities. Denies Orthopnea or PND. She reports decreased activity level. However, she does not want to share more details as to her past history and how she has been feeling with me during the interview. She states that she has had four PFTs done in the past with last PFT done in 2004. And she reports that she was told all of her PFTs were Negative. Her shortness of breath has been accompanied with chest discomfort and throat discomfort.  She has had negative stress echo in 2011 and a negative EGD in 2013.    Patient presented with worsening of DOE and decreased activity level at last office visit. Unremarkable BMP and TSH were noted. CBC showed chronic Neutropenia. Lipid Panel was elevated and Zocor was prescribed. Her initial CXR showed mild cardiomegaly but repeated CXR only showed "heart is borderline in size." Her EKG was unremarkable.   She is retired now. She is divorced and has three adult children who do not live with her. She lives by herself. States that she feels depressed at times. Denies SI/HI. She loves gardening and still enjoy growing vegetables. She reports insomnia vs hypersomnia at times due to frequent nighttime urination for past 5 years, but she reports drinking a lot of fluid before going to bed. She reports good appetite and has no significant weight changes (baseline 170-175 lbs). She denies feeling low energy, poor concentration,     Past Medical History  Diagnosis Date  .  Hypertension   . Hyperlipidemia   . Depression with anxiety   . Chest pain   . Benign neoplasm of unspecified site   . Esophageal reflux   . Osteoarthrosis, unspecified whether generalized or localized, unspecified site   . Allergy   . Anemia   . Anxiety   . Blood transfusion   . Cataract   . Ulcer   . Colon polyps   . Vitamin d deficiency   . Depression     h/o depression   Current Outpatient Prescriptions  Medication Sig Dispense Refill  . amLODipine (NORVASC) 5 MG tablet Take 1 tablet (5 mg total) by mouth daily.  30 tablet  11  . calcium citrate-vitamin D (CITRACAL+D) 315-200 MG-UNIT per tablet Take 2 tablets by mouth 2 (two) times daily.  100 tablet  3  . docusate sodium (COLACE) 100 MG capsule Take 1 capsule (100 mg total) by mouth 2 (two) times daily.  60 capsule  11  . omeprazole (PRILOSEC) 40 MG capsule Take 1 capsule (40 mg total) by mouth daily.  30 capsule  2  . simvastatin (ZOCOR) 20 MG tablet Take 1 tablet (20 mg total) by mouth every evening.  30 tablet  11   Family History  Problem Relation Age of Onset  . Ovarian cancer Mother   . Cancer Mother     ?ovacrian ca  . Colon cancer Neg Hx   . Esophageal cancer Neg Hx   . Rectal cancer Neg  Hx   . Stomach cancer Neg Hx   . Diabetes Father   . Diabetes Brother   . Cancer Brother     prostate  . Diabetes Maternal Grandmother   . Diabetes Other   . Diabetes Brother    History   Social History  . Marital Status: Single    Spouse Name: N/A    Number of Children: 3  . Years of Education: N/A   Occupational History  . Retired    Social History Main Topics  . Smoking status: Never Smoker   . Smokeless tobacco: Never Used   Comment: been around 2nd hand smoke  . Alcohol Use: No  . Drug Use: No  . Sexually Active: None   Other Topics Concern  . None   Social History Narrative  . None   Review of Systems: See HPI  Objective:  Physical Exam: Filed Vitals:   09/29/11 1113  BP: 155/78  Pulse: 85   Temp: 97.9 F (36.6 C)  TempSrc: Oral  Height: 5\' 4"  (1.626 m)  Weight: 177 lb 11.2 oz (80.604 kg)  SpO2: 99%   General: alert, well-developed, and cooperative to examination.  Head: normocephalic and atraumatic.  Eyes: vision grossly intact, pupils equal, pupils round, pupils reactive to light, no injection and anicteric.  Mouth: pharynx pink and moist, no erythema, and no exudates.  Neck: supple, full ROM, no thyromegaly, no JVD, and no carotid bruits.  Lungs: normal respiratory effort, no accessory muscle use, normal breath sounds, no crackles, and no wheezes. Heart: normal rate, regular rhythm, no murmur, no gallop, and no rub.  Abdomen: soft, non-tender, normal bowel sounds, no distention, no guarding, no rebound tenderness, no hepatomegaly, and no splenomegaly.  Msk: no joint swelling, no joint warmth, and no redness over joints.  Pulses: 2+ DP/PT pulses bilaterally Extremities: No cyanosis, clubbing, edema Neurologic: alert & oriented X3, cranial nerves II-XII intact, strength normal in all extremities, sensation intact to light touch, and gait normal.  Skin: turgor normal and no rashes.  Psych: Oriented X3, memory intact for recent and remote, normally interactive, good eye contact, not anxious appearing, and not depressed appearing.   Assessment & Plan:

## 2011-09-29 NOTE — Patient Instructions (Addendum)
1. Please follow up in one month with Dr. Lonzo Cloud. 2. Will call you with Pulmonary function test result. 3. Will try Albuterol inhaler.

## 2011-09-29 NOTE — Assessment & Plan Note (Addendum)
Chronic DOE and her PFT pending. Patient states that she has tried Albuterol inhaler in the past which works for her. She would like to try the inhaler while waiting for her PFT report. I was able to obtain the PFT preliminary report. After discussing with Dr. Rogelia Boga, will try Albuterol inhaler.  - will prescribe her with one month supply of Albuterol inhaler. NO REFILLS. - May consider cardiopulmonary rehab or refer her to Pulmonologist pending PFT result.

## 2011-10-09 ENCOUNTER — Ambulatory Visit (HOSPITAL_COMMUNITY)
Admission: RE | Admit: 2011-10-09 | Discharge: 2011-10-09 | Disposition: A | Discharge status: Home / Self Care | Payer: Medicare Other | Source: Ambulatory Visit | Attending: Internal Medicine | Admitting: Internal Medicine

## 2011-10-09 ENCOUNTER — Telehealth: Payer: Self-pay | Admitting: Internal Medicine

## 2011-10-09 ENCOUNTER — Ambulatory Visit (INDEPENDENT_AMBULATORY_CARE_PROVIDER_SITE_OTHER): Payer: Medicare Other | Admitting: Internal Medicine

## 2011-10-09 VITALS — BP 149/64 | HR 63 | Temp 98.1°F | Ht 63.0 in | Wt 175.9 lb

## 2011-10-09 DIAGNOSIS — R0609 Other forms of dyspnea: Secondary | ICD-10-CM

## 2011-10-09 DIAGNOSIS — R079 Chest pain, unspecified: Secondary | ICD-10-CM | POA: Insufficient documentation

## 2011-10-09 DIAGNOSIS — D709 Neutropenia, unspecified: Secondary | ICD-10-CM | POA: Insufficient documentation

## 2011-10-09 DIAGNOSIS — R0600 Dyspnea, unspecified: Secondary | ICD-10-CM

## 2011-10-09 LAB — CBC
HCT: 35.1 % — ABNORMAL LOW (ref 36.0–46.0)
Hemoglobin: 11.8 g/dL — ABNORMAL LOW (ref 12.0–15.0)
MCH: 31.1 pg (ref 26.0–34.0)
MCHC: 33.6 g/dL (ref 30.0–36.0)
MCV: 92.4 fL (ref 78.0–100.0)
Platelets: 205 10*3/uL (ref 150–400)
RBC: 3.8 MIL/uL — ABNORMAL LOW (ref 3.87–5.11)
RDW: 14.3 % (ref 11.5–15.5)
WBC: 3 10*3/uL — ABNORMAL LOW (ref 4.0–10.5)

## 2011-10-09 LAB — BASIC METABOLIC PANEL WITH GFR
BUN: 16 mg/dL (ref 6–23)
CO2: 24 mEq/L (ref 19–32)
Calcium: 8.9 mg/dL (ref 8.4–10.5)
Chloride: 106 mEq/L (ref 96–112)
Creat: 0.85 mg/dL (ref 0.50–1.10)
GFR, Est African American: 80 mL/min
GFR, Est Non African American: 70 mL/min
Glucose, Bld: 99 mg/dL (ref 70–99)
Potassium: 3.6 meq/L (ref 3.5–5.3)
Sodium: 140 meq/L (ref 135–145)

## 2011-10-09 LAB — PRO B NATRIURETIC PEPTIDE: Pro B Natriuretic peptide (BNP): 49.7 pg/mL (ref <126)

## 2011-10-09 MED ORDER — CAPSAICIN-MENTHOL-METHYL SAL 0.025-1-12 % EX CREA: 1.0000 "application " | TOPICAL_CREAM | Freq: Three times a day (TID) | CUTANEOUS | Status: DC

## 2011-10-09 NOTE — Telephone Encounter (Signed)
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  However she doesn't have medical records showing above statement.  I instruct her to have patient call back to talk to my nurse Debra in the morning and it gave her the facility name where patient had sleep study.  We'll request medical records.

## 2011-10-09 NOTE — Assessment & Plan Note (Signed)
Patient was given Advair inhaler to try to see if it would improve her symptom from last office visit. She states that it does not help her.  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 Intake and improvement from bronchodilator.  - will send her for Pulmonologist referral given above PFT results and we do not have clear reasons as to why she has above findings on PFT

## 2011-10-09 NOTE — Patient Instructions (Addendum)
1. Follow up in 3 months 2. Refer to Pulmonologist and hematologist 3. Abdominal ultrasound 4. Use capsaicin cream on your anterior chest wall 3 times daily.

## 2011-10-09 NOTE — Assessment & Plan Note (Addendum)
Patient reports chronic intermittent chest pain for years with negative workup in the past. The characteristic of her chest pain is NOT consistent with ACS.  reproducible anterior chest wall tenderness and mild RUQ tenderness noted on exam  The differential diagnosis include musculoskeletal pain/GERD/pericarditis/aortic dissection/PNA/PE.  She may have costochondritises due to reproducible anterior chest wall tenderness. Her GERD is controlled with PPI.  For aortic dissection>>> the clinical manifestations is not consistent with aortic dissection. Her chest pain lasted for years and recent CXR is unremarkable.  For pericarditis>>> no precipitating factors. Her chest pain is not positional. No Rub noted on exam. EKG is unremarkable.  For pneumonia>>> no respiratory symptoms, lung examination unremarkable, no leukocytosis.  Normal chest x-ray . PNA less likely.  For PE>>> no tachycardia or tachypnea, denies long-distance travel, no s/s of DVT, Geneva score 1 = low probability. Less likely of PE especially with her long history of chest pain for years.   - EKG is unremarkable for ischemic changes. -will give her Capsaicin cream and ibuprofen for her costochondritis  - abd U/S

## 2011-10-09 NOTE — Assessment & Plan Note (Signed)
Chronic neutropenia noted on her lab for years. She does not have recurrent infections. However, given her multiple c/o including SOB, atypical Chest pain, throat discomfort and so on that it is hard to be explained by one disease process, will send her for Hemo/Onco referral

## 2011-10-09 NOTE — Progress Notes (Signed)
Patient ID: Brianna Pittman, female   DOB: 1941/02/17, 71 y.o.   MRN: [id number]  Subjective:   Patient ID: Brianna Pittman female   DOB: 1940-08-10 71 y.o.   MRN: [id number]  HPI: [name] is a 71 y.o. woman with PMH of HTN, HLD, colon polyps with Colonoscopy in 2013, GERD, esophageal spasm with normal EGD in 2013 and vit D deficiency, who presents to the clinic for follow up visit since her last office visit on 09/12/11.   Patient states that she has had chronic intermittent chest pain for " many years." always located on substernal  Area.  Dull pain, 7-8/10, accompanied with heart burn and bilateral fingers tingling at times.( She was started on PPI for GERD and report satisfactory control.) Sometime she would have associated right leg numbness and left foot numbness. She reports her chest pain has happend when she was resting, meals, activity or sleeping. She does not know any specific triggers. She reports that her chest pain usually lasted for 1-2 hours and resolves spontaneously or with "Heating pad".   She has had thorough evaluation in 2007 when she had  negative workup for chest pain including cardiac cath, which showed "Angiographic patent coronary arteries of the right coronary artery. The left ventricular angiogram showed papillary muscle hypertrophy, ejection fraction 65%. No evidence of mitral regurgitation." She was treated for her GERD with PPI and states that " it helps some, but my chest pain never goes away."  She has had negative stress echo in 2011 and a negative EGD in 2013.   Notably, she has had office visits x 2 recently for evaluation of SOB/DOE. Her EKG and CXR was unremarkable.  Past Medical History  Diagnosis Date  . Hypertension   . Hyperlipidemia   . Depression with anxiety   . Chest pain   . Benign neoplasm of unspecified site   . Esophageal reflux   . Osteoarthrosis, unspecified whether generalized or localized, unspecified site   . Allergy   . Anemia     . Anxiety   . Blood transfusion   . Cataract   . Ulcer   . Colon polyps   . Vitamin d deficiency   . Depression     h/o depression   Current Outpatient Prescriptions  Medication Sig Dispense Refill  . albuterol (PROVENTIL HFA;VENTOLIN HFA) 108 (90 BASE) MCG/ACT inhaler Inhale 2 puffs into the lungs every 6 (six) hours as needed for wheezing.  1 Inhaler  0  . amLODipine (NORVASC) 5 MG tablet Take 1 tablet (5 mg total) by mouth daily.  30 tablet  11  . calcium citrate-vitamin D (CITRACAL+D) 315-200 MG-UNIT per tablet Take 2 tablets by mouth 2 (two) times daily.  100 tablet  3  . docusate sodium (COLACE) 100 MG capsule Take 1 capsule (100 mg total) by mouth 2 (two) times daily.  60 capsule  11  . omeprazole (PRILOSEC) 40 MG capsule Take 1 capsule (40 mg total) by mouth daily.  30 capsule  2  . simvastatin (ZOCOR) 20 MG tablet Take 1 tablet (20 mg total) by mouth every evening.  30 tablet  11   Family History  Problem Relation Age of Onset  . Ovarian cancer Mother   . Cancer Mother     ?ovacrian ca  . Colon cancer Neg Hx   . Esophageal cancer Neg Hx   . Rectal cancer Neg Hx   . Stomach cancer Neg Hx   . Diabetes Father   . Diabetes Brother   .  Cancer Brother     prostate  . Diabetes Maternal Grandmother   . Diabetes Other   . Diabetes Brother    History   Social History  . Marital Status: Single    Spouse Name: N/A    Number of Children: 3  . Years of Education: N/A   Occupational History  . Retired    Social History Main Topics  . Smoking status: Never Smoker   . Smokeless tobacco: Never Used   Comment: been around 2nd hand smoke  . Alcohol Use: No  . Drug Use: No  . Sexually Active: Not on file   Other Topics Concern  . Not on file   Social History Narrative  . No narrative on file   Review of Systems: Review of Systems:  Constitutional:  Denies fever, chills, diaphoresis, appetite change and fatigue.   HEENT:  Denies congestion, sore throat, rhinorrhea,  sneezing, mouth sores, trouble swallowing, neck pain   Respiratory:  positive SOB, DOE, deniescough, and wheezing.   Cardiovascular:  Denies palpitations and leg swelling. Positive for chest pain  Gastrointestinal:  Denies nausea, vomiting, abdominal pain, diarrhea, constipation, blood in stool and abdominal distention.   Genitourinary:  Denies dysuria, urgency, frequency, hematuria, flank pain and difficulty urinating.   Musculoskeletal:  Denies myalgias, back pain, joint swelling, arthralgias and gait problem.   Skin:  Denies pallor, rash and wound.   Neurological:  Denies dizziness, seizures, syncope, weakness, light-headedness, numbness and headaches.     Objective:  Physical Exam: Filed Vitals:   10/09/11 1332  BP: 149/64  Pulse: 63  Temp: 98.1 F (36.7 C)  TempSrc: Oral  Height: 5\' 3"  (1.6 m)  Weight: 175 lb 14.4 oz (79.788 kg)  SpO2: 99%   General: alert, well-developed, and cooperative to examination.  Head: normocephalic and atraumatic.  Eyes: vision grossly intact, pupils equal, pupils round, pupils reactive to light, no injection and anicteric.  Mouth: pharynx pink and moist, no erythema, and no exudates.  Neck: supple, full ROM, no thyromegaly, no JVD, and no carotid bruits.  Lungs: normal respiratory effort, no accessory muscle use, normal breath sounds, no crackles, and no wheezes. Heart: normal rate, regular rhythm, no murmur, no gallop, and no rub. reproducible anterior chest wall tenderness. Abdomen: soft, mild tenderness at RUQ. Negative Murphy sign.  normal bowel sounds, no distention, no guarding, no rebound tenderness, no hepatomegaly, and no splenomegaly.  Msk: no joint swelling, no joint warmth, and no redness over joints.  Pulses: 2+ DP/PT pulses bilaterally Extremities: No cyanosis, clubbing, edema Neurologic: alert & oriented X3, cranial nerves II-XII intact, strength normal in all extremities, sensation intact to light touch, and gait normal.  Skin:  turgor normal and no rashes.  Psych: Oriented X3, memory intact for recent and remote, normally interactive, good eye contact, not anxious appearing, and not depressed appearing.   Assessment & Plan:

## 2011-10-11 ENCOUNTER — Telehealth: Payer: Self-pay | Admitting: Hematology and Oncology

## 2011-10-11 NOTE — Telephone Encounter (Signed)
S/W pt re NP appt 8/30 @ 1 w/ Dr. Dalene Carrow. Referring Dr. Dierdre Searles Dx- Neutropenia NP packet mailed out.

## 2011-10-12 ENCOUNTER — Telehealth: Payer: Self-pay | Admitting: Hematology and Oncology

## 2011-10-12 NOTE — Telephone Encounter (Signed)
C/D on 8/22 for Appt on 8/30 °

## 2011-10-13 ENCOUNTER — Other Ambulatory Visit (HOSPITAL_COMMUNITY): Payer: Medicare Other

## 2011-10-13 NOTE — Telephone Encounter (Signed)
Talked with pt - sleep study was done many years ago through private doctor. Pt will go by office to obtain sleep study and bring to clinic.

## 2011-10-20 ENCOUNTER — Ambulatory Visit (HOSPITAL_BASED_OUTPATIENT_CLINIC_OR_DEPARTMENT_OTHER): Payer: Medicare Other

## 2011-10-20 ENCOUNTER — Ambulatory Visit: Payer: Medicare Other

## 2011-10-20 ENCOUNTER — Telehealth: Payer: Self-pay | Admitting: *Deleted

## 2011-10-20 ENCOUNTER — Encounter: Payer: Self-pay | Admitting: Hematology and Oncology

## 2011-10-20 ENCOUNTER — Ambulatory Visit (HOSPITAL_BASED_OUTPATIENT_CLINIC_OR_DEPARTMENT_OTHER): Payer: Medicare Other | Admitting: Hematology and Oncology

## 2011-10-20 VITALS — BP 148/69 | HR 67 | Temp 97.9°F | Resp 20 | Ht 63.0 in | Wt 175.2 lb

## 2011-10-20 DIAGNOSIS — D539 Nutritional anemia, unspecified: Secondary | ICD-10-CM

## 2011-10-20 DIAGNOSIS — D709 Neutropenia, unspecified: Secondary | ICD-10-CM

## 2011-10-20 DIAGNOSIS — D708 Other neutropenia: Secondary | ICD-10-CM

## 2011-10-20 DIAGNOSIS — D649 Anemia, unspecified: Secondary | ICD-10-CM

## 2011-10-20 LAB — CBC WITH DIFFERENTIAL/PLATELET
BASO%: 0.3 % (ref 0.0–2.0)
Basophils Absolute: 0 10*3/uL (ref 0.0–0.1)
EOS%: 2.1 % (ref 0.0–7.0)
Eosinophils Absolute: 0.1 10*3/uL (ref 0.0–0.5)
HCT: 35.4 % (ref 34.8–46.6)
HGB: 11.8 g/dL (ref 11.6–15.9)
LYMPH%: 41.3 % (ref 14.0–49.7)
MCH: 31.9 pg (ref 25.1–34.0)
MCHC: 33.4 g/dL (ref 31.5–36.0)
MCV: 95.4 fL (ref 79.5–101.0)
MONO#: 0.2 10*3/uL (ref 0.1–0.9)
MONO%: 8.6 % (ref 0.0–14.0)
NEUT#: 1.3 10*3/uL — ABNORMAL LOW (ref 1.5–6.5)
NEUT%: 47.7 % (ref 38.4–76.8)
Platelets: 179 10*3/uL (ref 145–400)
RBC: 3.71 10*6/uL (ref 3.70–5.45)
RDW: 15 % — ABNORMAL HIGH (ref 11.2–14.5)
WBC: 2.7 10*3/uL — ABNORMAL LOW (ref 3.9–10.3)
lymph#: 1.1 10*3/uL (ref 0.9–3.3)

## 2011-10-20 LAB — COMPREHENSIVE METABOLIC PANEL (CC13)
ALT: 12 U/L (ref 0–55)
AST: 18 U/L (ref 5–34)
Albumin: 3.5 g/dL (ref 3.5–5.0)
Alkaline Phosphatase: 86 U/L (ref 40–150)
BUN: 18 mg/dL (ref 7.0–26.0)
CO2: 26 mEq/L (ref 22–29)
Calcium: 8.8 mg/dL (ref 8.4–10.4)
Chloride: 106 meq/L (ref 98–107)
Creatinine: 1 mg/dL (ref 0.6–1.1)
Glucose: 127 mg/dl — ABNORMAL HIGH (ref 70–99)
Potassium: 3.9 meq/L (ref 3.5–5.1)
Sodium: 139 mEq/L (ref 136–145)
Total Bilirubin: 0.5 mg/dL (ref 0.20–1.20)
Total Protein: 8 g/dL (ref 6.4–8.3)

## 2011-10-20 NOTE — Progress Notes (Signed)
CC:   Dede Query, MD  IDENTIFYING STATEMENT:  The patient is a 71 year old woman, seen at the request of Dr. Dierdre Searles with neutropenia.  HISTORY OF PRESENT ILLNESS:  The patient reports that she has known that she had mild neutropenia for years.  She also reports that she was told she had mild anemia.  She reports that Dr. Rob Bunting had performed a colonoscopy in March or April of this year that showed benign polyps. She has had no evidence of bleeding.  She denies history of recurrent upper respiratory/skin infections.  I reviewed her records and she was also informative in that she tells me that she was referred to a hematologist at the [facility] in 2002 to be evaluated for neutropenia.  She had received a bone marrow biopsy with aspiration at that time.  I was able to pull her path report.  It was noted that the bone marrow biopsy with aspiration performed on 04/16/2000 had shown adequate granulocytic cells with no increased number of blasts appreciated.  In addition, the erythroid series appeared appropriate. There was plasmacytosis with plasma cells ranging between 13% and 15%, but no atypical forms were shown.  The patient's serum protein electrophoresis had shown a monoclonal IgG lambda protein with an M- spike of 1.26 g/dL.  Her most recent CBC on 10/09/2011 notes a white cell count of 3, hemoglobin 11.8, hematocrit 35.1, platelets 205.  The patient otherwise is still relatively well except for periodic shortness of breath.  Recent pulmonary function tests were abnormal and she has been referred to see Dr. Levy Pupa.  PAST MEDICAL HISTORY: 1. Hypertension. 2. History of colon polyps. 3. GERD. 4. Osteoarthritis. 5. History of anemia. 6. History of hypercholesterolemia.  ALLERGIES:  Ibuprofen.  MEDICATIONS:  Albuterol 2 puffs q.6h p.r.n., Norvasc 5 mg daily, Citracal with D 2 tablets b.i.d., Colace 100 mg b.i.d., Prilosec 40 mg daily, Zocor 20 mg q.h.s.  SOCIAL HISTORY:   The patient is single.  She has 3 children.  She is a retired Lawyer.  Denies alcohol or tobacco use.  FAMILY HISTORY:  The patient's mother had ovarian cancer.  Brother had prostate cancer.  There is no family history for leukemia.  REVIEW OF SYSTEMS:  Denies fever, chills, night sweats, anorexia, weight loss, pain.  GI:  Denies nausea, vomiting, abdominal pain, diarrhea, melena, hematochezia.  Cardiovascular:  Denies chest pain, PND, orthopnea, ankle swelling.  Respirations:  Denies cough.  Has occasional shortness of breath.  Denies hemoptysis or hematemesis.  Neurologic: Denies headaches, vision change, or extremity weakness.  Rest of review of systems negative.  PHYSICAL EXAMINATION:  General:  The patient is alert and oriented x3. Vitals:  Pulse 67, blood pressure 148/69, temperature 97.8, respirations 20, weight 175 pounds.  HEENT:  Head is atraumatic, normocephalic. Sclerae are anicteric.  Extraocular muscles intact.  Pupils equal, round, and reactive to light.  Mouth moist without ulcerations, thrush, or lesions.  Neck:  Supple.  Chest:  Reveals scattered wheezes, otherwise clear.  CVS:  First and second heart sounds present.  No added sounds or murmurs.  Abdomen:  Obese soft, nontender.  No masses.  No hepatosplenomegaly.  Bowel sounds present.  Extremities:  No edema. Pulses present and symmetrical.  Lymph Nodes:  No adenopathy.  CNS: Nonfocal.  IMPRESSION AND PLAN:  Ms. Magdala Brahmbhatt is a 71 year old woman with history of chronic neutropenia and anemia.  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  A bone marrow in 2002 had shown 13% to 15% plasmacytosis.  It would be reasonable for her to proceed with a bone marrow biopsy, which she has agreed to.  In addition, she has also agreed to additional blood work and will have an anemia panel, serum protein electrophoresis with IFE and serum light  chains.  She has also agreed to obtain a skeletal survey.  We did spend some time discussing plasma cell dyscrasia, specifically a monoclonal gammopathy of undetermined significance and its implication and the need for annual blood work, as she has had some concerns about the frequency of blood work she was getting through her primary care physician's office.  She had a number of other questions.  These were all answered.  She follows up in a few weeks' time for bone marrow.    ______________________________ Laurice Record, M.D. LIO/MEDQ  D:  10/20/2011  T:  10/20/2011  Job:  [id number]

## 2011-10-20 NOTE — Progress Notes (Signed)
[facility]    -     Dr.    Dede Query. Dr.    Rob Bunting       -      GI.  [facility]   On   [address]  Daughter     Marcine Matar    Phone     [phone].

## 2011-10-20 NOTE — Progress Notes (Signed)
This office note has been dictated.

## 2011-10-20 NOTE — Patient Instructions (Signed)
Brianna Pittman  [id number]   [facility] - AFTER VISIT SUMMARY   **RECOMMENDATIONS MADE BY THE CONSULTANT AND ANY TEST    RESULTS WILL BE SENT TO YOUR REFERRING DOCTORS.   YOUR EXAM FINDINGS, LABS AND RESULTS WERE DISCUSSED BY YOUR MD TODAY.    Your vital signs are: Filed Vitals:   10/20/11 1326  BP: 148/69  Pulse: 67  Temp: 97.9 F (36.6 C)  Resp: 20    Your Updated drug allergies are: Allergies as of 10/20/2011 - Review Complete 10/09/2011  Allergen Reaction Noted  . Ibuprofen  08/10/2008    Your current list of medications are: Current Outpatient Prescriptions  Medication Sig Dispense Refill  . albuterol (PROVENTIL HFA;VENTOLIN HFA) 108 (90 BASE) MCG/ACT inhaler Inhale 2 puffs into the lungs every 6 (six) hours as needed for wheezing.  1 Inhaler  0  . amLODipine (NORVASC) 5 MG tablet Take 1 tablet (5 mg total) by mouth daily.  30 tablet  11  . calcium citrate-vitamin D (CITRACAL+D) 315-200 MG-UNIT per tablet Take 2 tablets by mouth 2 (two) times daily.  100 tablet  3  . Capsaicin-Menthol-Methyl Sal (CAPSAICIN-METHYL SAL-MENTHOL) 0.025-1-12 % CREA Apply 1 application topically 3 (three) times daily.  1 Tube  1  . docusate sodium (COLACE) 100 MG capsule Take 1 capsule (100 mg total) by mouth 2 (two) times daily.  60 capsule  11  . omeprazole (PRILOSEC) 40 MG capsule Take 1 capsule (40 mg total) by mouth daily.  30 capsule  2  . simvastatin (ZOCOR) 20 MG tablet Take 1 tablet (20 mg total) by mouth every evening.  30 tablet  11     INSTRUCTIONS GIVEN AND DISCUSSED:  See attached schedule   SPECIAL INSTRUCTIONS/FOLLOW-UP:  See above.  I acknowledge that I have been informed and understand all the instructions given to me and received a copy. I do not have any more questions at this time, but understand that I may call the [facility] at [phone] during business hours should I have any further questions or need assistance in obtaining  follow-up care.

## 2011-10-20 NOTE — Telephone Encounter (Signed)
gve the pt her sept 2013 appt calendar along with the bone survey appt

## 2011-10-20 NOTE — Telephone Encounter (Signed)
Pt is scheduled for Bone Marrow  Biopsy  At  0930 am  On  11/07/11.   Both Margaret in pathology lab, and Marcelino Duster, infusion scheduler have been notified.   Verbal and written instructions give to pt.    Pt voiced understanding.

## 2011-10-20 NOTE — Telephone Encounter (Signed)
gve the pt her sept 2013 appt calendar °

## 2011-10-24 ENCOUNTER — Ambulatory Visit (HOSPITAL_COMMUNITY)
Admission: RE | Admit: 2011-10-24 | Discharge: 2011-10-24 | Disposition: A | Discharge status: Home / Self Care | Payer: Medicare Other | Source: Ambulatory Visit | Attending: Hematology and Oncology | Admitting: Hematology and Oncology

## 2011-10-24 DIAGNOSIS — IMO0002 Reserved for concepts with insufficient information to code with codable children: Secondary | ICD-10-CM | POA: Insufficient documentation

## 2011-10-24 DIAGNOSIS — M51379 Other intervertebral disc degeneration, lumbosacral region without mention of lumbar back pain or lower extremity pain: Secondary | ICD-10-CM | POA: Insufficient documentation

## 2011-10-24 DIAGNOSIS — D539 Nutritional anemia, unspecified: Secondary | ICD-10-CM

## 2011-10-24 DIAGNOSIS — D472 Monoclonal gammopathy: Secondary | ICD-10-CM | POA: Insufficient documentation

## 2011-10-24 DIAGNOSIS — D709 Neutropenia, unspecified: Secondary | ICD-10-CM

## 2011-10-24 DIAGNOSIS — M5030 Other cervical disc degeneration, unspecified cervical region: Secondary | ICD-10-CM | POA: Insufficient documentation

## 2011-10-24 DIAGNOSIS — M5137 Other intervertebral disc degeneration, lumbosacral region: Secondary | ICD-10-CM | POA: Insufficient documentation

## 2011-10-27 LAB — KAPPA/LAMBDA LIGHT CHAINS
Kappa free light chain: 1.04 mg/dL (ref 0.33–1.94)
Kappa:Lambda Ratio: 0.33 (ref 0.26–1.65)
Lambda Free Lght Chn: 3.17 mg/dL — ABNORMAL HIGH (ref 0.57–2.63)

## 2011-10-27 LAB — IFE INTERPRETATION

## 2011-10-27 LAB — PROTEIN ELECTROPHORESIS, SERUM
Albumin ELP: 47.3 % — ABNORMAL LOW (ref 55.8–66.1)
Alpha-1-Globulin: 3.9 % (ref 2.9–4.9)
Alpha-2-Globulin: 9 % (ref 7.1–11.8)
Beta 2: 3.3 % (ref 3.2–6.5)
Beta Globulin: 4.5 % — ABNORMAL LOW (ref 4.7–7.2)
Gamma Globulin: 32 % — ABNORMAL HIGH (ref 11.1–18.8)
M-Spike, %: 2.15 g/dL
Total Protein, Serum Electrophoresis: 7.4 g/dL (ref 6.0–8.3)

## 2011-10-27 LAB — VITAMIN B12: Vitamin B-12: 482 pg/mL (ref 211–911)

## 2011-10-27 LAB — FOLATE: Folate: 14 ng/mL

## 2011-10-27 LAB — FERRITIN: Ferritin: 43 ng/mL (ref 10–291)

## 2011-11-03 ENCOUNTER — Institutional Professional Consult (permissible substitution): Payer: Medicare Other | Admitting: Emergency Medicine

## 2011-11-07 ENCOUNTER — Ambulatory Visit: Payer: Medicare Other | Admitting: Hematology and Oncology

## 2011-11-07 ENCOUNTER — Other Ambulatory Visit (HOSPITAL_COMMUNITY)
Admission: RE | Admit: 2011-11-07 | Discharge: 2011-11-07 | Disposition: A | Discharge status: Home / Self Care | Payer: Medicare Other | Source: Ambulatory Visit | Attending: Hematology and Oncology | Admitting: Hematology and Oncology

## 2011-11-07 ENCOUNTER — Other Ambulatory Visit: Payer: Medicare Other | Admitting: Lab

## 2011-11-07 ENCOUNTER — Other Ambulatory Visit: Payer: Self-pay | Admitting: *Deleted

## 2011-11-07 ENCOUNTER — Ambulatory Visit (HOSPITAL_BASED_OUTPATIENT_CLINIC_OR_DEPARTMENT_OTHER): Payer: Medicare Other | Admitting: Hematology and Oncology

## 2011-11-07 VITALS — BP 180/84 | HR 73 | Temp 98.7°F

## 2011-11-07 DIAGNOSIS — D709 Neutropenia, unspecified: Secondary | ICD-10-CM | POA: Insufficient documentation

## 2011-11-07 DIAGNOSIS — C9000 Multiple myeloma not having achieved remission: Secondary | ICD-10-CM

## 2011-11-07 DIAGNOSIS — D649 Anemia, unspecified: Secondary | ICD-10-CM

## 2011-11-07 DIAGNOSIS — D72822 Plasmacytosis: Secondary | ICD-10-CM | POA: Insufficient documentation

## 2011-11-07 LAB — DIFFERENTIAL
Basophils Absolute: 0 10*3/uL (ref 0.0–0.1)
Basophils Relative: 0 % (ref 0–1)
Eosinophils Absolute: 0.1 10*3/uL (ref 0.0–0.7)
Eosinophils Relative: 3 % (ref 0–5)
Lymphocytes Relative: 50 % — ABNORMAL HIGH (ref 12–46)
Lymphs Abs: 0.9 10*3/uL (ref 0.7–4.0)
Monocytes Absolute: 0.2 10*3/uL (ref 0.1–1.0)
Monocytes Relative: 8 % (ref 3–12)
Neutro Abs: 0.8 10*3/uL — ABNORMAL LOW (ref 1.7–7.7)
Neutrophils Relative %: 39 % — ABNORMAL LOW (ref 43–77)

## 2011-11-07 LAB — CBC
HCT: 32 % — ABNORMAL LOW (ref 36.0–46.0)
Hemoglobin: 10.5 g/dL — ABNORMAL LOW (ref 12.0–15.0)
MCH: 30.5 pg (ref 26.0–34.0)
MCHC: 32.8 g/dL (ref 30.0–36.0)
MCV: 93 fL (ref 78.0–100.0)
Platelets: 198 10*3/uL (ref 150–400)
RBC: 3.44 MIL/uL — ABNORMAL LOW (ref 3.87–5.11)
RDW: 14.4 % (ref 11.5–15.5)
WBC: 2 10*3/uL — ABNORMAL LOW (ref 4.0–10.5)

## 2011-11-07 NOTE — Patient Instructions (Addendum)
[facility] Discharge Instructions for Post Bone Marrow Procedure  Today you had a bone marrow biopsy and aspirate of  Right Hip.  Please keep the pressure dressing in place for at least 24 hours.  Have someone check your dressing periodically for bleeding.  If needed you can reapply a pressure dressing to the site.  Take pain medication  Tylenol as needed for discomfort.  IF BLEEDING REOCCURS THAT SHOULD BE REPORTED IMMEDIATELY. Call the Cancer Center at [phone] if during business hours. Or report to the Emergency Room. You can go to [facility] for instructions on how to access My Chart for additional information as needed.   I have been informed and understand all the instructions given to me. I know to contact the clinic, my physician, or go to the Emergency Department if any problems should occur. I do not have any questions at this time, but understand that I may call the clinic during office hours at [phone]  should I have any questions or need assistance in obtaining follow up care.    __________________________________________  _____________  __________ Signature of Patient or Authorized Representative            Date                   Time    __________________________________________ Nurse's Signature

## 2011-11-07 NOTE — Procedures (Signed)
BONE MARROW PROCEDURE NOTE  Following consent, the right skin and iliac crest were cleaned and anesthestized with 2% lidocaine.  A bone marrow and biopsy were performed on separate sites of the iliac crest.  The patient tolerated the procedure well.  The were no complications.  The area was pressure dressed.  The patient was told to keep the area clean and dry for at least 24 hours.  She was to report excessive bleeding. The reason for the bone marrow was to evaluate plasma cell dyscrasia.  The patient follows up to discuss results.

## 2011-11-07 NOTE — Progress Notes (Signed)
Pt. Found in hallway and left .Rt. Bmbx site checked, clean and dry.  Post teaching reviewed with pt. As nurse Normajean Baxter had already discussed with patient. AVS given by Richrd Prime RN. Phone # reviewed.

## 2011-11-08 ENCOUNTER — Telehealth: Payer: Self-pay | Admitting: Hematology and Oncology

## 2011-11-08 ENCOUNTER — Other Ambulatory Visit: Payer: Self-pay | Admitting: *Deleted

## 2011-11-08 NOTE — Telephone Encounter (Signed)
S/w pt re appt for 10/4.

## 2011-11-24 ENCOUNTER — Telehealth: Payer: Self-pay | Admitting: Hematology and Oncology

## 2011-11-24 ENCOUNTER — Ambulatory Visit (HOSPITAL_BASED_OUTPATIENT_CLINIC_OR_DEPARTMENT_OTHER): Payer: Medicare Other | Admitting: Hematology and Oncology

## 2011-11-24 ENCOUNTER — Other Ambulatory Visit (HOSPITAL_BASED_OUTPATIENT_CLINIC_OR_DEPARTMENT_OTHER): Payer: Medicare Other | Admitting: Lab

## 2011-11-24 ENCOUNTER — Encounter: Payer: Self-pay | Admitting: Hematology and Oncology

## 2011-11-24 VITALS — BP 155/76 | HR 73 | Temp 98.7°F | Resp 20 | Ht 63.0 in | Wt 175.8 lb

## 2011-11-24 DIAGNOSIS — D472 Monoclonal gammopathy: Secondary | ICD-10-CM

## 2011-11-24 DIAGNOSIS — D649 Anemia, unspecified: Secondary | ICD-10-CM

## 2011-11-24 DIAGNOSIS — D708 Other neutropenia: Secondary | ICD-10-CM

## 2011-11-24 LAB — CBC WITH DIFFERENTIAL/PLATELET
BASO%: 0.2 % (ref 0.0–2.0)
Basophils Absolute: 0 10*3/uL (ref 0.0–0.1)
EOS%: 1.9 % (ref 0.0–7.0)
Eosinophils Absolute: 0 10*3/uL (ref 0.0–0.5)
HCT: 34.5 % — ABNORMAL LOW (ref 34.8–46.6)
HGB: 11.4 g/dL — ABNORMAL LOW (ref 11.6–15.9)
LYMPH%: 40.5 % (ref 14.0–49.7)
MCH: 31.7 pg (ref 25.1–34.0)
MCHC: 33.1 g/dL (ref 31.5–36.0)
MCV: 95.8 fL (ref 79.5–101.0)
MONO#: 0.2 10*3/uL (ref 0.1–0.9)
MONO%: 8.2 % (ref 0.0–14.0)
NEUT#: 1.3 10*3/uL — ABNORMAL LOW (ref 1.5–6.5)
NEUT%: 49.2 % (ref 38.4–76.8)
Platelets: 170 10*3/uL (ref 145–400)
RBC: 3.61 10*6/uL — ABNORMAL LOW (ref 3.70–5.45)
RDW: 14.9 % — ABNORMAL HIGH (ref 11.2–14.5)
WBC: 2.6 10*3/uL — ABNORMAL LOW (ref 3.9–10.3)
lymph#: 1.1 10*3/uL (ref 0.9–3.3)

## 2011-11-24 NOTE — Progress Notes (Signed)
CC:   Brianna Query, MD  IDENTIFYING STATEMENT:  The patient is a 71 year old woman who presents to discuss results.  INTERVAL HISTORY:  In summary, the patient was referred for neutropenia which she has had for many years.  Mild anemia was also reported and she had a GI evaluation with Dr. Christella Hartigan which was unremarkable.  She also has a past history of having been diagnosed with monoclonal gammopathy of undetermined significance 10 years prior.  Lab results note the following:  On 11/07/2011 white cell count 2, hemoglobin 10.5, hematocrit 32, platelets 198.  ANC 800.  (ANC 1300 and 1100).  BUN and creatinine 18 and 1 respectively; vitamin B12 482; serum folate 14; serum protein electrophoresis showed a monoclonal protein measuring 2.15 g/dL with ISE confirming monoclonal IgG lambda protein. Kappa free light chain 1.04, lambda free light chain was 3.17 and kappa lambda ratio was 0.33, ferritin 43.  Metastatic bone survey showed no bony lesions to indicate multiple myeloma.  A bone marrow biopsy with aspiration on 11/07/2011 revealed a hypercellular bone marrow for age with trilineage hematopoiesis.  The granulocytic precursors appeared only progressive in maturation.  The erythroid precursors also appeared progressive.  Megakaryocytes showed normal morphology and plasma cells represented 8% of total cells.  MEDICATIONS:  Reviewed and updated.  ALLERGIES:  Ibuprofen.  PAST MEDICAL HISTORY/FAMILY HISTORY/SOCIAL HISTORY:  Unchanged.  REVIEW OF SYSTEMS:  Ten point review of systems negative.  PHYSICAL EXAM:  General:  The patient is a well-appearing, well- nourished woman in no distress.  Vitals:  Pulse 73, blood pressure 155/76, temperature 98.7, respirations 20, weight 175.8 pounds.  HEENT: Head is atraumatic, normocephalic.  Sclerae anicteric.  Mouth moist. Neck:  Supple.  Chest:  Clear to percussion and auscultation.  CVS: Unremarkable.  Abdomen:  Soft, nontender.  Bowel sounds  present. Extremities:  No edema.  LABS:  As above.  IMPRESSION AND PLAN:  Brianna Pittman is a 71 year old woman with: 1. Mild chronic neutropenia.  Bone marrow demonstrated adequate white     blood cells.  At this time no treatment is indicated from the     hematology standpoint. 2. IgG gammopathy of undetermined significance.  Skeletal survey     unremarkable.  The patient requires surveillance annually. 3. Mild anemia.  The patient informed to continue with oral iron and     will have followup lab work with Dr. Dierdre Searles. 4. The patient follows up in a year's time.    ______________________________ Brianna Pittman, M.D. LIO/MEDQ  D:  11/24/2011  T:  11/24/2011  Job:  [id number]

## 2011-11-24 NOTE — Telephone Encounter (Signed)
Gave pt appt for next year lab and bone survey September 2014 then md visit after a few days October 2014

## 2011-11-24 NOTE — Progress Notes (Signed)
This office note has been dictated.

## 2011-11-24 NOTE — Patient Instructions (Signed)
Krystel Fletchall  [id number]   [facility] - AFTER VISIT SUMMARY   **RECOMMENDATIONS MADE BY THE CONSULTANT AND ANY TEST    RESULTS WILL BE SENT TO YOUR REFERRING DOCTORS.   YOUR EXAM FINDINGS, LABS AND RESULTS WERE DISCUSSED BY YOUR MD TODAY.  YOU CAN GO TO THE [place] WEB SITE FOR INSTRUCTIONS ON HOW TO ASSESS MY CHART FOR ADDITIONAL INFORMATION AS NEEDED.  Your Updated drug allergies are: Allergies as of 11/24/2011 - Review Complete 11/24/2011  Allergen Reaction Noted  . Ibuprofen  08/10/2008    Your current list of medications are: Current Outpatient Prescriptions  Medication Sig Dispense Refill  . amLODipine (NORVASC) 5 MG tablet Take 1 tablet (5 mg total) by mouth daily.  30 tablet  11  . calcium citrate-vitamin D (CITRACAL+D) 315-200 MG-UNIT per tablet Take 2 tablets by mouth 2 (two) times daily.  100 tablet  3  . docusate sodium (COLACE) 100 MG capsule Take 1 capsule (100 mg total) by mouth 2 (two) times daily.  60 capsule  11  . omeprazole (PRILOSEC) 40 MG capsule Take 1 capsule (40 mg total) by mouth daily.  30 capsule  2  . simvastatin (ZOCOR) 20 MG tablet Take 1 tablet (20 mg total) by mouth every evening.  30 tablet  11  . albuterol (PROVENTIL HFA;VENTOLIN HFA) 108 (90 BASE) MCG/ACT inhaler Inhale 2 puffs into the lungs every 6 (six) hours as needed for wheezing.  1 Inhaler  0  . Capsaicin-Menthol-Methyl Sal (CAPSAICIN-METHYL SAL-MENTHOL) 0.025-1-12 % CREA Apply 1 application topically 3 (three) times daily.  1 Tube  1     INSTRUCTIONS GIVEN AND DISCUSSED:  See attached schedule   SPECIAL INSTRUCTIONS/FOLLOW-UP:  See above.  I acknowledge that I have been informed and understand all the instructions given to me and received a copy.I know to contact the clinic, my physician, or go to the emergency Department if any problems should occur.   I do not have any more questions at this time, but understand that I may call the [facility] at [phone]  [phone] during business hours should I have any further questions or need assistance in obtaining follow-up care.

## 2011-12-04 NOTE — Addendum Note (Signed)
Addended by: Neomia Dear on: 12/04/2011 10:58 AM   Modules accepted: Orders

## 2011-12-18 ENCOUNTER — Ambulatory Visit (INDEPENDENT_AMBULATORY_CARE_PROVIDER_SITE_OTHER): Payer: Medicare Other | Admitting: Internal Medicine

## 2011-12-18 ENCOUNTER — Encounter: Payer: Self-pay | Admitting: Internal Medicine

## 2011-12-18 VITALS — BP 156/76 | HR 67 | Temp 97.2°F | Ht 63.0 in | Wt 178.6 lb

## 2011-12-18 DIAGNOSIS — I10 Essential (primary) hypertension: Secondary | ICD-10-CM

## 2011-12-18 DIAGNOSIS — R0609 Other forms of dyspnea: Secondary | ICD-10-CM

## 2011-12-18 DIAGNOSIS — D239 Other benign neoplasm of skin, unspecified: Secondary | ICD-10-CM

## 2011-12-18 DIAGNOSIS — D472 Monoclonal gammopathy: Secondary | ICD-10-CM

## 2011-12-18 DIAGNOSIS — R0600 Dyspnea, unspecified: Secondary | ICD-10-CM

## 2011-12-18 MED ORDER — AMLODIPINE BESYLATE 10 MG PO TABS: 10.0000 mg | ORAL_TABLET | Freq: Every day | ORAL | Status: DC

## 2011-12-18 NOTE — Assessment & Plan Note (Signed)
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  No further intervention indicated.  Lesion expected to self-resolve -patient encouraged to avoid manipulating the area.

## 2011-12-18 NOTE — Assessment & Plan Note (Signed)
The patient's BP is elevated today on amlodipine 5 monotherapy. -increase amlodipine to 10 mg/day -recheck BP at next visit

## 2011-12-18 NOTE — Assessment & Plan Note (Signed)
-  followed by Dr. Dalene Carrow, appreciate excellent care

## 2011-12-18 NOTE — Patient Instructions (Addendum)
Your blood pressure is elevated today.  We are increasing your Amlodipine medication to 10 mg/day (still 1 tablet per day).  We will send you a referral to Pulmonology to further evaluate your lung function.  Please return for a follow-up visit and blood pressure check in about 3 months.

## 2011-12-18 NOTE — Progress Notes (Signed)
HPI The patient is a 71 y.o. yo female with a history of HTN, HL, MGUS, anemia, depression, presenting for a routine follow-up.  The patient notes a 15-year history of headaches, described as dull, aching, right lateral head pain, 5/10 in severity, lasting 1-2 hours once every few days.  She has been treating the head pain with ice or heat packs, which resolves the pain.  For the last week, she noted a lump on her right scalp which is painful to touch.  No nausea or vomiting, photophobia, phonophobia.    The patient was scheduled for a follow-up with Dr. Delton Coombes for evaluation of abnormal PFT's, showing a mixed obstructive-restrictive pattern, though with no history of smoking or asthma.  Patient accidentally cancelled the appointment, thinking it was for further imaging (citing concern about radiation), but is amenable to rescheduling the appointment.  The patient asks for a letter today to excuse her from an obligation requiring her to travel to the [place].  ROS: General: no fevers, chills, changes in weight, changes in appetite Skin: no rash HEENT: no blurry vision, hearing changes, sore throat Pulm: no dyspnea, coughing, wheezing CV: no palpitations, shortness of breath Abd: no abdominal pain, nausea/vomiting, diarrhea/constipation GU: no dysuria, hematuria, polyuria Ext: no arthralgias, myalgias Neuro: no weakness, numbness, or tingling  Filed Vitals:   12/18/11 1525  BP: 156/76  Pulse: 67  Temp: 97.2 F (36.2 C)    PEX General: alert, cooperative, and in no apparent distress HEENT: PERRL, EOMI, scalp with small 4x3 mm flesh-colored papule Neck: supple, no lymphadenopathy Lungs: clear to ascultation bilaterally, normal work of respiration, no wheezes, rales, ronchi Heart: regular rate and rhythm, no murmurs, gallops, or rubs Abdomen: soft, non-tender, non-distended, normal bowel sounds Extremities: no cyanosis, clubbing, or edema Neurologic: alert & oriented X3, cranial  nerves II-XII intact, strength grossly intact, sensation intact to light touch  Current Outpatient Prescriptions on File Prior to Visit  Medication Sig Dispense Refill  . albuterol (PROVENTIL HFA;VENTOLIN HFA) 108 (90 BASE) MCG/ACT inhaler Inhale 2 puffs into the lungs every 6 (six) hours as needed for wheezing.  1 Inhaler  0  . amLODipine (NORVASC) 5 MG tablet Take 1 tablet (5 mg total) by mouth daily.  30 tablet  11  . calcium citrate-vitamin D (CITRACAL+D) 315-200 MG-UNIT per tablet Take 2 tablets by mouth 2 (two) times daily.  100 tablet  3  . Capsaicin-Menthol-Methyl Sal (CAPSAICIN-METHYL SAL-MENTHOL) 0.025-1-12 % CREA Apply 1 application topically 3 (three) times daily.  1 Tube  1  . docusate sodium (COLACE) 100 MG capsule Take 1 capsule (100 mg total) by mouth 2 (two) times daily.  60 capsule  11  . omeprazole (PRILOSEC) 40 MG capsule Take 1 capsule (40 mg total) by mouth daily.  30 capsule  2  . simvastatin (ZOCOR) 20 MG tablet Take 1 tablet (20 mg total) by mouth every evening.  30 tablet  11    Assessment/Plan

## 2011-12-18 NOTE — Assessment & Plan Note (Signed)
The patient notes a long-standing history of DOE.  PFT's show an FEV1/FVC ratio of 79%, with an FEV1 of 58% predicted.  Patient has never smoked.  LFT's appear to suggest more of a restrictive process, but patient has FEV1/FVC < 80% with no history of asthma, smoking, or prior obstructive lung disease. -will re-refer to pulmonology for evaluation

## 2011-12-26 ENCOUNTER — Other Ambulatory Visit (HOSPITAL_COMMUNITY): Payer: Self-pay | Admitting: Internal Medicine

## 2011-12-26 ENCOUNTER — Telehealth: Payer: Self-pay | Admitting: *Deleted

## 2011-12-26 DIAGNOSIS — R0600 Dyspnea, unspecified: Secondary | ICD-10-CM

## 2011-12-26 NOTE — Telephone Encounter (Signed)
CALLED PATIENT AND LEFT VOICE MESSAGE FOR PATINENT: [facility] 1ST FLOOR ADMISSION OFFICE                                                                                                               11:00AM TO ARRIVE AT 10:45AM / BREATHING TEST (PFT'S) LELA STURDIVANT NT 11-5-013 11:20AM

## 2011-12-27 ENCOUNTER — Encounter: Payer: Self-pay | Admitting: Internal Medicine

## 2011-12-28 ENCOUNTER — Ambulatory Visit (INDEPENDENT_AMBULATORY_CARE_PROVIDER_SITE_OTHER): Payer: Medicare Other | Admitting: Internal Medicine

## 2011-12-28 ENCOUNTER — Encounter: Payer: Self-pay | Admitting: Internal Medicine

## 2011-12-28 VITALS — BP 122/62 | HR 66 | Temp 98.2°F | Ht 63.0 in | Wt 176.0 lb

## 2011-12-28 DIAGNOSIS — R0600 Dyspnea, unspecified: Secondary | ICD-10-CM

## 2011-12-28 DIAGNOSIS — R0609 Other forms of dyspnea: Secondary | ICD-10-CM

## 2011-12-28 MED ORDER — OMEPRAZOLE 40 MG PO CPDR: 40.0000 mg | DELAYED_RELEASE_CAPSULE | Freq: Every day | ORAL | Status: DC

## 2011-12-28 MED ORDER — FAMOTIDINE 20 MG PO TABS: ORAL_TABLET | ORAL | Status: DC

## 2011-12-28 NOTE — Patient Instructions (Addendum)
Omeprazole (prilosec) 40 mg Take 30-60 min before first meal of the day   Pepcid 20 mg one at bedtime  GERD (REFLUX)  is an extremely common cause of respiratory symptoms, many times with no significant heartburn at all.    It can be treated with medication, but also with lifestyle changes including avoidance of late meals, excessive alcohol, smoking cessation, and avoid fatty foods, chocolate, peppermint, colas, red wine, and acidic juices such as orange juice.  NO MINT OR MENTHOL PRODUCTS SO NO COUGH DROPS  USE SUGARLESS CANDY INSTEAD (jolley ranchers or Stover's)  NO OIL BASED VITAMINS - use powdered substitutes.    Please schedule a follow up office visit in 4 weeks, sooner if needed

## 2011-12-28 NOTE — Progress Notes (Signed)
Subjective:    Patient ID: Brianna Pittman, female    DOB: 1940/06/24  MRN: [id number]  HPI  56 yobf never smoker with unexplained doe x "sev decades" referred by [facility] Teaching service 12/28/2011 to pulmonary eval  12/28/2011 1st pulmonary eval cc sob x decades with two patterns; 1) wake sev times a week can't breath, around 4 am, sits up then lie back down worse than usual in terms of severity and frequentcy x sev months 2) doe sev decades worse than usual x 4-5 months, worse with smoke exp, worse x one flight of steps but says can still walk for up to 10 minutes s sob and no ex cp.  Others have heard her "wheezing" with exertion but no better on albuterol. Some dry coughing but not assoc with sob, no overt HB/dysphagia and breathing no worse p meals.  Prev w/u 2004 with neg HRCT chest with contrast  Denies any obvious fluctuation of symptoms with weather or environmental changes or other aggravating or alleviating factors except as outlined above     Review of Systems  Constitutional: Negative for fever and unexpected weight change.  HENT: Negative for ear pain, nosebleeds, congestion, sore throat, rhinorrhea, sneezing, trouble swallowing, dental problem, postnasal drip and sinus pressure.   Eyes: Negative for redness and itching.  Respiratory: Positive for cough, chest tightness, shortness of breath and wheezing.   Cardiovascular: Positive for chest pain. Negative for palpitations and leg swelling.  Gastrointestinal: Negative for nausea and vomiting.  Genitourinary: Negative for dysuria.  Musculoskeletal: Negative for joint swelling.  Skin: Negative for rash.  Neurological: Negative for headaches.  Hematological: Does not bruise/bleed easily.  Psychiatric/Behavioral: Negative for dysphoric mood. The patient is not nervous/anxious.        Objective:   Physical Exam Wt Readings from Last 3 Encounters:  12/28/11 176 lb (79.833 kg)  12/18/11 178 lb 9.6 oz (81.012 kg)  11/24/11 175  lb 12.8 oz (79.742 kg)    Obese amb bf with [place] accent, nad  HEENT: nl dentition, turbinates, and orophanx. Nl external ear canals without cough reflex   NECK :  without JVD/Nodes/TM/ nl carotid upstrokes bilaterally   LUNGS: no acc muscle use, clear to A and P bilaterally without cough on insp or exp maneuvers   CV:  RRR  no s3 or murmur or increase in P2, no edema   ABD:  soft and nontender with nl excursion in the supine position. No bruits or organomegaly, bowel sounds nl  MS:  warm without deformities, calf tenderness, cyanosis or clubbing  SKIN: warm and dry without lesions    NEURO:  alert, approp, no deficits    11/21/11 cxr No acute cardiopulmonary disease.       Assessment & Plan:

## 2011-12-29 NOTE — Assessment & Plan Note (Signed)
-   12/28/2011  Walked RA x 3 laps @ 185 ft each stopped due to  End of study, desat to 86% - Spirometry 09/21/11 pure restriction - BNP << 100 09/2011  This complaint goes back decades (with obesity and deconditioning most likely)  but is worse x sev months ? Why and also assoc with "wheezing" others can here and noct exacerbation despite absence of significant airflow obstruction on pfts'  GERD/ extraesophogeal  is the most likely unifying dx for the recent worsening and rec she rx this aggressively then return in one month and consider methacholine while on gerd rx to r/o primary asthma here.  See instructions for specific recommendations which were reviewed directly with the patient who was given a copy with highlighter outlining the key components.

## 2012-01-02 ENCOUNTER — Encounter (HOSPITAL_COMMUNITY): Payer: Medicare Other

## 2012-01-25 ENCOUNTER — Ambulatory Visit (INDEPENDENT_AMBULATORY_CARE_PROVIDER_SITE_OTHER): Payer: Medicare Other | Admitting: Internal Medicine

## 2012-01-25 ENCOUNTER — Encounter: Payer: Self-pay | Admitting: Internal Medicine

## 2012-01-25 VITALS — BP 122/60 | HR 81 | Temp 98.7°F | Ht 64.0 in | Wt 178.4 lb

## 2012-01-25 DIAGNOSIS — R0600 Dyspnea, unspecified: Secondary | ICD-10-CM

## 2012-01-25 DIAGNOSIS — I10 Essential (primary) hypertension: Secondary | ICD-10-CM

## 2012-01-25 DIAGNOSIS — E785 Hyperlipidemia, unspecified: Secondary | ICD-10-CM

## 2012-01-25 DIAGNOSIS — R0609 Other forms of dyspnea: Secondary | ICD-10-CM

## 2012-01-25 DIAGNOSIS — R0989 Other specified symptoms and signs involving the circulatory and respiratory systems: Secondary | ICD-10-CM

## 2012-01-25 MED ORDER — NEBIVOLOL HCL 5 MG PO TABS: 5.0000 mg | ORAL_TABLET | Freq: Every day | ORAL | Status: DC

## 2012-01-25 NOTE — Assessment & Plan Note (Signed)
Trial off amlodipine 01/25/2012 due to concerns with atypical sob/cp ? gerd related  Try bystolic 5 mg per day samples pending return with full pfts

## 2012-01-25 NOTE — Assessment & Plan Note (Signed)
Trial off zocor due to atypical muscle aches x one month then regroup

## 2012-01-25 NOTE — Assessment & Plan Note (Signed)
--   Spirometry 09/21/11 pure restriction and bnp < 100 09/2011 - 12/28/2011  Walked RA x 3 laps @ 185 ft each stopped due to  End of study, desat to 86% - 01/25/2012  Walked rapidly RA x 3 laps @ 185 ft each stopped due to end of study, no sob or desat  Symptoms go back decades and are atypical and not reproducible here (not clear why she had walking desat previously but did much better today p rx with gerd rx though subjectively feels no different  Rec trial off norvasc and zocor x one month to see to what extent she may be having any adverse drug effects here (also seems unlikely since having similar problems for decades)  Next step if she wants to purse dx is cpst.

## 2012-01-25 NOTE — Patient Instructions (Addendum)
Continue omeprazole 40 mg  30 min before  Breakfast and pepcid (famotidine) 20 mg at bedtime  Stop amlodipine and Zocor (simsvastatin)   Start bystolic 5 mg one daily   Please schedule a follow up office visit in 4 weeks, sooner if needed with pfts on return

## 2012-01-25 NOTE — Progress Notes (Signed)
Subjective:    Patient ID: Brianna Pittman, female    DOB: [birth year]  MRN: [id number]  HPI  45 yobf never smoker with unexplained doe x "sev decades" referred by [facility] Teaching service 12/28/2011 to pulmonary eval  12/28/2011 1st pulmonary eval cc sob x decades with two patterns; 1) wake sev times a week can't breath, around 4 am, sits up then lie back down worse than usual in terms of severity and frequency x sev months 2) doe sev decades worse than usual x 4-5 months, worse with smoke exp, worse x one flight of steps but says can still walk for up to 10 minutes s sob and no ex cp.  Others have heard her "wheezing" with exertion but no better on albuterol. Some dry coughing but not assoc with sob, no overt HB/dysphagia and breathing no worse p meals. Prev w/u 2004 with neg HRCT chest with contrast rec Omeprazole (prilosec) 40 mg Take 30-60 min before first meal of the day  Pepcid 20 mg one at bedtime GERD diet   01/25/2012 f/u ov/Arlana Canizales cc R post chest pain and breathing wake her up sev times a week x decades, sometimes pain s sob, sometimes sob s pain, both better with heating pad.  Also chronic doe >  using bicycle x 15 minutes, stops  due to breathing.  Neither breathing pattern better with saba or with rx for gerd to date while on norvasc and zocor. No pleuritic component to breathing.  Dysphagia is better with gerd rx  No obvious triggers/ daytime variabilty or assoc chronic cough or cp or chest tightness, subjective wheeze overt sinus or hb symptoms. No unusual exp hx or h/o childhood pna/ asthma    Also denies any obvious fluctuation of symptoms with weather or environmental changes or other aggravating or alleviating factors except as outlined above   ROS  The following are not active complaints unless bolded sore throat, dysphagia, dental problems, itching, sneezing,  nasal congestion or excess/ purulent secretions, ear ache,   fever, chills, sweats, unintended wt loss, pleuritic or  exertional cp, hemoptysis,  orthopnea pnd or leg swelling, presyncope, palpitations, heartburn, abdominal pain, anorexia, nausea, vomiting, diarrhea  or change in bowel or urinary habits, change in stools or urine, dysuria,hematuria,  rash, arthralgias, visual complaints, headache, numbness weakness or ataxia or problems with walking or coordination,  change in mood/affect or memory.             Objective:   Physical Exam  01/25/2012 178  Wt Readings from Last 3 Encounters:  12/28/11 176 lb (79.833 kg)  12/18/11 178 lb 9.6 oz (81.012 kg)  11/24/11 175 lb 12.8 oz (79.742 kg)    Obese amb bf with [place] accent, nad  HEENT: nl dentition, turbinates, and orophanx. Nl external ear canals without cough reflex   NECK :  without JVD/Nodes/TM/ nl carotid upstrokes bilaterally   LUNGS: no acc muscle use, clear to A and P bilaterally without cough on insp or exp maneuvers   CV:  RRR  no s3 or murmur or increase in P2, no edema   ABD:  soft and nontender with nl excursion in the supine position. No bruits or organomegaly, bowel sounds nl  MS:  warm without deformities, calf tenderness, cyanosis or clubbing       11/21/11 cxr No acute cardiopulmonary disease.       Assessment & Plan:

## 2012-03-04 ENCOUNTER — Ambulatory Visit (INDEPENDENT_AMBULATORY_CARE_PROVIDER_SITE_OTHER): Payer: Medicare Other | Admitting: Internal Medicine

## 2012-03-04 ENCOUNTER — Encounter: Payer: Self-pay | Admitting: Internal Medicine

## 2012-03-04 VITALS — BP 128/80 | HR 68 | Temp 97.8°F | Ht 64.0 in | Wt 176.0 lb

## 2012-03-04 DIAGNOSIS — R0989 Other specified symptoms and signs involving the circulatory and respiratory systems: Secondary | ICD-10-CM

## 2012-03-04 DIAGNOSIS — R0609 Other forms of dyspnea: Secondary | ICD-10-CM

## 2012-03-04 DIAGNOSIS — R0600 Dyspnea, unspecified: Secondary | ICD-10-CM

## 2012-03-04 DIAGNOSIS — I10 Essential (primary) hypertension: Secondary | ICD-10-CM

## 2012-03-04 LAB — PULMONARY FUNCTION TEST

## 2012-03-04 NOTE — Progress Notes (Signed)
Subjective:    Patient ID: Brianna Pittman, female    DOB: [birth year]  MRN: [id number]    Brief patient profile:  82 yobf never smoker with unexplained doe x "sev decades" referred by [facility] Teaching service 12/28/2011 to pulmonary eval with nl pft's 03/04/2012 x low erv and no symptoms reproducible in office.  HPI 12/28/2011 1st pulmonary eval cc sob x decades with two patterns; 1) wake sev times a week can't breath, around 4 am, sits up then lie back down worse than usual in terms of severity and frequency x sev months 2) doe sev decades worse than usual x 4-5 months, worse with smoke exp, worse x one flight of steps but says can still walk for up to 10 minutes s sob and no ex cp.  Others have heard her "wheezing" with exertion but no better on albuterol. Some dry coughing but not assoc with sob, no overt HB/dysphagia and breathing no worse p meals. Prev w/u 2004 with neg HRCT chest with contrast rec Omeprazole (prilosec) 40 mg Take 30-60 min before first meal of the day  Pepcid 20 mg one at bedtime GERD diet   01/25/2012 f/u ov/Koi Zangara cc R post chest pain and breathing wake her up sev times a week x decades, sometimes pain s sob, sometimes sob s pain, both better with heating pad.  Also chronic doe >  using bicycle x 15 minutes, stops  due to breathing.  Neither breathing pattern better with saba or with rx for gerd to date while on norvasc and zocor. No pleuritic component to breathing.  Dysphagia is better with gerd rx rec Continue omeprazole 40 mg  30 min before  Breakfast and pepcid (famotidine) 20 mg at bedtime Stop amlodipine and Zocor (simsvastatin)  Start bystolic 5 mg one daily  03/04/2012 f/u ov/Daxtin Leiker cc much better p above changes though not clear what meds she's using, no longer taking saba but would like rx anyway.  No obvious triggers/ daytime variabilty or assoc chronic cough or cp or chest tightness, subjective wheeze overt sinus or hb symptoms. No unusual exp hx or h/o childhood  pna/ asthma    Also denies any obvious fluctuation of symptoms with weather or environmental changes or other aggravating or alleviating factors except as outlined above   ROS  The following are not active complaints unless bolded sore throat, dysphagia, dental problems, itching, sneezing,  nasal congestion or excess/ purulent secretions, ear ache,   fever, chills, sweats, unintended wt loss, pleuritic or exertional cp, hemoptysis,  orthopnea pnd or leg swelling, presyncope, palpitations, heartburn, abdominal pain, anorexia, nausea, vomiting, diarrhea  or change in bowel or urinary habits, change in stools or urine, dysuria,hematuria,  rash, arthralgias, visual complaints, headache, numbness weakness or ataxia or problems with walking or coordination,  change in mood/affect or memory.             Objective:   Physical Exam  01/25/2012 178 > 176 03/04/2012  Wt Readings from Last 3 Encounters:  12/28/11 176 lb (79.833 kg)  12/18/11 178 lb 9.6 oz (81.012 kg)  11/24/11 175 lb 12.8 oz (79.742 kg)    Obese amb bf with [place] accent, nad  HEENT: nl dentition, turbinates, and orophanx. Nl external ear canals without cough reflex   NECK :  without JVD/Nodes/TM/ nl carotid upstrokes bilaterally   LUNGS: no acc muscle use, clear to A and P bilaterally without cough on insp or exp maneuvers   CV:  RRR  no s3 or murmur or  increase in P2, no edema   ABD:  soft and nontender with nl excursion in the supine position. No bruits or organomegaly, bowel sounds nl  MS:  warm without deformities, calf tenderness, cyanosis or clubbing       11/21/11 cxr No acute cardiopulmonary disease.       Assessment & Plan:

## 2012-03-04 NOTE — Assessment & Plan Note (Signed)
--   Spirometry 09/21/11 pure restriction and bnp < 100 09/2011 - 12/28/2011  Walked RA x 3 laps @ 185 ft each stopped due to  End of study, desat to 86% - 01/25/2012  Walked rapidly RA x 3 laps @ 185 ft each stopped due to end of study, no sob or desat - 03/04/2012 PFT's with VC 55% with no obstruction and DLCO 50 > corrected to 119 and ERV 18%   I had an extended discussion with the patient today lasting 15 to 20 minutes of a 25 minute visit on the following issues:   Her only problem is a low erv explaining her restrictive changes and typical of the effects of obesity on lung function despite the fact that she doesn't look particularly obese.  However, there is no evidence she has a lung problem or needs any kind of lung medication at this point including saba.

## 2012-03-04 NOTE — Progress Notes (Signed)
PFT done today. 

## 2012-03-04 NOTE — Patient Instructions (Signed)
Make an appointment to see your medical with all active medications in hand and continue the bystolic 5 mg one daily.

## 2012-03-04 NOTE — Assessment & Plan Note (Signed)
-  Trial off amlodipine 01/25/2012 due to concerns with atypical sob/cp ? gerd related  Not clear at all she actually the med changes recommended but she's doing great on whatever meds she's on now so asked her to continue these consistently to make sure she's at an improved baseline before making additional changes.   NB Unlike when you get a prescription for eyeglasses, it's not possible to always walk out of this or any medical office with a perfect prescription that is immediately effective  based on any test that we offer here.    On the contrary, it may take several weeks for the full impact of changes recommened today - hopefully you will respond well.  If not, then we'll adjust your medication on your next visit accordingly, knowing more then than we can possibly know now.    At the end of this discussion, she told my nurse Mardella Layman I made her nervous because I yelled at her Brianna Pittman was standing 10 ft away and witnessed this exchange)   And so I rec no further f/u through this office.

## 2012-03-14 ENCOUNTER — Other Ambulatory Visit: Payer: Self-pay | Admitting: Internal Medicine

## 2012-03-14 DIAGNOSIS — R0600 Dyspnea, unspecified: Secondary | ICD-10-CM

## 2012-03-20 ENCOUNTER — Encounter: Payer: Self-pay | Admitting: Internal Medicine

## 2012-03-20 ENCOUNTER — Ambulatory Visit (INDEPENDENT_AMBULATORY_CARE_PROVIDER_SITE_OTHER): Payer: Medicare Other | Admitting: Internal Medicine

## 2012-03-20 VITALS — BP 172/88 | HR 61 | Temp 97.2°F | Ht 64.0 in | Wt 178.4 lb

## 2012-03-20 DIAGNOSIS — E785 Hyperlipidemia, unspecified: Secondary | ICD-10-CM

## 2012-03-20 DIAGNOSIS — I10 Essential (primary) hypertension: Secondary | ICD-10-CM

## 2012-03-20 DIAGNOSIS — K219 Gastro-esophageal reflux disease without esophagitis: Secondary | ICD-10-CM

## 2012-03-20 MED ORDER — NEBIVOLOL HCL 5 MG PO TABS: 5.0000 mg | ORAL_TABLET | Freq: Every day | ORAL | Status: DC

## 2012-03-20 NOTE — Patient Instructions (Signed)

## 2012-03-20 NOTE — Progress Notes (Signed)
HPI  Pt presents to the clinic today to establish care. She has not seen a PCP in the past. She goes to urgent care as needed. She does have some concerns today about her blood pressure. She was seen 1 month ago by pulmonology for SOB. They did an extensive workup and decided to take her off her Norvasc and put her on a trial of bystolic. Since that time, her blood pressure has been elevated. She monitors it daily. It runs 160-190/80-90. She denies headache, dizziness, bleeding or blurred vision. She thinks she may need to go back on her Norvasc. Other than that, she has no concerns today.  Flu: never Tetanus: never Pneumovax: never Mammogram: 2009 LMP: hysterectomy Pap smear: hysterectomy Eye doctor: yearly Dentist: as needed   Past Medical History  Diagnosis Date  . Hypertension   . Hyperlipidemia   . Depression with anxiety   . Chest pain   . Benign neoplasm of unspecified site   . Esophageal reflux   . Osteoarthrosis, unspecified whether generalized or localized, unspecified site   . Allergy   . Anemia   . Anxiety   . Blood transfusion   . Cataract   . Ulcer   . Colon polyps   . Vitamin D deficiency   . Depression     h/o depression    Current Outpatient Prescriptions  Medication Sig Dispense Refill  . albuterol (PROVENTIL HFA;VENTOLIN HFA) 108 (90 BASE) MCG/ACT inhaler Inhale 2 puffs into the lungs every 6 (six) hours as needed for wheezing.  1 Inhaler  0  . amLODipine (NORVASC) 10 MG tablet Take 10 mg by mouth daily.      . famotidine (PEPCID) 20 MG tablet One at bedtime  30 tablet  11  . nebivolol (BYSTOLIC) 5 MG tablet Take 1 tablet (5 mg total) by mouth daily.  30 tablet  0  . simvastatin (ZOCOR) 20 MG tablet Take 20 mg by mouth daily.      . calcium citrate-vitamin D (CITRACAL+D) 315-200 MG-UNIT per tablet Take 2 tablets by mouth 2 (two) times daily.  100 tablet  3  . docusate sodium (COLACE) 100 MG capsule Take 1 capsule (100 mg total) by mouth 2 (two) times  daily.  60 capsule  11  . omeprazole (PRILOSEC) 40 MG capsule Take 1 capsule (40 mg total) by mouth daily.  30 capsule  11    Allergies  Allergen Reactions  . Ibuprofen     REACTION: itching    Family History  Problem Relation Age of Onset  . Ovarian cancer Mother   . Cancer Mother     ?ovacrian ca  . Colon cancer Neg Hx   . Esophageal cancer Neg Hx   . Rectal cancer Neg Hx   . Stomach cancer Neg Hx   . Diabetes Father   . Diabetes Brother   . Cancer Brother     prostate  . Diabetes Maternal Grandmother   . Diabetes Other   . Diabetes Brother     History   Social History  . Marital Status: Single    Spouse Name: N/A    Number of Children: 3  . Years of Education: N/A   Occupational History  . Retired    Social History Main Topics  . Smoking status: Never Smoker   . Smokeless tobacco: Never Used     Comment: been around 2nd hand smoke  . Alcohol Use: No  . Drug Use: No  . Sexually Active: No  Other Topics Concern  . Not on file   Social History Narrative  . No narrative on file    ROS:  Constitutional: Denies fever, malaise, fatigue, headache or abrupt weight changes.  HEENT: Denies eye pain, eye redness, ear pain, ringing in the ears, wax buildup, runny nose, nasal congestion, bloody nose, or sore throat. Respiratory: Denies difficulty breathing, shortness of breath, cough or sputum production.   Cardiovascular: Denies chest pain, chest tightness, palpitations or swelling in the hands or feet.  Gastrointestinal: Denies abdominal pain, bloating, constipation, diarrhea or blood in the stool.  GU: Denies frequency, urgency, pain with urination, blood in urine, odor or discharge. Musculoskeletal: Denies decrease in range of motion, difficulty with gait, muscle pain or joint pain and swelling.  Skin: Denies redness, rashes, lesions or ulcercations.  Neurological: Denies dizziness, difficulty with memory, difficulty with speech or problems with balance and  coordination.   No other specific complaints in a complete review of systems (except as listed in HPI above).  PE:  BP 172/88  Pulse 61  Temp 97.2 F (36.2 C) (Oral)  Ht 5\' 4"  (1.626 m)  Wt 178 lb 6 oz (80.91 kg)  BMI 30.62 kg/m2  SpO2 96%  LMP 07/24/1993 Wt Readings from Last 3 Encounters:  03/20/12 178 lb 6 oz (80.91 kg)  03/04/12 176 lb (79.833 kg)  01/25/12 178 lb 6.4 oz (80.922 kg)    General: Appears ther stated age, well developed, well nourished in NAD. HEENT: Head: normal shape and size; Eyes: sclera white, no icterus, conjunctiva pink, PERRLA and EOMs intact; Ears: Tm's gray and intact, normal light reflex; Nose: mucosa pink and moist, septum midline; Throat/Mouth: Teeth present, mucosa pink and moist, no lesions or ulcerations noted.  Neck: Normal range of motion. Neck supple, trachea midline. No massses, lumps or thyromegaly present.  Cardiovascular: Normal rate and rhythm. S1,S2 noted.  No murmur, rubs or gallops noted. No JVD or BLE edema. No carotid bruits noted. Pulmonary/Chest: Normal effort and positive vesicular breath sounds. No respiratory distress. No wheezes, rales or ronchi noted.  Abdomen: Soft and nontender. Normal bowel sounds, no bruits noted. No distention or masses noted. Liver, spleen and kidneys non palpable. Musculoskeletal: Normal range of motion. No signs of joint swelling. No difficulty with gait.  Neurological: Alert and oriented. Cranial nerves II-XII intact. Coordination normal. +DTRs bilaterally. Psychiatric: Mood and affect normal. Behavior is normal. Judgment and thought content normal.     Assessment and Plan:    RTC in 1 month for blood pressure check

## 2012-03-21 ENCOUNTER — Emergency Department (HOSPITAL_COMMUNITY)
Admission: EM | Admit: 2012-03-21 | Discharge: 2012-03-22 | Disposition: A | Discharge status: Home / Self Care | Payer: Medicare Other | Attending: Emergency Medicine | Admitting: Emergency Medicine

## 2012-03-21 DIAGNOSIS — I10 Essential (primary) hypertension: Secondary | ICD-10-CM | POA: Insufficient documentation

## 2012-03-21 DIAGNOSIS — K219 Gastro-esophageal reflux disease without esophagitis: Secondary | ICD-10-CM | POA: Insufficient documentation

## 2012-03-21 DIAGNOSIS — Z8719 Personal history of other diseases of the digestive system: Secondary | ICD-10-CM | POA: Insufficient documentation

## 2012-03-21 DIAGNOSIS — M25579 Pain in unspecified ankle and joints of unspecified foot: Secondary | ICD-10-CM | POA: Insufficient documentation

## 2012-03-21 DIAGNOSIS — M159 Polyosteoarthritis, unspecified: Secondary | ICD-10-CM | POA: Insufficient documentation

## 2012-03-21 DIAGNOSIS — R42 Dizziness and giddiness: Secondary | ICD-10-CM | POA: Insufficient documentation

## 2012-03-21 DIAGNOSIS — Z862 Personal history of diseases of the blood and blood-forming organs and certain disorders involving the immune mechanism: Secondary | ICD-10-CM | POA: Insufficient documentation

## 2012-03-21 DIAGNOSIS — Z79899 Other long term (current) drug therapy: Secondary | ICD-10-CM | POA: Insufficient documentation

## 2012-03-21 DIAGNOSIS — E785 Hyperlipidemia, unspecified: Secondary | ICD-10-CM | POA: Insufficient documentation

## 2012-03-21 DIAGNOSIS — Z8659 Personal history of other mental and behavioral disorders: Secondary | ICD-10-CM | POA: Insufficient documentation

## 2012-03-21 DIAGNOSIS — N390 Urinary tract infection, site not specified: Secondary | ICD-10-CM | POA: Insufficient documentation

## 2012-03-21 DIAGNOSIS — M542 Cervicalgia: Secondary | ICD-10-CM | POA: Insufficient documentation

## 2012-03-21 LAB — GLUCOSE, CAPILLARY: Glucose-Capillary: 90 mg/dL (ref 70–99)

## 2012-03-21 NOTE — Assessment & Plan Note (Signed)
Continue prilosec

## 2012-03-21 NOTE — Assessment & Plan Note (Signed)
   Erx for Bystolic 5 mg daily Restart your Norvasc 10 mg as well Continue to avoid salt in your diet Continue diet and exercise

## 2012-03-21 NOTE — Assessment & Plan Note (Signed)
Continue Zocor 

## 2012-03-21 NOTE — ED Notes (Signed)
Pt states she had high blood pressure yesterday and went to PMD. Pt had her blood pressure medications changed. Pt states tonight she developed a headache, dizziness, neck and R leg pain. Pt states she is not dizzy now. Pt denies nausea at present. Pt denies trauma to R leg. Pt denies chest pain. Pt ambulatory to exam room with steady gait. Pt a/o x 3. Skin warm, day.

## 2012-03-22 LAB — URINALYSIS, ROUTINE W REFLEX MICROSCOPIC
Bilirubin Urine: NEGATIVE
Glucose, UA: NEGATIVE mg/dL
Hgb urine dipstick: NEGATIVE
Ketones, ur: NEGATIVE mg/dL
Nitrite: NEGATIVE
Protein, ur: NEGATIVE mg/dL
Specific Gravity, Urine: 1.025 (ref 1.005–1.030)
Urobilinogen, UA: 0.2 mg/dL (ref 0.0–1.0)
pH: 6 (ref 5.0–8.0)

## 2012-03-22 LAB — CBC WITH DIFFERENTIAL/PLATELET
Basophils Absolute: 0 10*3/uL (ref 0.0–0.1)
Basophils Relative: 0 % (ref 0–1)
Eosinophils Absolute: 0.1 10*3/uL (ref 0.0–0.7)
Eosinophils Relative: 3 % (ref 0–5)
HCT: 33.8 % — ABNORMAL LOW (ref 36.0–46.0)
Hemoglobin: 11.2 g/dL — ABNORMAL LOW (ref 12.0–15.0)
Lymphocytes Relative: 51 % — ABNORMAL HIGH (ref 12–46)
Lymphs Abs: 1.6 10*3/uL (ref 0.7–4.0)
MCH: 30.9 pg (ref 26.0–34.0)
MCHC: 33.1 g/dL (ref 30.0–36.0)
MCV: 93.4 fL (ref 78.0–100.0)
Monocytes Absolute: 0.2 10*3/uL (ref 0.1–1.0)
Monocytes Relative: 7 % (ref 3–12)
Neutro Abs: 1.2 10*3/uL — ABNORMAL LOW (ref 1.7–7.7)
Neutrophils Relative %: 38 % — ABNORMAL LOW (ref 43–77)
Platelets: 167 10*3/uL (ref 150–400)
RBC: 3.62 MIL/uL — ABNORMAL LOW (ref 3.87–5.11)
RDW: 13.9 % (ref 11.5–15.5)
WBC: 3.2 10*3/uL — ABNORMAL LOW (ref 4.0–10.5)

## 2012-03-22 LAB — BASIC METABOLIC PANEL
BUN: 17 mg/dL (ref 6–23)
CO2: 24 mEq/L (ref 19–32)
Calcium: 8.4 mg/dL (ref 8.4–10.5)
Chloride: 106 mEq/L (ref 96–112)
Creatinine, Ser: 0.83 mg/dL (ref 0.50–1.10)
GFR calc Af Amer: 81 mL/min — ABNORMAL LOW (ref >90)
GFR calc non Af Amer: 70 mL/min — ABNORMAL LOW (ref >90)
Glucose, Bld: 103 mg/dL — ABNORMAL HIGH (ref 70–99)
Potassium: 3.9 meq/L (ref 3.5–5.1)
Sodium: 138 mEq/L (ref 135–145)

## 2012-03-22 LAB — URINE MICROSCOPIC-ADD ON

## 2012-03-22 MED ORDER — SULFAMETHOXAZOLE-TMP DS 800-160 MG PO TABS: 1.0000 | ORAL_TABLET | Freq: Two times a day (BID) | ORAL | Status: DC

## 2012-03-22 MED ORDER — SULFAMETHOXAZOLE-TMP DS 800-160 MG PO TABS
1.0000 | ORAL_TABLET | Freq: Once | ORAL | Status: AC
  Administered 2012-03-22: 1 via ORAL
  Filled 2012-03-22: qty 1

## 2012-03-22 NOTE — ED Notes (Addendum)
Notified RN, Crystal CBG 90.

## 2012-03-22 NOTE — ED Provider Notes (Signed)
History     CSN: [id number]  Arrival date & time 03/21/12  2100   First MD Initiated Contact with Patient 03/21/12 2301      Chief Complaint  Patient presents with  . Headache    (Consider location/radiation/quality/duration/timing/severity/associated sxs/prior treatment) HPI 72 year old female presents to emergency department complaining of dizziness described as feeling off balance, occasional pain in the right side of her neck/shoulder, and right ankle pain. Patient had elevated blood pressures yesterday, was seen by a new primary care Dr., and had by systolic added to her regimen. Tonight around 8:00 she had episode of dizziness. Patient reports pain in ankle and neck have been ongoing for some time. She does not notice any inciting events. Pains come and go. She is not having any pain at present. She reports history of arthritis. She denies any chest pain, no shortness of breath, no visual changes. She denies any recent upper respiratory illnesses. Family in the room report patient seems a little more confused than usual Past Medical History  Diagnosis Date  . Hypertension   . Hyperlipidemia   . Depression with anxiety   . Chest pain   . Benign neoplasm of unspecified site   . Esophageal reflux   . Osteoarthrosis, unspecified whether generalized or localized, unspecified site   . Allergy   . Anemia   . Anxiety   . Blood transfusion   . Cataract   . Ulcer   . Colon polyps   . Vitamin D deficiency   . Depression     h/o depression    Past Surgical History  Procedure Date  . Cataracts     both eyes  . Breast reduction   . Tummy tuck   . Cesarean section     x 4  . Abdominal hysterectomy 1995    Family History  Problem Relation Age of Onset  . Ovarian cancer Mother   . Cancer Mother     ?ovacrian ca  . Colon cancer Neg Hx   . Esophageal cancer Neg Hx   . Rectal cancer Neg Hx   . Stomach cancer Neg Hx   . Diabetes Father   . Diabetes Brother   . Cancer  Brother     prostate  . Diabetes Maternal Grandmother   . Diabetes Other   . Diabetes Brother     History  Substance Use Topics  . Smoking status: Never Smoker   . Smokeless tobacco: Never Used     Comment: been around 2nd hand smoke  . Alcohol Use: No    OB History    Grav Para Term Preterm Abortions TAB SAB Ect Mult Living                  Review of Systems  See History of Present Illness; otherwise all other systems are reviewed and negative Allergies  Citrus; Ibuprofen; and Other  Home Medications   Current Outpatient Rx  Name  Route  Sig  Dispense  Refill  . AMLODIPINE BESYLATE 10 MG PO TABS   Oral   Take 10 mg by mouth daily.         Marland Kitchen DOCUSATE SODIUM 100 MG PO CAPS   Oral   Take 1 capsule (100 mg total) by mouth 2 (two) times daily.   60 capsule   11   . FAMOTIDINE 20 MG PO TABS      One at bedtime   30 tablet   11   . NEBIVOLOL HCL 5  MG PO TABS   Oral   Take 1 tablet (5 mg total) by mouth daily.   30 tablet   0   . SIMVASTATIN 20 MG PO TABS   Oral   Take 20 mg by mouth daily.           BP 155/66  Pulse 59  Temp 99 F (37.2 C) (Oral)  Resp 20  SpO2 100%  LMP 07/24/1993  Physical Exam  Nursing note and vitals reviewed. Constitutional: She is oriented to person, place, and time. She appears well-developed and well-nourished.  HENT:  Head: Normocephalic and atraumatic.  Nose: Nose normal.  Mouth/Throat: Oropharynx is clear and moist.       Trace serous fluid behind TMs bilaterally  Eyes: Conjunctivae normal and EOM are normal. Pupils are equal, round, and reactive to light.  Neck: Normal range of motion. Neck supple. No JVD present. No tracheal deviation present. No thyromegaly present.  Cardiovascular: Normal rate, regular rhythm, normal heart sounds and intact distal pulses.  Exam reveals no gallop and no friction rub.   No murmur heard. Pulmonary/Chest: Effort normal and breath sounds normal. No stridor. No respiratory distress.  She has no wheezes. She has no rales. She exhibits no tenderness.  Abdominal: Soft. Bowel sounds are normal. She exhibits no distension and no mass. There is no tenderness. There is no rebound and no guarding.  Musculoskeletal: Normal range of motion. She exhibits no edema and no tenderness.  Lymphadenopathy:    She has no cervical adenopathy.  Neurological: She is alert and oriented to person, place, and time. She has normal reflexes. No cranial nerve deficit. She exhibits normal muscle tone. Coordination normal.  Skin: Skin is warm and dry. No rash noted. No erythema. No pallor.  Psychiatric: She has a normal mood and affect. Her behavior is normal. Judgment and thought content normal.    ED Course  Procedures (including critical care time)   Labs Reviewed  GLUCOSE, CAPILLARY  CBC WITH DIFFERENTIAL  BASIC METABOLIC PANEL  URINALYSIS, ROUTINE W REFLEX MICROSCOPIC   No results found.   Date: 03/21/2012  Rate: 58  Rhythm: sinus bradycardia  QRS Axis: normal  Intervals: normal  ST/T Wave abnormalities: normal  Conduction Disutrbances:none  Narrative Interpretation:   Old EKG Reviewed: unchanged  1. Urinary tract infection   2. Dizziness       MDM     72 year old female with brief episode of dizziness and occasional pain in right ankle and neck. All symptoms have since resolved. Her neuro exam currently is normal. She does have trace amount of fluid behind her TMs. Patient recently had blood pressure medicine started yesterday, may be side effect of new medication. We'll check baseline labs, urine, especially will be able to go home to followup with her primary care Dr.     Olivia Mackie, MD 03/22/12 [phone]

## 2012-03-22 NOTE — Discharge Instructions (Signed)
Please followup with your doctor for recheck in 3-5 days. Return emergency room for worsening condition or new concerning symptoms. Drink plenty of fluids. Continue taking your regular scheduled medicines.  Dizziness Dizziness is a common problem. It is a feeling of unsteadiness or lightheadedness. You may feel like you are about to faint. Dizziness can lead to injury if you stumble or fall. A person of any age group can suffer from dizziness, but dizziness is more common in older adults. CAUSES  Dizziness can be caused by many different things, including:  Middle ear problems.  Standing for too long.  Infections.  An allergic reaction.  Aging.  An emotional response to something, such as the sight of blood.  Side effects of medicines.  Fatigue.  Problems with circulation or blood pressure.  Excess use of alcohol, medicines, or illegal drug use.  Breathing too fast (hyperventilation).  An arrhythmia or problems with your heart rhythm.  Low red blood cell count (anemia).  Pregnancy.  Vomiting, diarrhea, fever, or other illnesses that cause dehydration.  Diseases or conditions such as Parkinson's disease, high blood pressure (hypertension), diabetes, and thyroid problems.  Exposure to extreme heat. DIAGNOSIS  To find the cause of your dizziness, your caregiver may do a physical exam, lab tests, radiologic imaging scans, or an electrocardiography test (ECG).  TREATMENT  Treatment of dizziness depends on the cause of your symptoms and can vary greatly. HOME CARE INSTRUCTIONS   Drink enough fluids to keep your urine clear or pale yellow. This is especially important in very hot weather. In the elderly, it is also important in cold weather.  If your dizziness is caused by medicines, take them exactly as directed. When taking blood pressure medicines, it is especially important to get up slowly.  Rise slowly from chairs and steady yourself until you feel okay.  In the  morning, first sit up on the side of the bed. When this seems okay, stand slowly while holding onto something until you know your balance is fine.  If you need to stand in one place for a long time, be sure to move your legs often. Tighten and relax the muscles in your legs while standing.  If dizziness continues to be a problem, have someone stay with you for a day or two. Do this until you feel you are well enough to stay alone. Have the person call your caregiver if he or she notices changes in you that are concerning.  Do not drive or use heavy machinery if you feel dizzy.  Do not drink alcohol. SEEK IMMEDIATE MEDICAL CARE IF:   Your dizziness or lightheadedness gets worse.  You feel nauseous or vomit.  You develop problems with talking, walking, weakness, or using your arms, hands, or legs.  You are not thinking clearly or you have difficulty forming sentences. It may take a friend or family member to determine if your thinking is normal.  You develop chest pain, abdominal pain, shortness of breath, or sweating.  Your vision changes.  You notice any bleeding.  You have side effects from medicine that seems to be getting worse rather than better. MAKE SURE YOU:   Understand these instructions.  Will watch your condition.  Will get help right away if you are not doing well or get worse. Document Released: 08/02/2000 Document Revised: 05/01/2011 Document Reviewed: 08/26/2010 [facility] Patient Information 2013 [place], [place]. Urinary Tract Infection Urinary tract infections (UTIs) can develop anywhere along your urinary tract. Your urinary tract is your  body's drainage system for removing wastes and extra water. Your urinary tract includes two kidneys, two ureters, a bladder, and a urethra. Your kidneys are a pair of bean-shaped organs. Each kidney is about the size of your fist. They are located below your ribs, one on each side of your spine. CAUSES Infections are caused by  microbes, which are microscopic organisms, including fungi, viruses, and bacteria. These organisms are so small that they can only be seen through a microscope. Bacteria are the microbes that most commonly cause UTIs. SYMPTOMS  Symptoms of UTIs may vary by age and gender of the patient and by the location of the infection. Symptoms in young women typically include a frequent and intense urge to urinate and a painful, burning feeling in the bladder or urethra during urination. Older women and men are more likely to be tired, shaky, and weak and have muscle aches and abdominal pain. A fever may mean the infection is in your kidneys. Other symptoms of a kidney infection include pain in your back or sides below the ribs, nausea, and vomiting. DIAGNOSIS To diagnose a UTI, your caregiver will ask you about your symptoms. Your caregiver also will ask to provide a urine sample. The urine sample will be tested for bacteria and white blood cells. White blood cells are made by your body to help fight infection. TREATMENT  Typically, UTIs can be treated with medication. Because most UTIs are caused by a bacterial infection, they usually can be treated with the use of antibiotics. The choice of antibiotic and length of treatment depend on your symptoms and the type of bacteria causing your infection. HOME CARE INSTRUCTIONS  If you were prescribed antibiotics, take them exactly as your caregiver instructs you. Finish the medication even if you feel better after you have only taken some of the medication.  Drink enough water and fluids to keep your urine clear or pale yellow.  Avoid caffeine, tea, and carbonated beverages. They tend to irritate your bladder.  Empty your bladder often. Avoid holding urine for long periods of time.  Empty your bladder before and after sexual intercourse.  After a bowel movement, women should cleanse from front to back. Use each tissue only once. SEEK MEDICAL CARE IF:   You have  back pain.  You develop a fever.  Your symptoms do not begin to resolve within 3 days. SEEK IMMEDIATE MEDICAL CARE IF:   You have severe back pain or lower abdominal pain.  You develop chills.  You have nausea or vomiting.  You have continued burning or discomfort with urination. MAKE SURE YOU:   Understand these instructions.  Will watch your condition.  Will get help right away if you are not doing well or get worse. Document Released: 11/16/2004 Document Revised: 08/08/2011 Document Reviewed: 03/17/2011 [facility] Patient Information 2013 [place], [place].

## 2012-03-22 NOTE — ED Notes (Signed)
EKG old and new given to EDP, Otter, MD. 

## 2012-03-25 ENCOUNTER — Encounter: Payer: Self-pay | Admitting: Internal Medicine

## 2012-03-25 ENCOUNTER — Other Ambulatory Visit: Payer: Self-pay | Admitting: *Deleted

## 2012-03-25 DIAGNOSIS — I10 Essential (primary) hypertension: Secondary | ICD-10-CM

## 2012-03-25 MED ORDER — NEBIVOLOL HCL 5 MG PO TABS: 5.0000 mg | ORAL_TABLET | Freq: Every day | ORAL | Status: DC

## 2012-03-25 NOTE — Telephone Encounter (Signed)
Pt needs rx for Bystolic sent to [facility]. Pt was seen at [facility] on 03/20/2012 by NP. Rx sent.

## 2012-04-13 ENCOUNTER — Encounter: Payer: Self-pay | Admitting: Oncology

## 2012-04-13 ENCOUNTER — Telehealth: Payer: Self-pay | Admitting: Oncology

## 2012-04-17 ENCOUNTER — Ambulatory Visit (INDEPENDENT_AMBULATORY_CARE_PROVIDER_SITE_OTHER): Payer: Medicare Other | Admitting: Internal Medicine

## 2012-04-17 ENCOUNTER — Encounter: Payer: Self-pay | Admitting: Internal Medicine

## 2012-04-17 VITALS — BP 126/68 | HR 64 | Temp 97.6°F | Ht 64.0 in | Wt 175.0 lb

## 2012-04-17 DIAGNOSIS — M25511 Pain in right shoulder: Secondary | ICD-10-CM

## 2012-04-17 DIAGNOSIS — I10 Essential (primary) hypertension: Secondary | ICD-10-CM

## 2012-04-17 DIAGNOSIS — M25519 Pain in unspecified shoulder: Secondary | ICD-10-CM

## 2012-04-17 MED ORDER — FAMOTIDINE 20 MG PO TABS: ORAL_TABLET | ORAL | Status: DC

## 2012-04-17 MED ORDER — AMLODIPINE BESYLATE 10 MG PO TABS: 10.0000 mg | ORAL_TABLET | Freq: Every day | ORAL | Status: DC

## 2012-04-17 MED ORDER — OMEPRAZOLE 40 MG PO CPDR: 40.0000 mg | DELAYED_RELEASE_CAPSULE | Freq: Every day | ORAL | Status: DC

## 2012-04-17 MED ORDER — NEBIVOLOL HCL 5 MG PO TABS: 5.0000 mg | ORAL_TABLET | Freq: Every day | ORAL | Status: DC

## 2012-04-17 MED ORDER — TRAMADOL HCL 50 MG PO TABS: 50.0000 mg | ORAL_TABLET | Freq: Three times a day (TID) | ORAL | Status: DC | PRN

## 2012-04-17 MED ORDER — DOCUSATE SODIUM 100 MG PO CAPS: 100.0000 mg | ORAL_CAPSULE | Freq: Two times a day (BID) | ORAL | Status: DC

## 2012-04-17 MED ORDER — SIMVASTATIN 20 MG PO TABS: 20.0000 mg | ORAL_TABLET | Freq: Every day | ORAL | Status: DC

## 2012-04-17 NOTE — Assessment & Plan Note (Signed)
Well controlled on current therapy Will continue current medication therapy Continue diet and exercise
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
## 2012-04-17 NOTE — Patient Instructions (Addendum)
Shoulder Exercises  EXERCISES   RANGE OF MOTION (ROM) AND STRETCHING EXERCISES  These exercises may help you when beginning to rehabilitate your injury. Your symptoms may resolve with or without further involvement from your physician, physical therapist or athletic trainer. While completing these exercises, remember:   · Restoring tissue flexibility helps normal motion to return to the joints. This allows healthier, less painful movement and activity.  · An effective stretch should be held for at least 30 seconds.  · A stretch should never be painful. You should only feel a gentle lengthening or release in the stretched tissue.  ROM - Pendulum  · Bend at the waist so that your right / left arm falls away from your body. Support yourself with your opposite hand on a solid surface, such as a table or a countertop.  · Your right / left arm should be perpendicular to the ground. If it is not perpendicular, you need to lean over farther. Relax the muscles in your right / left arm and shoulder as much as possible.  · Gently sway your hips and trunk so they move your right / left arm without any use of your right / left shoulder muscles.  · Progress your movements so that your right / left arm moves side to side, then forward and backward, and finally, both clockwise and counterclockwise.  · Complete __________ repetitions in each direction. Many people use this exercise to relieve discomfort in their shoulder as well as to gain range of motion.  Repeat __________ times. Complete this exercise __________ times per day.  STRETCH - Flexion, Standing  · Stand with good posture. With an underhand grip on your right / left hand and an overhand grip on the opposite hand, grasp a broomstick or cane so that your hands are a little more than shoulder-width apart.  · Keeping your right / left elbow straight and shoulder muscles relaxed, push the stick with your opposite hand to raise your right / left arm in front of your body and  then overhead. Raise your arm until you feel a stretch in your right / left shoulder, but before you have increased shoulder pain.  · Try to avoid shrugging your right / left shoulder as your arm rises by keeping your shoulder blade tucked down and toward your mid-back spine. Hold __________ seconds.  · Slowly return to the starting position.  Repeat __________ times. Complete this exercise __________ times per day.  STRETCH - Internal Rotation  · Place your right / left hand behind your back, palm-up.  · Throw a towel or belt over your opposite shoulder. Grasp the towel/belt with your right / left hand.  · While keeping an upright posture, gently pull up on the towel/belt until you feel a stretch in the front of your right / left shoulder.  · Avoid shrugging your right / left shoulder as your arm rises by keeping your shoulder blade tucked down and toward your mid-back spine.  · Hold __________. Release the stretch by lowering your opposite hand.  Repeat __________ times. Complete this exercise __________ times per day.  STRETCH - External Rotation and Abduction  · Stagger your stance through a doorframe. It does not matter which foot is forward.  · As instructed by your physician, physical therapist or athletic trainer, place your hands:  · And forearms above your head and on the door frame.  · And forearms at head-height and on the door frame.  · At elbow-height and   on the door frame.  · Keeping your head and chest upright and your stomach muscles tight to prevent over-extending your low-back, slowly shift your weight onto your front foot until you feel a stretch across your chest and/or in the front of your shoulders.  · Hold __________ seconds. Shift your weight to your back foot to release the stretch.  Repeat __________ times. Complete this stretch __________ times per day.   STRENGTHENING EXERCISES   These exercises may help you when beginning to rehabilitate your injury. They may resolve your symptoms with  or without further involvement from your physician, physical therapist or athletic trainer. While completing these exercises, remember:   · Muscles can gain both the endurance and the strength needed for everyday activities through controlled exercises.  · Complete these exercises as instructed by your physician, physical therapist or athletic trainer. Progress the resistance and repetitions only as guided.  · You may experience muscle soreness or fatigue, but the pain or discomfort you are trying to eliminate should never worsen during these exercises. If this pain does worsen, stop and make certain you are following the directions exactly. If the pain is still present after adjustments, discontinue the exercise until you can discuss the trouble with your clinician.  · If advised by your physician, during your recovery, avoid activity or exercises which involve actions that place your right / left hand or elbow above your head or behind your back or head. These positions stress the tissues which are trying to heal.  STRENGTH - Scapular Depression and Adduction  · With good posture, sit on a firm chair. Supported your arms in front of you with pillows, arm rests or a table top. Have your elbows in line with the sides of your body.  · Gently draw your shoulder blades down and toward your mid-back spine. Gradually increase the tension without tensing the muscles along the top of your shoulders and the back of your neck.  · Hold for __________ seconds. Slowly release the tension and relax your muscles completely before completing the next repetition.  · After you have practiced this exercise, remove the arm support and complete it in standing as well as sitting.  Repeat __________ times. Complete this exercise __________ times per day.   STRENGTH - External Rotators  · Secure a rubber exercise band/tubing to a fixed object so that it is at the same height as your right / left elbow when you are standing or sitting on a  firm surface.  · Stand or sit so that the secured exercise band/tubing is at your side that is not injured.  · Bend your elbow 90 degrees. Place a folded towel or small pillow under your right / left arm so that your elbow is a few inches away from your side.  · Keeping the tension on the exercise band/tubing, pull it away from your body, as if pivoting on your elbow. Be sure to keep your body steady so that the movement is only coming from your shoulder rotating.  · Hold __________ seconds. Release the tension in a controlled manner as you return to the starting position.  Repeat __________ times. Complete this exercise __________ times per day.   STRENGTH - Supraspinatus  · Stand or sit with good posture. Grasp a __________ weight or an exercise band/tubing so that your hand is "thumbs-up," like when you shake hands.  · Slowly lift your right / left hand from your thigh into the air, traveling about 30   degrees from straight out at your side. Lift your hand to shoulder height or as far as you can without increasing any shoulder pain. Initially, many people do not lift their hands above shoulder height.  · Avoid shrugging your right / left shoulder as your arm rises by keeping your shoulder blade tucked down and toward your mid-back spine.  · Hold for __________ seconds. Control the descent of your hand as you slowly return to your starting position.  Repeat __________ times. Complete this exercise __________ times per day.   STRENGTH - Shoulder Extensors  · Secure a rubber exercise band/tubing so that it is at the height of your shoulders when you are either standing or sitting on a firm arm-less chair.  · With a thumbs-up grip, grasp an end of the band/tubing in each hand. Straighten your elbows and lift your hands straight in front of you at shoulder height. Step back away from the secured end of band/tubing until it becomes tense.  · Squeezing your shoulder blades together, pull your hands down to the sides of  your thighs. Do not allow your hands to go behind you.  · Hold for __________ seconds. Slowly ease the tension on the band/tubing as you reverse the directions and return to the starting position.  Repeat __________ times. Complete this exercise __________ times per day.   STRENGTH - Scapular Retractors  · Secure a rubber exercise band/tubing so that it is at the height of your shoulders when you are either standing or sitting on a firm arm-less chair.  · With a palm-down grip, grasp an end of the band/tubing in each hand. Straighten your elbows and lift your hands straight in front of you at shoulder height. Step back away from the secured end of band/tubing until it becomes tense.  · Squeezing your shoulder blades together, draw your elbows back as you bend them. Keep your upper arm lifted away from your body throughout the exercise.  · Hold __________ seconds. Slowly ease the tension on the band/tubing as you reverse the directions and return to the starting position.  Repeat __________ times. Complete this exercise __________ times per day.  STRENGTH - Scapular Depressors  · Find a sturdy chair without wheels, such as a from a dining room table.  · Keeping your feet on the floor, lift your bottom from the seat and lock your elbows.  · Keeping your elbows straight, allow gravity to pull your body weight down. Your shoulders will rise toward your ears.  · Raise your body against gravity by drawing your shoulder blades down your back, shortening the distance between your shoulders and ears. Although your feet should always maintain contact with the floor, your feet should progressively support less body weight as you get stronger.  · Hold __________ seconds. In a controlled and slow manner, lower your body weight to begin the next repetition.  Repeat __________ times. Complete this exercise __________ times per day.   Document Released: 12/21/2004 Document Revised: 05/01/2011 Document Reviewed:  05/21/2008  [employer]® Patient Information ©2013 [employer].

## 2012-05-22 ENCOUNTER — Ambulatory Visit (INDEPENDENT_AMBULATORY_CARE_PROVIDER_SITE_OTHER): Payer: Medicare Other | Admitting: Internal Medicine

## 2012-05-22 ENCOUNTER — Other Ambulatory Visit (INDEPENDENT_AMBULATORY_CARE_PROVIDER_SITE_OTHER): Payer: Medicare Other

## 2012-05-22 ENCOUNTER — Encounter: Payer: Self-pay | Admitting: Internal Medicine

## 2012-05-22 ENCOUNTER — Telehealth: Payer: Self-pay | Admitting: *Deleted

## 2012-05-22 VITALS — BP 116/62 | HR 61 | Temp 97.2°F | Ht 64.0 in | Wt 176.0 lb

## 2012-05-22 DIAGNOSIS — K219 Gastro-esophageal reflux disease without esophagitis: Secondary | ICD-10-CM

## 2012-05-22 DIAGNOSIS — E785 Hyperlipidemia, unspecified: Secondary | ICD-10-CM

## 2012-05-22 DIAGNOSIS — S161XXA Strain of muscle, fascia and tendon at neck level, initial encounter: Secondary | ICD-10-CM

## 2012-05-22 DIAGNOSIS — S139XXA Sprain of joints and ligaments of unspecified parts of neck, initial encounter: Secondary | ICD-10-CM

## 2012-05-22 LAB — LIPID PANEL
Cholesterol: 185 mg/dL (ref 0–200)
HDL: 53.9 mg/dL (ref >39.00)
LDL Cholesterol: 112 mg/dL — ABNORMAL HIGH (ref 0–99)
Total CHOL/HDL Ratio: 3
Triglycerides: 96 mg/dL (ref 0.0–149.0)
VLDL: 19.2 mg/dL (ref 0.0–40.0)

## 2012-05-22 MED ORDER — ESOMEPRAZOLE MAGNESIUM 20 MG PO CPDR: 20.0000 mg | DELAYED_RELEASE_CAPSULE | Freq: Every day | ORAL | Status: DC

## 2012-05-22 MED ORDER — METHOCARBAMOL 500 MG PO TABS: 500.0000 mg | ORAL_TABLET | Freq: Three times a day (TID) | ORAL | Status: DC

## 2012-05-22 NOTE — Progress Notes (Signed)
Subjective:    Patient ID: Brianna Pittman, female    DOB: [birth year], 72 y.o.   MRN: [id number]  HPI  Pt presents to the clinic today with c/o neck pain. This has been an ongoing problem for her. The pain is 6/10. It hurts worse when she bends her neck or twists it from side to side. She thinks this may be related to her lifting weights. She lifts weights a couple of times a week. She has not recently increased her weight. She denies having a injury to her neck. She denies numbness and tingling into her arms. Additionally today, she c/o of reflux. She feels like the generic prilosec is not working. She is unsure if it is because she has been on it so long. She does take tums and rolaids on top of the prilosec which does hep. The reflux seems to be worse when she eats tomato based foods. She would like to try a different medication. She would also like her lipid profile done today. She is on simvistatin for hyperlipidemia. Review of Systems      Past Medical History  Diagnosis Date  . Hypertension   . Hyperlipidemia   . Depression with anxiety   . Chest pain   . Benign neoplasm of unspecified site   . Esophageal reflux   . Osteoarthrosis, unspecified whether generalized or localized, unspecified site   . Allergy   . Anemia   . Anxiety   . Blood transfusion   . Cataract   . Ulcer   . Colon polyps   . Vitamin D deficiency   . Depression     h/o depression    Current Outpatient Prescriptions  Medication Sig Dispense Refill  . amLODipine (NORVASC) 10 MG tablet Take 1 tablet (10 mg total) by mouth daily.  90 tablet  3  . docusate sodium (COLACE) 100 MG capsule Take 1 capsule (100 mg total) by mouth 2 (two) times daily.  180 capsule  3  . famotidine (PEPCID) 20 MG tablet One at bedtime  90 tablet  3  . nebivolol (BYSTOLIC) 5 MG tablet Take 1 tablet (5 mg total) by mouth daily.  90 tablet  3  . simvastatin (ZOCOR) 20 MG tablet Take 1 tablet (20 mg total) by mouth daily.  90 tablet  3   . esomeprazole (NEXIUM) 20 MG capsule Take 1 capsule (20 mg total) by mouth daily before breakfast.  90 capsule  2  . methocarbamol (ROBAXIN) 500 MG tablet Take 1 tablet (500 mg total) by mouth 3 (three) times daily.  30 tablet  0   No current facility-administered medications for this visit.    Allergies  Allergen Reactions  . Citrus Other (See Comments)    Upset stomach  . Ibuprofen     REACTION: itching  . Other Other (See Comments)    Coffee:severe acid reflux    Family History  Problem Relation Age of Onset  . Ovarian cancer Mother   . Cancer Mother     ?ovacrian ca  . Colon cancer Neg Hx   . Esophageal cancer Neg Hx   . Rectal cancer Neg Hx   . Stomach cancer Neg Hx   . Diabetes Father   . Diabetes Brother   . Cancer Brother     prostate  . Diabetes Maternal Grandmother   . Diabetes Other   . Diabetes Brother     History   Social History  . Marital Status: Single    Spouse Name:  N/A    Number of Children: 3  . Years of Education: N/A   Occupational History  . Retired    Social History Main Topics  . Smoking status: Never Smoker   . Smokeless tobacco: Never Used     Comment: been around 2nd hand smoke  . Alcohol Use: No  . Drug Use: No  . Sexually Active: No   Other Topics Concern  . Not on file   Social History Narrative  . No narrative on file     Constitutional: Denies fever, malaise, fatigue, headache or abrupt weight changes.  Respiratory: Denies difficulty breathing, shortness of breath, cough or sputum production.   Cardiovascular: Denies chest pain, chest tightness, palpitations or swelling in the hands or feet.  Gastrointestinal: Pt reports reflux. Denies abdominal pain, bloating, constipation, diarrhea or blood in the stool.  Musculoskeletal: Pt reports neck pain. Denies decrease in range of motion, difficulty with gait, or joint pain and swelling. .  Neurological: Denies dizziness, difficulty with memory, difficulty with speech or  problems with balance and coordination.   No other specific complaints in a complete review of systems (except as listed in HPI above).  Objective:   Physical Exam   BP 116/62  Pulse 61  Temp(Src) 97.2 F (36.2 C) (Oral)  Ht 5\' 4"  (1.626 m)  Wt 176 lb (79.833 kg)  BMI 30.2 kg/m2  SpO2 99%  LMP 07/24/1993 Wt Readings from Last 3 Encounters:  05/22/12 176 lb (79.833 kg)  04/17/12 175 lb (79.379 kg)  03/20/12 178 lb 6 oz (80.91 kg)    General: Appears her stated age, well developed, well nourished in NAD. Neck: Decreased range of motion secondary to pain. Neck supple, trachea midline. No massses, lumps or thyromegaly present.  Cardiovascular: Normal rate and rhythm. S1,S2 noted.  No murmur, rubs or gallops noted. No JVD or BLE edema. No carotid bruits noted. Pulmonary/Chest: Normal effort and positive vesicular breath sounds. No respiratory distress. No wheezes, rales or ronchi noted.  Abdomen: Soft and nontender. Normal bowel sounds, no bruits noted. No distention or masses noted. Liver, spleen and kidneys non palpable. Musculoskeletal: Normal range of motion. No signs of joint swelling. No difficulty with gait.  Neurological: Alert and oriented. Cranial nerves II-XII intact. Coordination normal. +DTRs bilaterally.       Assessment & Plan:   Muscle strain of neck, new onset:  Avoid lifting weights for the next 10 days erx for Robaxin TID prn Apply a heating pad to the affected area for 15 minutes BID

## 2012-05-22 NOTE — Assessment & Plan Note (Signed)
Will check lipid profile today Will titrate medication based on lab data

## 2012-05-22 NOTE — Telephone Encounter (Signed)
Methocarbamol is not covered under pt's plan. Formulary alternatives are Tizanidine, Baclofen, or generic oral NSAIDS. If alternatives will not work, PA is required.

## 2012-05-22 NOTE — Patient Instructions (Signed)
Muscle Strain °Muscle strain occurs when a muscle is stretched beyond its normal length. A small number of muscle fibers generally are torn. This is especially common in athletes. This happens when a sudden, violent force placed on a muscle stretches it too far. Usually, recovery from muscle strain takes 1 to 2 weeks. Complete healing will take 5 to 6 weeks.  °HOME CARE INSTRUCTIONS  °· While awake, apply ice to the sore muscle for the first 2 days after the injury. °· Put ice in a plastic bag. °· Place a towel between your skin and the bag. °· Leave the ice on for 15 to 20 minutes each hour. °· Do not use the strained muscle for several days, until you no longer have pain. °· You may wrap the injured area with an elastic bandage for comfort. Be careful not to wrap it too tightly. This may interfere with blood circulation or increase swelling. °· Only take over-the-counter or prescription medicines for pain, discomfort, or fever as directed by your caregiver. °SEEK MEDICAL CARE IF:  °You have increasing pain or swelling in the injured area. °MAKE SURE YOU:  °· Understand these instructions. °· Will watch your condition. °· Will get help right away if you are not doing well or get worse. °Document Released: 02/06/2005 Document Revised: 05/01/2011 Document Reviewed: 02/18/2011 °[employer]® Patient Information ©2013 [employer]. ° °

## 2012-05-22 NOTE — Assessment & Plan Note (Signed)
Will d/c prilosec at this time Start nexium 20 mg daily Continue tums and rolaids as needed Avoid foods that trigger your reflux

## 2012-05-23 MED ORDER — TIZANIDINE HCL 4 MG PO TABS: 4.0000 mg | ORAL_TABLET | Freq: Three times a day (TID) | ORAL | Status: DC | PRN

## 2012-05-23 NOTE — Telephone Encounter (Signed)
Rx for Tizanidine sent to [facility].

## 2012-05-23 NOTE — Telephone Encounter (Signed)
Ash, Can switch to Zannaflex (Tizanidine) 4 mg Q8H prn # 20 no refills

## 2012-08-02 ENCOUNTER — Other Ambulatory Visit: Payer: Self-pay | Admitting: Internal Medicine

## 2012-09-23 IMAGING — CR DG BONE SURVEY MET
8 of 10 series · 8 of 10 positions shown · non-contrast
Comparison: Dictated report from metastatic bone survey of
05/03/2010

CLINICAL DATA: Monoclonal gammopathy - - rule out multiple myeloma

METASTATIC BONE SURVEY

[w chest pa]
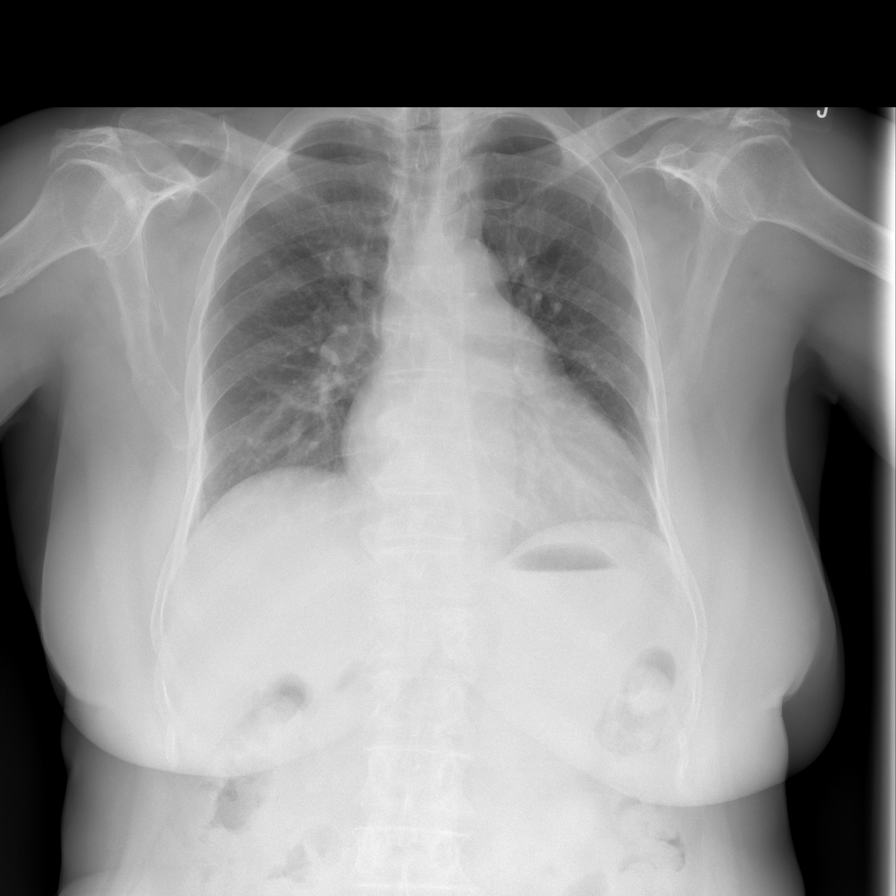

[w c-spine lat]
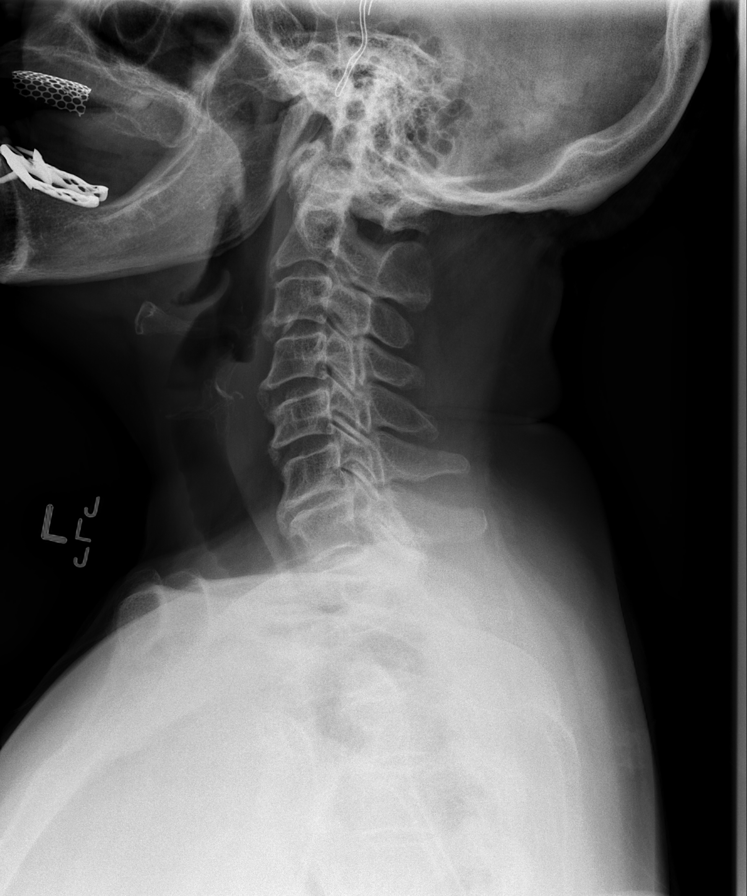

[w skull lat (1 of 2)]
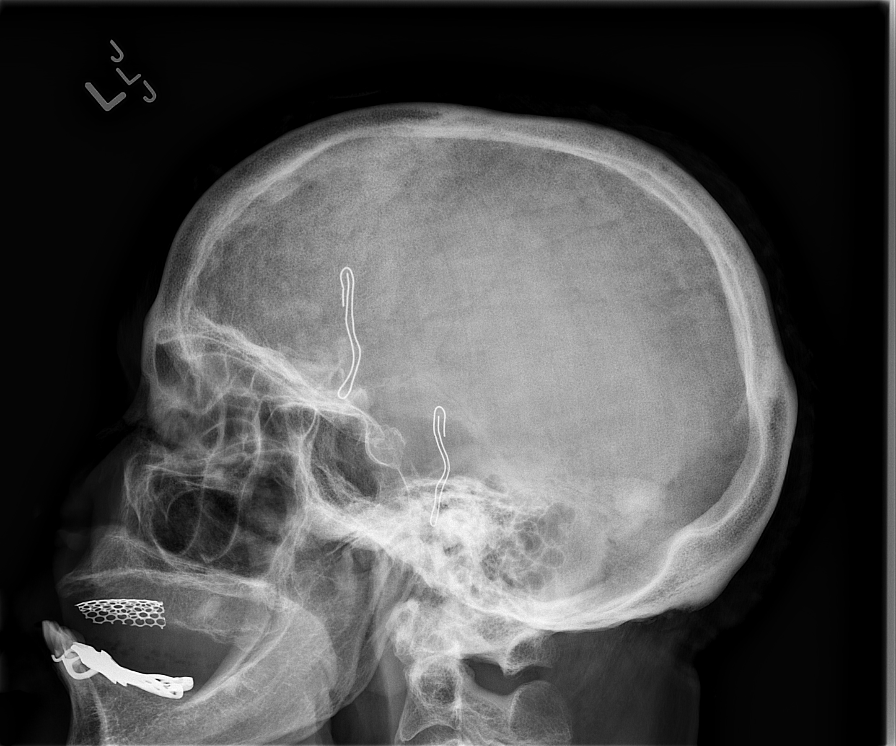

[w skull lat (2 of 2)]
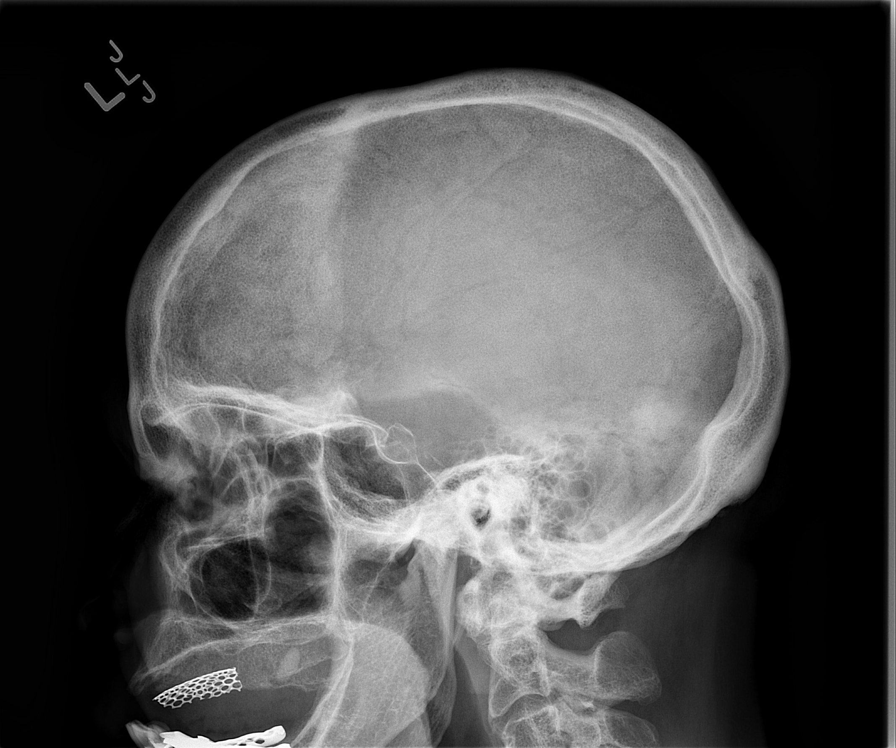

[t c-spine a.p.]
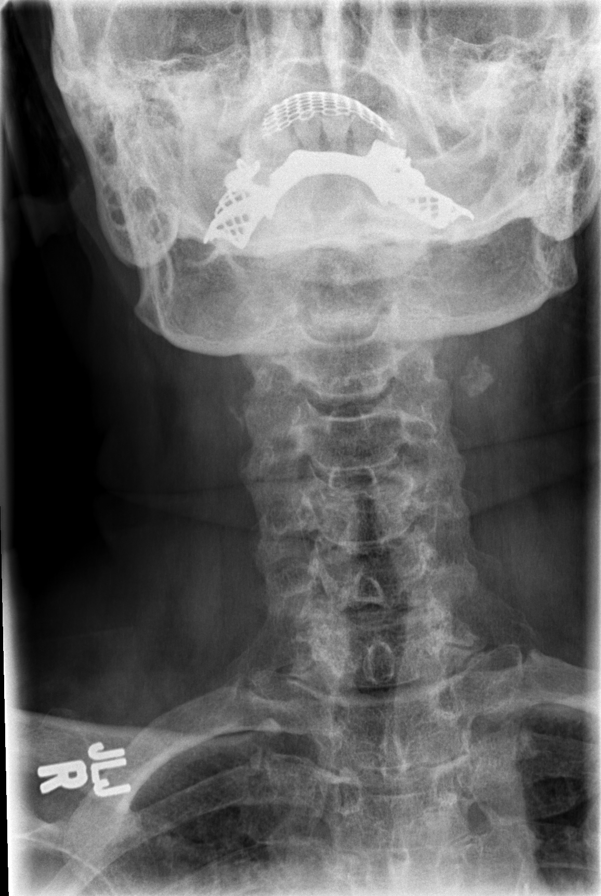

[t t-spine a.p.]
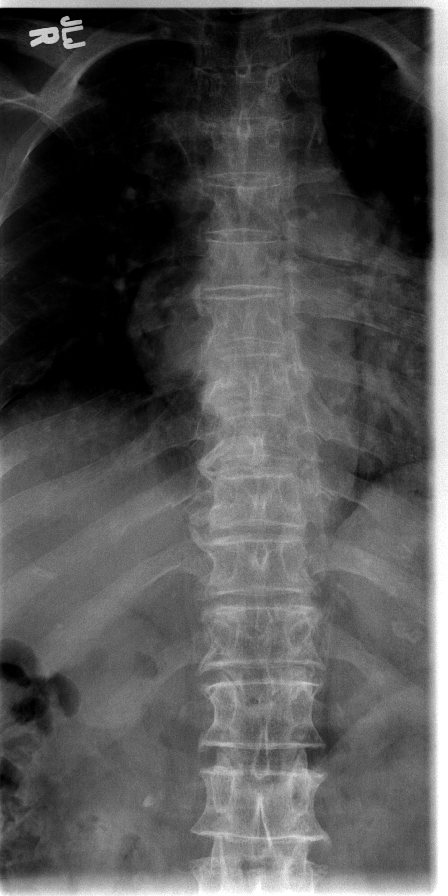

[t l-spine a.p.]
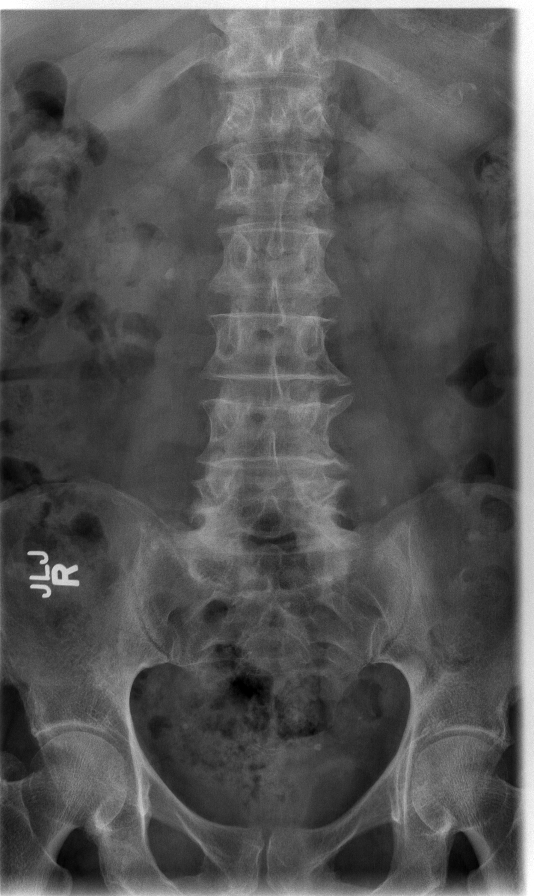

[t pelvis a.p.]
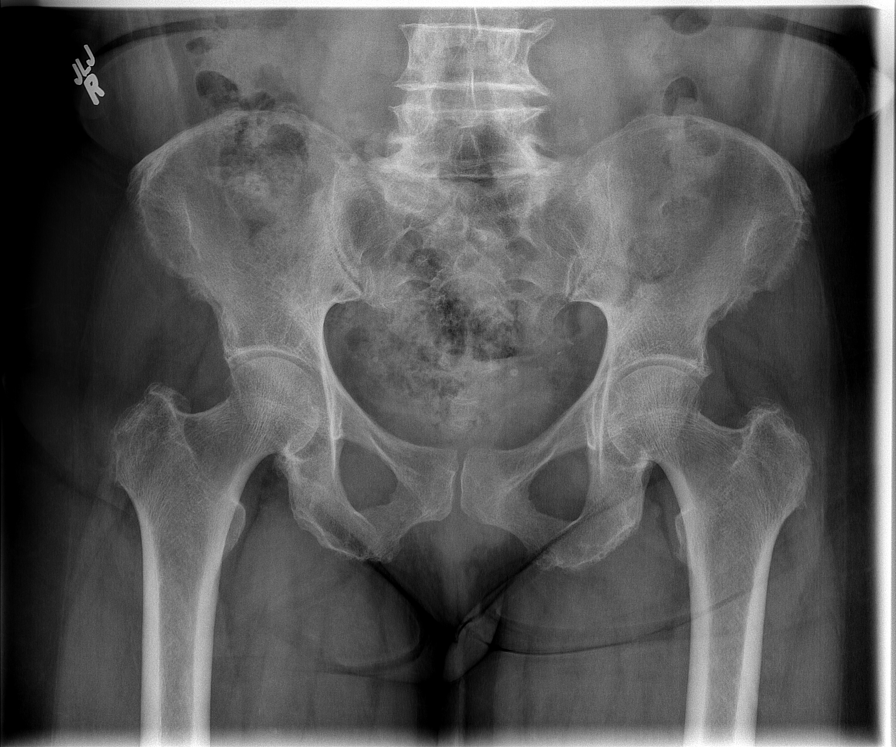

[8 of 10 positions shown; findings below may reference images not displayed]

FINDINGS: A lateral view the skull shows no calvarial abnormality.
Linear artifacts overlie the calvarium with dentures present as
well. A repeat view is obtained with removal of the linear
artifacts showing no bony abnormality.

Two views of the cervical spine show normal alignment.  There is
degenerative disc disease particularly at C6-7 with anterior
osteophytes diffusely.  No radiolucent lesion is seen.

Two views of the thoracic spine show diffuse degenerative change.
No compression deformity is noted.  The bones are osteopenic.

Two views of the lumbar spine show normal alignment.  There is
degenerative disc disease particularly at L5 S1 but also at the L4-
5 level.  No compression deformity is seen. There is a questionable
rounded opacity overlying the upper pole of the right kidney which
may simply represent a right renal cyst but if further assessment
is warranted, CT of the abdomen pelvis would be recommended.

A view of the pelvis shows only mild degenerative joint disease of
the hips.  No acute abnormality is seen.  The pelvic rami are
intact.  The SI joints appear normal.

Views of both femurs show no radiolucent lesion.

Views of the right shoulder and humerus show degenerative change in
the right glenohumeral joint space.  However no radiolucent lesion
is seen.

Views of the left shoulder and left humerus also show degenerative
joint disease of the left shoulder.  No radiolucent bony lesion is
seen.

A frontal view of the chest shows the lungs to be clear.  There is
cardiomegaly present.  No definite rib abnormality is seen.
IMPRESSION: 1.  No radiolucent bony lesions are seen to indicate multiple
myeloma.
2.  Degenerative disc disease throughout the cervical, thoracic,
and lumbar spine with osteopenia.  No compression deformity.
3.  Degenerative joint disease of both shoulders.
4.  Rounded opacity in the right upper quadrant may represent a
right renal cyst but consider CT of the abdomen pelvis if further
assessment is warranted.

## 2012-10-23 ENCOUNTER — Ambulatory Visit (INDEPENDENT_AMBULATORY_CARE_PROVIDER_SITE_OTHER): Payer: Medicare Other | Admitting: Internal Medicine

## 2012-10-23 ENCOUNTER — Other Ambulatory Visit (INDEPENDENT_AMBULATORY_CARE_PROVIDER_SITE_OTHER): Payer: Medicare Other

## 2012-10-23 ENCOUNTER — Telehealth: Payer: Self-pay

## 2012-10-23 ENCOUNTER — Encounter: Payer: Self-pay | Admitting: Internal Medicine

## 2012-10-23 VITALS — BP 132/70 | HR 68 | Temp 98.6°F | Wt 177.8 lb

## 2012-10-23 DIAGNOSIS — K5900 Constipation, unspecified: Secondary | ICD-10-CM

## 2012-10-23 DIAGNOSIS — E785 Hyperlipidemia, unspecified: Secondary | ICD-10-CM

## 2012-10-23 DIAGNOSIS — K219 Gastro-esophageal reflux disease without esophagitis: Secondary | ICD-10-CM

## 2012-10-23 LAB — LIPID PANEL
Cholesterol: 171 mg/dL (ref 0–200)
HDL: 51.1 mg/dL (ref >39.00)
LDL Cholesterol: 95 mg/dL (ref 0–99)
Total CHOL/HDL Ratio: 3
Triglycerides: 124 mg/dL (ref 0.0–149.0)
VLDL: 24.8 mg/dL (ref 0.0–40.0)

## 2012-10-23 NOTE — Telephone Encounter (Signed)
Notified patient of normal results.

## 2012-10-23 NOTE — Patient Instructions (Signed)

## 2012-10-23 NOTE — Assessment & Plan Note (Signed)
Chronic Consider referral to GI Add senna to your regimen

## 2012-10-23 NOTE — Progress Notes (Signed)
Subjective:    Patient ID: Brianna Pittman, female    DOB: 1940/05/25, 72 y.o.   MRN: [id number]  HPI  Pt presents to the clinic today with c/o worsening reflux over the past 2 weeks. It seems to be worse at night and when she lays down. She is on pepcid and nexium as prescribed. Additionally, she c/o constipation. This is a chronic issue for her. She has a BM about 2 times per week. She is on colace daily, metamucil prn. She does eat a lot of fruits and vegetables. She does drink a lot of water. She did have a colonoscopy 2 years ago. She has had a EGD which was normal. She has been evaluated by GI with a negative workup. She also would like to have her cholesterol checked today. She is on Zocor. She is tolerating the medication well without any side effects.  Review of Systems      Past Medical History  Diagnosis Date  . Hypertension   . Hyperlipidemia   . Depression with anxiety   . Chest pain   . Benign neoplasm of unspecified site   . Esophageal reflux   . Osteoarthrosis, unspecified whether generalized or localized, unspecified site   . Allergy   . Anemia   . Anxiety   . Blood transfusion   . Cataract   . Ulcer   . Colon polyps   . Vitamin D deficiency   . Depression     h/o depression    Current Outpatient Prescriptions  Medication Sig Dispense Refill  . amLODipine (NORVASC) 10 MG tablet Take 1 tablet (10 mg total) by mouth daily.  90 tablet  3  . docusate sodium (COLACE) 100 MG capsule Take 1 capsule (100 mg total) by mouth 2 (two) times daily.  180 capsule  3  . famotidine (PEPCID) 20 MG tablet One at bedtime  90 tablet  3  . methocarbamol (ROBAXIN) 500 MG tablet TAKE 1 TABLET BY MOUTH THREE TIMES DAILY  30 tablet  0  . nebivolol (BYSTOLIC) 5 MG tablet Take 1 tablet (5 mg total) by mouth daily.  90 tablet  3  . simvastatin (ZOCOR) 20 MG tablet Take 1 tablet (20 mg total) by mouth daily.  90 tablet  3  . tiZANidine (ZANAFLEX) 4 MG tablet Take 1 tablet (4 mg total) by  mouth every 8 (eight) hours as needed.  20 tablet  0  . esomeprazole (NEXIUM) 20 MG capsule Take 1 capsule (20 mg total) by mouth daily before breakfast.  90 capsule  2   No current facility-administered medications for this visit.    Allergies  Allergen Reactions  . Citrus Other (See Comments)    Upset stomach  . Ibuprofen     REACTION: itching  . Other Other (See Comments)    Coffee:severe acid reflux    Family History  Problem Relation Age of Onset  . Ovarian cancer Mother   . Cancer Mother     ?ovacrian ca  . Colon cancer Neg Hx   . Esophageal cancer Neg Hx   . Rectal cancer Neg Hx   . Stomach cancer Neg Hx   . Diabetes Father   . Diabetes Brother   . Cancer Brother     prostate  . Diabetes Maternal Grandmother   . Diabetes Other   . Diabetes Brother     History   Social History  . Marital Status: Single    Spouse Name: N/A    Number of  Children: 3  . Years of Education: N/A   Occupational History  . Retired    Social History Main Topics  . Smoking status: Never Smoker   . Smokeless tobacco: Never Used     Comment: been around 2nd hand smoke  . Alcohol Use: No  . Drug Use: No  . Sexual Activity: No   Other Topics Concern  . Not on file   Social History Narrative  . No narrative on file     Constitutional: Denies fever, malaise, fatigue, headache or abrupt weight changes.  Respiratory: Denies difficulty breathing, shortness of breath, cough or sputum production.   Cardiovascular: Denies chest pain, chest tightness, palpitations or swelling in the hands or feet.  Gastrointestinal: Pt reports reflux and constipation. Denies abdominal pain, bloating, diarrhea or blood in the stool.  Neurological: Denies dizziness, difficulty with memory, difficulty with speech or problems with balance and coordination.   No other specific complaints in a complete review of systems (except as listed in HPI above).  Objective:   Physical Exam   BP 132/70  Pulse  68  Temp(Src) 98.6 F (37 C) (Oral)  Wt 177 lb 12.8 oz (80.65 kg)  BMI 30.5 kg/m2  SpO2 97%  LMP 07/24/1993 Wt Readings from Last 3 Encounters:  10/23/12 177 lb 12.8 oz (80.65 kg)  05/22/12 176 lb (79.833 kg)  04/17/12 175 lb (79.379 kg)    General: Appears her stated age, well developed, well nourished in NAD.  Cardiovascular: Normal rate and rhythm. S1,S2 noted.  No murmur, rubs or gallops noted. No JVD or BLE edema. No carotid bruits noted. Pulmonary/Chest: Normal effort and positive vesicular breath sounds. No respiratory distress. No wheezes, rales or ronchi noted.  Abdomen: Soft and tender in the epigastric area. Normal bowel sounds, no bruits noted. No distention or masses noted. Liver, spleen and kidneys non palpable.  Neurological: Alert and oriented. Cranial nerves II-XII intact. Coordination normal. +DTRs bilaterally.   BMET    Component Value Date/Time   NA 138 03/21/2012 0035   NA 139 10/20/2011 1457   K 3.9 03/21/2012 0035   K 3.9 10/20/2011 1457   CL 106 03/21/2012 0035   CL 106 10/20/2011 1457   CO2 24 03/21/2012 0035   CO2 26 10/20/2011 1457   GLUCOSE 103* 03/21/2012 0035   GLUCOSE 127* 10/20/2011 1457   BUN 17 03/21/2012 0035   BUN 18.0 10/20/2011 1457   CREATININE 0.83 03/21/2012 0035   CREATININE 1.0 10/20/2011 1457   CREATININE 0.85 10/09/2011 1457   CALCIUM 8.4 03/21/2012 0035   CALCIUM 8.8 10/20/2011 1457   GFRNONAA 70* 03/21/2012 0035   GFRAA 81* 03/21/2012 0035    Lipid Panel     Component Value Date/Time   CHOL 185 05/22/2012 1506   TRIG 96.0 05/22/2012 1506   HDL 53.90 05/22/2012 1506   CHOLHDL 3 05/22/2012 1506   VLDL 19.2 05/22/2012 1506   LDLCALC 112* 05/22/2012 1506    CBC    Component Value Date/Time   WBC 3.2* 03/21/2012 0035   WBC 2.6* 11/24/2011 1411   RBC 3.62* 03/21/2012 0035   RBC 3.61* 11/24/2011 1411   HGB 11.2* 03/21/2012 0035   HGB 11.4* 11/24/2011 1411   HCT 33.8* 03/21/2012 0035   HCT 34.5* 11/24/2011 1411   PLT 167 03/21/2012 0035   PLT 170  11/24/2011 1411   MCV 93.4 03/21/2012 0035   MCV 95.8 11/24/2011 1411   MCH 30.9 03/21/2012 0035   MCH 31.7 11/24/2011 1411  MCHC 33.1 03/21/2012 0035   MCHC 33.1 11/24/2011 1411   RDW 13.9 03/21/2012 0035   RDW 14.9* 11/24/2011 1411   LYMPHSABS 1.6 03/21/2012 0035   LYMPHSABS 1.1 11/24/2011 1411   MONOABS 0.2 03/21/2012 0035   MONOABS 0.2 11/24/2011 1411   EOSABS 0.1 03/21/2012 0035   EOSABS 0.0 11/24/2011 1411   BASOSABS 0.0 03/21/2012 0035   BASOSABS 0.0 11/24/2011 1411    Hgb A1C Lab Results  Component Value Date   HGBA1C 5.8 01/11/2010         Assessment & Plan:

## 2012-10-23 NOTE — Assessment & Plan Note (Signed)
On zocor-tolerating well Will recheck lipid profile today

## 2012-11-01 ENCOUNTER — Other Ambulatory Visit: Payer: Self-pay | Admitting: Internal Medicine

## 2012-11-11 ENCOUNTER — Telehealth: Payer: Self-pay | Admitting: Internal Medicine

## 2012-11-11 NOTE — Telephone Encounter (Signed)
Pt came by and change appt to end of month for labs  And x-ray

## 2012-11-18 ENCOUNTER — Other Ambulatory Visit: Payer: Self-pay | Admitting: Internal Medicine

## 2012-11-18 DIAGNOSIS — D472 Monoclonal gammopathy: Secondary | ICD-10-CM

## 2012-11-19 ENCOUNTER — Other Ambulatory Visit (HOSPITAL_BASED_OUTPATIENT_CLINIC_OR_DEPARTMENT_OTHER): Payer: Medicare Other | Admitting: Lab

## 2012-11-19 ENCOUNTER — Ambulatory Visit (HOSPITAL_COMMUNITY)
Admission: RE | Admit: 2012-11-19 | Discharge: 2012-11-19 | Disposition: A | Discharge status: Home / Self Care | Payer: Medicare Other | Source: Ambulatory Visit | Attending: Hematology and Oncology | Admitting: Hematology and Oncology

## 2012-11-19 DIAGNOSIS — D472 Monoclonal gammopathy: Secondary | ICD-10-CM | POA: Insufficient documentation

## 2012-11-19 DIAGNOSIS — M159 Polyosteoarthritis, unspecified: Secondary | ICD-10-CM | POA: Insufficient documentation

## 2012-11-19 LAB — CBC WITH DIFFERENTIAL/PLATELET
BASO%: 0.5 % (ref 0.0–2.0)
Basophils Absolute: 0 10*3/uL (ref 0.0–0.1)
EOS%: 2.3 % (ref 0.0–7.0)
Eosinophils Absolute: 0.1 10*3/uL (ref 0.0–0.5)
HCT: 33.3 % — ABNORMAL LOW (ref 34.8–46.6)
HGB: 11.2 g/dL — ABNORMAL LOW (ref 11.6–15.9)
LYMPH%: 45.8 % (ref 14.0–49.7)
MCH: 31.4 pg (ref 25.1–34.0)
MCHC: 33.5 g/dL (ref 31.5–36.0)
MCV: 93.6 fL (ref 79.5–101.0)
MONO#: 0.3 10*3/uL (ref 0.1–0.9)
MONO%: 9.5 % (ref 0.0–14.0)
NEUT#: 1.1 10*3/uL — ABNORMAL LOW (ref 1.5–6.5)
NEUT%: 41.9 % (ref 38.4–76.8)
Platelets: 187 10*3/uL (ref 145–400)
RBC: 3.56 10*6/uL — ABNORMAL LOW (ref 3.70–5.45)
RDW: 14.6 % — ABNORMAL HIGH (ref 11.2–14.5)
WBC: 2.7 10*3/uL — ABNORMAL LOW (ref 3.9–10.3)
lymph#: 1.2 10*3/uL (ref 0.9–3.3)

## 2012-11-19 LAB — COMPREHENSIVE METABOLIC PANEL (CC13)
ALT: 10 U/L (ref 0–55)
AST: 16 U/L (ref 5–34)
Albumin: 3.3 g/dL — ABNORMAL LOW (ref 3.5–5.0)
Alkaline Phosphatase: 65 U/L (ref 40–150)
BUN: 17.2 mg/dL (ref 7.0–26.0)
CO2: 23 meq/L (ref 22–29)
Calcium: 8.8 mg/dL (ref 8.4–10.4)
Chloride: 110 meq/L — ABNORMAL HIGH (ref 98–109)
Creatinine: 0.9 mg/dL (ref 0.6–1.1)
Glucose: 102 mg/dl (ref 70–140)
Potassium: 3.9 meq/L (ref 3.5–5.1)
Sodium: 140 mEq/L (ref 136–145)
Total Bilirubin: 0.54 mg/dL (ref 0.20–1.20)
Total Protein: 8.3 g/dL (ref 6.4–8.3)

## 2012-11-21 ENCOUNTER — Other Ambulatory Visit: Payer: Self-pay | Admitting: Internal Medicine

## 2012-11-21 DIAGNOSIS — D472 Monoclonal gammopathy: Secondary | ICD-10-CM

## 2012-11-21 DIAGNOSIS — D649 Anemia, unspecified: Secondary | ICD-10-CM

## 2012-11-21 DIAGNOSIS — D709 Neutropenia, unspecified: Secondary | ICD-10-CM

## 2012-11-21 LAB — KAPPA/LAMBDA LIGHT CHAINS
Kappa free light chain: 1.11 mg/dL (ref 0.33–1.94)
Kappa:Lambda Ratio: 0.3 (ref 0.26–1.65)
Lambda Free Lght Chn: 3.64 mg/dL — ABNORMAL HIGH (ref 0.57–2.63)

## 2012-11-21 LAB — SPEP & IFE WITH QIG
Albumin ELP: 49.2 % — ABNORMAL LOW (ref 55.8–66.1)
Alpha-1-Globulin: 3.3 % (ref 2.9–4.9)
Alpha-2-Globulin: 8.7 % (ref 7.1–11.8)
Beta 2: 2.6 % — ABNORMAL LOW (ref 3.2–6.5)
Beta Globulin: 3.8 % — ABNORMAL LOW (ref 4.7–7.2)
Gamma Globulin: 32.4 % — ABNORMAL HIGH (ref 11.1–18.8)
IgA: 47 mg/dL — ABNORMAL LOW (ref 69–380)
IgG (Immunoglobin G), Serum: 2600 mg/dL — ABNORMAL HIGH (ref 690–1700)
IgM, Serum: 28 mg/dL — ABNORMAL LOW (ref 52–322)
M-Spike, %: 2.02 g/dL
Total Protein, Serum Electrophoresis: 7.8 g/dL (ref 6.0–8.3)

## 2012-11-22 ENCOUNTER — Telehealth: Payer: Self-pay | Admitting: Internal Medicine

## 2012-11-22 ENCOUNTER — Ambulatory Visit: Payer: Medicare Other | Admitting: Hematology and Oncology

## 2012-11-22 ENCOUNTER — Other Ambulatory Visit: Payer: Self-pay | Admitting: Internal Medicine

## 2012-11-22 ENCOUNTER — Ambulatory Visit (HOSPITAL_BASED_OUTPATIENT_CLINIC_OR_DEPARTMENT_OTHER): Payer: Medicare Other | Admitting: Internal Medicine

## 2012-11-22 VITALS — BP 138/71 | HR 71 | Temp 97.2°F | Resp 18 | Ht 64.0 in | Wt 176.2 lb

## 2012-11-22 DIAGNOSIS — D649 Anemia, unspecified: Secondary | ICD-10-CM

## 2012-11-22 DIAGNOSIS — D472 Monoclonal gammopathy: Secondary | ICD-10-CM

## 2012-11-22 DIAGNOSIS — D709 Neutropenia, unspecified: Secondary | ICD-10-CM

## 2012-11-22 NOTE — Patient Instructions (Addendum)
Patient information: Monoclonal gammopathy of undetermined significance (The Basics) View in SpanishWritten by the doctors and editors at [employer]ToDate  What is monoclonal gammopathy of undetermined significance? - Monoclonal gammopathy of undetermined significance, called "MGUS" for short, is a condition that involves 1 type of white blood cell. White blood cells fight infections in the body. They are made in the bone marrow, which is the middle part of bones.  When people have MGUS, their bone marrow makes too many of 1 type of white blood cell. These white blood cells make a protein called monoclonal protein (M-protein). MGUS causes no symptoms and, in most cases, does not lead to any problems. But in some cases, MGUS can turn into a serious condition. One serious condition is multiple myeloma, which is a cancer of the white blood cells involved in MGUS. What are the symptoms of MGUS? - MGUS does not cause any symptoms. Your doctor or nurse will suspect you have it after you have lab tests done for another reason. Is there a test for MGUS? - Yes. If your doctor or nurse suspects you have MGUS from other lab test results, he or she will do an exam and further tests. These can include: Blood tests  Urine tests  A bone marrow biopsy - For this test, a doctor takes a very small sample of the bone marrow. Then another doctor looks at the sample under a microscope to see which cells are present.  Imaging tests, such as X-rays, CT scans, or MRI scans - Imaging tests create pictures of the inside of the body.  How is MGUS treated? - MGUS does not need treatment. But your doctor will monitor your condition closely. That way, he or she will know if your MGUS turns into a serious condition that does need treatment. To monitor your condition, your doctor will talk with you and do exams on a regular basis. He or she might also order repeat blood and urine tests. How often these tests are done depends on your individual  situation. People with MGUS have a higher-than-normal chance of breaking a bone. Because of this, your doctor will check you for osteoporosis, a disease that makes your bones weak. If you have osteoporosis, he or she will treat it. If you don't have osteoporosis, your doctor will recommend things you can do to help keep your bones strong. This includes getting enough calcium and vitamin D.  What symptoms should I watch for? - You should watch for symptoms that could mean your MGUS has changed into a serious condition. This change can happen quickly. Let your doctor or nurse know right away if you have any of the following symptoms: Bone pain  Feeling more tired or weak than usual  Fever  Night sweats that soak your clothes  Headache or dizziness  Weight loss  Numbness, tingling, or weakness in the chest, lower back, or legs  Blurry vision or trouble hearing  Bleeding more than usual These symptoms can also be caused by other conditions that are not serious. But your doctor or nurse will want to check that your MGUS hasn't changed into a condition that needs treatment.    Multiple Myeloma  (You do not have this) Multiple myeloma is the most common cancer of bone. It is caused by the uncontrolled multiplication of a type of white blood cell in the marrow. This white blood cell is called a plasma cell. This means the bone marrow is overworking producing plasma cells. Soon these overproduced  cells begin to take up room in the marrow that is needed by other cells. This means that there are soon not enough red or white blood cells or platelets. Not enough red cells mean that the person is anemic. There are not enough red blood cells to carry oxygen around the body. There are not enough white blood cells to fight disease. This causes the person with multiple myeloma to not feel well. There is also bone pain through much of the body. SYMPTOMS  Anemia causes fatigue (tiredness) and weakness.  Back pain  is common. This is from fractures (break in bones) caused by damage to the bones of the back.  Lack of white blood cells makes infection more likely.  Bleeding is a common problem from lack of the cells (platelets). Platelets help blood clots form. This may show up as bleeding from any place. Commonly this shows up as bleeding from the nose or gums.  Fractures (bone breaks) are more common anywhere. The back and ribs are the most commonly fractured areas. DIAGNOSIS  This tumor is often suggested by blood tests. Often doing a bone marrow sample makes the diagnosis (learning what is wrong). This is a test performed by taking a small sample of bone with a small needle. This bone often comes from the sternum (breast bone). This sample is sent to a pathologist (a specialist in looking at tissue under a microscope). After looking at the sample under the microscope, the pathologist is able to make a diagnosis of the problem. X-rays may also show boney changes. TREATMENT   Occasionally, anti-cancer medications may be used with multiple myeloma. Your caregiver can discuss this with you.  Medications can also be given to help with the bone pain.  There is no cure for multiple myeloma. Lifestyle changes can add years of quality living. HOME CARE INSTRUCTIONS  Often there is no specific treatment for multiple myeloma. Most of the treatment consists of adjustments in dietary and living activities. Some of these changes include:  Your dietitian or caregiver helping you with your dietary questions.  Taking iron and vitamins as prescribed by your caregiver.  Eating a well balanced diet.  Staying active, but follow restrictions suggested by your caregiver. Avoiding heavy lifting (more than 10 pounds) and activities that cause increased pain.  Drinking plenty of water.  Using back braces and a cane may help with some of the boney pain. SEEK IMMEDIATE MEDICAL CARE IF:  You develop severe, uncontrolled  boney pain.  You or your family notices confusion, problems with decision-making or inability to stay awake.  You notice increased urination or constipation.  You notice problems holding your water or stool.  You have numbness or loss of control of your extremities (arms/hands or legs/feet). Document Released: 11/01/2000 Document Revised: 05/01/2011 Document Reviewed: 02/02/2008 [facility] Patient Information 2014 [place], [place]. Neutropenia Neutropenia is a condition that occurs when the level of a certain type of white blood cell (neutrophil) in your body becomes lower than normal. Neutrophils are made in the bone marrow and fight infections. These cells protect against bacteria and viruses. The fewer neutrophils you have, and the longer your body remains without them, the greater your risk of getting a severe infection becomes. CAUSES  The cause of neutropenia may be hard to determine. However, it is usually due to 3 main problems:   Decreased production of neutrophils. This may be due to:  Certain medicines such as chemotherapy.  Genetic problems.  Cancer.  Radiation treatments.  Vitamin deficiency.  Some pesticides.  Increased destruction of neutrophils. This may be due to:  Overwhelming infections.  Hemolytic anemia. This is when the body destroys its own blood cells.  Chemotherapy.  Neutrophils moving to areas of the body where they cannot fight infections. This may be due to:  Dialysis procedures.  Conditions where the spleen becomes enlarged. Neutrophils are held in the spleen and are not available to the rest of the body.  Overwhelming infections. The neutrophils are held in the area of the infection and are not available to the rest of the body. SYMPTOMS  There are no specific symptoms of neutropenia. The lack of neutrophils can result in an infection, and an infection can cause various problems. DIAGNOSIS  Diagnosis is made by a blood test. A complete blood  count is performed. The normal level of neutrophils in human blood differs with age and race. Infants have lower counts than older children and adults. African Americans have lower counts than Caucasians or Asians. The average adult level is 1500 cells/mm3 of blood. Neutrophil counts are interpreted as follows:  Greater than 1000 cells/mm3 gives normal protection against infection.  500 to 1000 cells/mm3 gives an increased risk for infection.  200 to 500 cells/mm3 is a greater risk for severe infection.  Lower than 200 cells/mm3 is a marked risk of infection. This may require hospitalization and treatment with antibiotic medicines. TREATMENT  Treatment depends on the underlying cause, severity, and presence of infections or symptoms. It also depends on your health. Your caregiver will discuss the treatment plan with you. Mild cases are often easily treated and have a good outcome. Preventative measures may also be started to limit your risk of infections. Treatment can include:  Taking antibiotics.  Stopping medicines that are known to cause neutropenia.  Correcting nutritional deficiencies by eating green vegetables to supply folic acid and taking vitamin B supplements.  Stopping exposure to pesticides if your neutropenia is related to pesticide exposure.  Taking a blood growth factor called sargramostim, pegfilgrastim, or filgrastim if you are undergoing chemotherapy for cancer. This stimulates white blood cell production.  Removal of the spleen if you have Felty's syndrome and have repeated infections. HOME CARE INSTRUCTIONS   Follow your caregiver's instructions about when you need to have blood work done.  Wash your hands often. Make sure others who come in contact with you also wash their hands.  Wash raw fruits and vegetables before eating them. They can carry bacteria and fungi.  Avoid people with colds or spreadable (contagious) diseases (chickenpox, herpes zoster,  influenza).  Avoid large crowds.  Avoid construction areas. The dust can release fungus into the air.  Be cautious around children in daycare or school environments.  Take care of your respiratory system by coughing and deep breathing.  Bathe daily.  Protect your skin from cuts and burns.  Do not work in the garden or with flowers and plants.  Care for the mouth before and after meals by brushing with a soft toothbrush. If you have mucositis, do not use mouthwash. Mouthwash contains alcohol and can dry out the mouth even more.  Clean the area between the genitals and the anus (perineal area) after urination and bowel movements. Women need to wipe from front to back.  Use a water soluble lubricant during sexual intercourse and practice good hygiene after. Do not have intercourse if you are severely neutropenic. Check with your caregiver for guidelines.  Exercise daily as tolerated.  Avoid people who were  vaccinated with a live vaccine in the past 30 days. You should not receive live vaccines (polio, typhoid).  Do not provide direct care for pets. Avoid animal droppings. Do not clean litter boxes and bird cages.  Do not share food utensils.  Do not use tampons, enemas, or rectal suppositories unless directed by your caregiver.  Use an electric razor to remove hair.  Wash your hands after handling magazines, letters, and newspapers. SEEK IMMEDIATE MEDICAL CARE IF:   You have a fever.  You have chills or start to shake.  You feel nauseous or vomit.  You develop mouth sores.  You develop aches and pains.  You have redness and swelling around open wounds.  Your skin is warm to the touch.  You have pus coming from your wounds.  You develop swollen lymph nodes.  You feel weak or fatigued.  You develop red streaks on the skin. MAKE SURE YOU:  Understand these instructions.  Will watch your condition.  Will get help right away if you are not doing well or get  worse. Document Released: 07/29/2001 Document Revised: 05/01/2011 Document Reviewed: 08/26/2010 [facility] Patient Information 2014 [place], [place]. Constipation, Adult Constipation is when a person has fewer than 3 bowel movements a week; has difficulty having a bowel movement; or has stools that are dry, hard, or larger than normal. As people grow older, constipation is more common. If you try to fix constipation with medicines that make you have a bowel movement (laxatives), the problem may get worse. Long-term laxative use may cause the muscles of the colon to become weak. A low-fiber diet, not taking in enough fluids, and taking certain medicines may make constipation worse. CAUSES   Certain medicines, such as antidepressants, pain medicine, iron supplements, antacids, and water pills.   Certain diseases, such as diabetes, irritable bowel syndrome (IBS), thyroid disease, or depression.   Not drinking enough water.   Not eating enough fiber-rich foods.   Stress or travel.  Lack of physical activity or exercise.  Not going to the restroom when there is the urge to have a bowel movement.  Ignoring the urge to have a bowel movement.  Using laxatives too much. SYMPTOMS   Having fewer than 3 bowel movements a week.   Straining to have a bowel movement.   Having hard, dry, or larger than normal stools.   Feeling full or bloated.   Pain in the lower abdomen.  Not feeling relief after having a bowel movement. DIAGNOSIS  Your caregiver will take a medical history and perform a physical exam. Further testing may be done for severe constipation. Some tests may include:   A barium enema X-ray to examine your rectum, colon, and sometimes, your small intestine.  A sigmoidoscopy to examine your lower colon.  A colonoscopy to examine your entire colon. TREATMENT  Treatment will depend on the severity of your constipation and what is causing it. Some dietary treatments include  drinking more fluids and eating more fiber-rich foods. Lifestyle treatments may include regular exercise. If these diet and lifestyle recommendations do not help, your caregiver may recommend taking over-the-counter laxative medicines to help you have bowel movements. Prescription medicines may be prescribed if over-the-counter medicines do not work.  HOME CARE INSTRUCTIONS   Increase dietary fiber in your diet, such as fruits, vegetables, whole grains, and beans. Limit high-fat and processed sugars in your diet, such as [place] fries, hamburgers, cookies, candies, and soda.   A fiber supplement may be added to your  diet if you cannot get enough fiber from foods.   Drink enough fluids to keep your urine clear or pale yellow.   Exercise regularly or as directed by your caregiver.   Go to the restroom when you have the urge to go. Do not hold it.  Only take medicines as directed by your caregiver. Do not take other medicines for constipation without talking to your caregiver first. SEEK IMMEDIATE MEDICAL CARE IF:   You have bright red blood in your stool.   Your constipation lasts for more than 4 days or gets worse.   You have abdominal or rectal pain.   You have thin, pencil-like stools.  You have unexplained weight loss. MAKE SURE YOU:   Understand these instructions.  Will watch your condition.  Will get help right away if you are not doing well or get worse. Document Released: 11/05/2003 Document Revised: 05/01/2011 Document Reviewed: 01/10/2011 [facility] Patient Information 2014 [place], [place].

## 2012-11-22 NOTE — Telephone Encounter (Signed)
gv and printed appt sched and avs for pt for Sept and OCT 2015

## 2012-11-22 NOTE — Progress Notes (Signed)
[facility] OFFICE PROGRESS NOTE  Nicki Reaper, NP [address] [place] [address]  DIAGNOSIS: ANEMIA - Plan: CBC with Differential, Comprehensive metabolic panel, Kappa/lambda light chains, SPEP & IFE with QIG, IFE, Urine (with Tot Prot), Protein, urine, 24 hour, DG Bone Survey Met  Neutropenia - Plan: CBC with Differential, Comprehensive metabolic panel, Kappa/lambda light chains, SPEP & IFE with QIG, IFE, Urine (with Tot Prot), Protein, urine, 24 hour, DG Bone Survey Met  MGUS (monoclonal gammopathy of unknown significance) - Plan: CBC with Differential, Comprehensive metabolic panel, Kappa/lambda light chains, SPEP & IFE with QIG, IFE, Urine (with Tot Prot), Protein, urine, 24 hour, DG Bone Survey Met  Chief Complaint  Patient presents with  . MGUS    CURRENT THERAPY: Observation for her MGUS; Iron supplementation for her mid anemia.   INTERVAL HISTORY: Jordyan Hardiman 72 y.o. female with a history of monoclonal gammopathy of undetermined significance (2003), neutropenia and anemia presents for follow-up.  She was last seen by Dr. Janese Banks on 11/24/2011.  Today, she is without complaints.  She reports being up to date on her cancer screening exams.  She denies fevers or chills or acute shortness of breath.  She also denies night sweats or weight changes.    MEDICAL HISTORY: Past Medical History  Diagnosis Date  . Hypertension   . Hyperlipidemia   . Depression with anxiety   . Chest pain   . Benign neoplasm of unspecified site   . Esophageal reflux   . Osteoarthrosis, unspecified whether generalized or localized, unspecified site   . Allergy   . Anemia   . Anxiety   . Blood transfusion   . Cataract   . Ulcer   . Colon polyps   . Vitamin D deficiency   . Depression     h/o depression    INTERIM HISTORY: has VITAMIN D DEFICIENCY; HYPERLIPIDEMIA; ANEMIA; PANIC DISORDER; DEPRESSION; HYPERTENSION; GERD; OSTEOARTHRITIS; Myalgia; Dyspnea; Dysphagia;  Cardiomegaly; Nocturia; Constipation; Health care maintenance; Onychomycosis; Chest pain; Neutropenia; MGUS (monoclonal gammopathy of unknown significance); Dermatofibroma; Hypertension; GERD (gastroesophageal reflux disease); and Hyperlipidemia on her problem list.    ALLERGIES:  is allergic to citrus; ibuprofen; and other.  MEDICATIONS: has a current medication list which includes the following prescription(s): amlodipine, esomeprazole, famotidine, methocarbamol, nebivolol, simvastatin, and tizanidine.  SURGICAL HISTORY:  Past Surgical History  Procedure Laterality Date  . Cataracts      both eyes  . Breast reduction    . Tummy tuck    . Cesarean section      x 4  . Abdominal hysterectomy  1995    REVIEW OF SYSTEMS:   Constitutional: Denies fevers, chills or abnormal weight loss Eyes: Denies blurriness of vision Ears, nose, mouth, throat, and face: Denies mucositis or sore throat Respiratory: Denies cough, dyspnea or wheezes Cardiovascular: Denies palpitation, chest discomfort or lower extremity swelling Gastrointestinal:  Denies nausea, heartburn or change in bowel habits Skin: Denies abnormal skin rashes Lymphatics: Denies new lymphadenopathy or easy bruising Neurological:Denies numbness, tingling or new weaknesses Behavioral/Psych: Mood is stable, no new changes  All other systems were reviewed with the patient and are negative.  PHYSICAL EXAMINATION: ECOG PERFORMANCE STATUS: 0 - Asymptomatic  Blood pressure 138/71, pulse 71, temperature 97.2 F (36.2 C), temperature source Oral, resp. rate 18, height 5\' 4"  (1.626 m), weight 176 lb 3.2 oz (79.924 kg), last menstrual period 07/24/1993, SpO2 98.00%.  GENERAL:alert, no distress and comfortable SKIN: skin color, texture, turgor are normal, no rashes or significant  lesions EYES: normal, Conjunctiva are pink and non-injected, sclera clear OROPHARYNX:no exudate, no erythema and lips, buccal mucosa, and tongue normal  NECK:  supple, thyroid normal size, non-tender, without nodularity LYMPH:  no palpable lymphadenopathy in the cervical, axillary or supraclavicular LUNGS: clear to auscultation and percussion with normal breathing effort HEART: regular rate & rhythm and no murmurs and no lower extremity edema ABDOMEN:abdomen soft, non-tender and normal bowel sounds Musculoskeletal:no cyanosis of digits and no clubbing  NEURO: alert & oriented x 3 with fluent speech, no focal motor/sensory deficits  Labs:  Lab Results  Component Value Date   WBC 2.7* 11/19/2012   HGB 11.2* 11/19/2012   HCT 33.3* 11/19/2012   MCV 93.6 11/19/2012   PLT 187 11/19/2012   NEUTROABS 1.1* 11/19/2012      Chemistry      Component Value Date/Time   NA 140 11/19/2012 1021   NA 138 03/21/2012 0035   K 3.9 11/19/2012 1021   K 3.9 03/21/2012 0035   CL 106 03/21/2012 0035   CL 106 10/20/2011 1457   CO2 23 11/19/2012 1021   CO2 24 03/21/2012 0035   BUN 17.2 11/19/2012 1021   BUN 17 03/21/2012 0035   CREATININE 0.9 11/19/2012 1021   CREATININE 0.83 03/21/2012 0035   CREATININE 0.85 10/09/2011 1457      Component Value Date/Time   CALCIUM 8.8 11/19/2012 1021   CALCIUM 8.4 03/21/2012 0035   ALKPHOS 65 11/19/2012 1021   ALKPHOS 80 05/08/2011 1019   AST 16 11/19/2012 1021   AST 18 05/08/2011 1019   ALT 10 11/19/2012 1021   ALT 12 05/08/2011 1019   BILITOT 0.54 11/19/2012 1021   BILITOT 0.4 05/08/2011 1019     Basic Metabolic Panel:  Recent Labs Lab 11/19/12 1021  NA 140  K 3.9  CO2 23  GLUCOSE 102  BUN 17.2  CREATININE 0.9  CALCIUM 8.8   GFR Estimated Creatinine Clearance: 58.7 ml/min (by C-G formula based on Cr of 0.9). Liver Function Tests:  Recent Labs Lab 11/19/12 1021  AST 16  ALT 10  ALKPHOS 65  BILITOT 0.54  PROT 8.3  ALBUMIN 3.3*    CBC:  Recent Labs Lab 11/19/12 1021  WBC 2.7*  NEUTROABS 1.1*  HGB 11.2*  HCT 33.3*  MCV 93.6  PLT 187    RADIOGRAPHIC STUDIES: Dg Bone Survey Met  11/19/2012   CLINICAL DATA:   Monoclonal gammopathy of unknown significance.  EXAM: METASTATIC BONE SURVEY  COMPARISON:  10/24/2011  FINDINGS: There are no lytic lesions to suggest multiple myeloma. The visualized bones are unchanged since the prior study. There are degenerative changes in the hips, shoulders, and knees, stable.  IMPRESSION: No evidence of multiple myeloma in the skeleton. No change since the prior exam.   Electronically Signed   By: Geanie Cooley   On: 11/19/2012 11:20    ASSESSMENT: Blenda Nicely 72 y.o. female with a history of ANEMIA - Plan: CBC with Differential, Comprehensive metabolic panel, Kappa/lambda light chains, SPEP & IFE with QIG, IFE, Urine (with Tot Prot), Protein, urine, 24 hour, DG Bone Survey Met  Neutropenia - Plan: CBC with Differential, Comprehensive metabolic panel, Kappa/lambda light chains, SPEP & IFE with QIG, IFE, Urine (with Tot Prot), Protein, urine, 24 hour, DG Bone Survey Met  MGUS (monoclonal gammopathy of unknown significance) - Plan: CBC with Differential, Comprehensive metabolic panel, Kappa/lambda light chains, SPEP & IFE with QIG, IFE, Urine (with Tot Prot), Protein, urine, 24 hour, DG Bone Survey Met  PLAN:  Ms. Mcculley is a 72 year old woman with:  1. Mild chronic neutropenia. Bone marrow demonstrated adequate white blood cells. At this time no treatment is indicated from the  hematology standpoint.  2. IgG gammopathy of undetermined significance. Skeletal survey  unremarkable. The patient requires surveillance annually. No evidence of lesions on the survey.  3. Mild anemia. The patient informed to continue with oral iron and  will have followup lab work with Dr. Dierdre Searles.  4. Follow-up.  Patient will have a CBC and CMP in one year. All questions were answered. The patient knows to call the clinic with any problems, questions or concerns. We can certainly see the patient much sooner if necessary.  I spent 10 minutes counseling the patient face to face. The total time spent  in the appointment was 15 minutes.    Tyler Robidoux, MD 11/22/2012 2:33 PM

## 2013-01-05 ENCOUNTER — Encounter (HOSPITAL_COMMUNITY): Payer: Self-pay | Admitting: Emergency Medicine

## 2013-01-05 ENCOUNTER — Emergency Department (HOSPITAL_COMMUNITY)
Admission: EM | Admit: 2013-01-05 | Discharge: 2013-01-05 | Disposition: A | Discharge status: Home / Self Care | Payer: Medicare Other | Attending: Emergency Medicine | Admitting: Emergency Medicine

## 2013-01-05 DIAGNOSIS — H6690 Otitis media, unspecified, unspecified ear: Secondary | ICD-10-CM | POA: Insufficient documentation

## 2013-01-05 DIAGNOSIS — E785 Hyperlipidemia, unspecified: Secondary | ICD-10-CM | POA: Insufficient documentation

## 2013-01-05 DIAGNOSIS — Z872 Personal history of diseases of the skin and subcutaneous tissue: Secondary | ICD-10-CM | POA: Insufficient documentation

## 2013-01-05 DIAGNOSIS — Z8669 Personal history of other diseases of the nervous system and sense organs: Secondary | ICD-10-CM | POA: Insufficient documentation

## 2013-01-05 DIAGNOSIS — M1990 Unspecified osteoarthritis, unspecified site: Secondary | ICD-10-CM | POA: Insufficient documentation

## 2013-01-05 DIAGNOSIS — H6691 Otitis media, unspecified, right ear: Secondary | ICD-10-CM

## 2013-01-05 DIAGNOSIS — Z8659 Personal history of other mental and behavioral disorders: Secondary | ICD-10-CM | POA: Insufficient documentation

## 2013-01-05 DIAGNOSIS — Z79899 Other long term (current) drug therapy: Secondary | ICD-10-CM | POA: Insufficient documentation

## 2013-01-05 DIAGNOSIS — Z8639 Personal history of other endocrine, nutritional and metabolic disease: Secondary | ICD-10-CM | POA: Insufficient documentation

## 2013-01-05 DIAGNOSIS — Z862 Personal history of diseases of the blood and blood-forming organs and certain disorders involving the immune mechanism: Secondary | ICD-10-CM | POA: Insufficient documentation

## 2013-01-05 DIAGNOSIS — K219 Gastro-esophageal reflux disease without esophagitis: Secondary | ICD-10-CM | POA: Insufficient documentation

## 2013-01-05 DIAGNOSIS — I10 Essential (primary) hypertension: Secondary | ICD-10-CM | POA: Insufficient documentation

## 2013-01-05 MED ORDER — ANTIPYRINE-BENZOCAINE 5.4-1.4 % OT SOLN: 3.0000 [drp] | OTIC | Status: DC | PRN

## 2013-01-05 MED ORDER — AMOXICILLIN 500 MG PO CAPS: 500.0000 mg | ORAL_CAPSULE | Freq: Three times a day (TID) | ORAL | Status: DC

## 2013-01-05 NOTE — ED Notes (Signed)
Pt woke up Friday morning with an earache to right side.  Took advil with relief.  However, pain has returned.  Pt also states dizziness noted with earache.

## 2013-01-05 NOTE — ED Provider Notes (Signed)
CSN: [id number]     Arrival date & time 01/05/13  0040 History   First MD Initiated Contact with Patient 01/05/13 [phone]     Chief Complaint  Patient presents with  . Otalgia   (Consider location/radiation/quality/duration/timing/severity/associated sxs/prior Treatment) The history is provided by the patient.   Patient reports right ear pain that began yesterday.  States it feels like there is something "pulling" in there.  Denies any other symptoms. Denies fever, sore throat, nasal symptoms, cough, shortness of breath, chest pain. She denies any trauma to the ear or taking any medications for her ear pain. Denies any discharge from the ear   Past Medical History  Diagnosis Date  . Hypertension   . Hyperlipidemia   . Depression with anxiety   . Chest pain   . Benign neoplasm of unspecified site   . Esophageal reflux   . Osteoarthrosis, unspecified whether generalized or localized, unspecified site   . Allergy   . Anemia   . Anxiety   . Blood transfusion   . Cataract   . Ulcer   . Colon polyps   . Vitamin D deficiency   . Depression     h/o depression   Past Surgical History  Procedure Laterality Date  . Cataracts      both eyes  . Breast reduction    . Tummy tuck    . Cesarean section      x 4  . Abdominal hysterectomy  1995   Family History  Problem Relation Age of Onset  . Ovarian cancer Mother   . Cancer Mother     ?ovacrian ca  . Colon cancer Neg Hx   . Esophageal cancer Neg Hx   . Rectal cancer Neg Hx   . Stomach cancer Neg Hx   . Diabetes Father   . Diabetes Brother   . Cancer Brother     prostate  . Diabetes Maternal Grandmother   . Diabetes Other   . Diabetes Brother    History  Substance Use Topics  . Smoking status: Never Smoker   . Smokeless tobacco: Never Used     Comment: been around 2nd hand smoke  . Alcohol Use: No   OB History   Grav Para Term Preterm Abortions TAB SAB Ect Mult Living                 Review of Systems   Constitutional: Negative for fever and chills.  HENT: Positive for ear pain. Negative for congestion, dental problem, facial swelling, rhinorrhea, sinus pressure, sore throat and trouble swallowing.   Respiratory: Negative for cough and shortness of breath.   Cardiovascular: Negative for chest pain.    Allergies  Citrus; Ibuprofen; and Other  Home Medications   Current Outpatient Rx  Name  Route  Sig  Dispense  Refill  . amLODipine (NORVASC) 10 MG tablet   Oral   Take 1 tablet (10 mg total) by mouth daily.   90 tablet   3   . esomeprazole (NEXIUM) 20 MG capsule   Oral   Take 1 capsule (20 mg total) by mouth daily before breakfast.   90 capsule   2   . famotidine (PEPCID) 20 MG tablet      One at bedtime   90 tablet   3   . ibuprofen (ADVIL,MOTRIN) 200 MG tablet   Oral   Take 200 mg by mouth every 6 (six) hours as needed for moderate pain.         Marland Kitchen  nebivolol (BYSTOLIC) 5 MG tablet   Oral   Take 1 tablet (5 mg total) by mouth daily.   90 tablet   3   . simvastatin (ZOCOR) 20 MG tablet   Oral   Take 1 tablet (20 mg total) by mouth daily.   90 tablet   3    BP 130/51  Pulse 96  Temp(Src) 98.8 F (37.1 C) (Oral)  Resp 19  Ht 5\' 3"  (1.6 m)  Wt 176 lb (79.833 kg)  BMI 31.18 kg/m2  SpO2 97%  LMP 07/24/1993 Physical Exam  Nursing note and vitals reviewed. Constitutional: She appears well-developed and well-nourished. No distress.  HENT:  Head: Atraumatic. Macrocephalic.  Right Ear: Tympanic membrane is bulging.  Left Ear: Tympanic membrane normal.  Mouth/Throat: Oropharynx is clear and moist. No oropharyngeal exudate.  Right TM opaque, bulging  Eyes: Conjunctivae are normal.  Neck: Neck supple.  Cardiovascular: Normal rate and regular rhythm.   Pulmonary/Chest: Effort normal and breath sounds normal. No respiratory distress. She has no wheezes. She has no rales.  Abdominal: Soft. There is no tenderness.  Neurological: She is alert.  Skin: She is  not diaphoretic.    ED Course  Procedures (including critical care time) Labs Review Labs Reviewed - No data to display Imaging Review No results found.  EKG Interpretation   None       MDM   1. Right otitis media    Patient with isolated right ear pain without any other upper respiratory infectious symptoms. She is afebrile. Her right tympanic membrane is opaque without normal light reflexes. She is to be discharged home on amoxicillin and Auralgan. Patient denies any other symptoms currently, no other complaints. PCP follow up. Discussed findings, treatment, and follow up  with patient.  Pt given return precautions.  Pt verbalizes understanding and agrees with plan.        Trixie Dredge, PA-C 01/05/13 [phone]

## 2013-01-05 NOTE — Discharge Instructions (Signed)
Read the information below.  Use the prescribed medication as directed.  Please discuss all new medications with your pharmacist.  You may return to the Emergency Department at any time for worsening condition or any new symptoms that concern you.  If you develop high fevers that do not resolve with tylenol or ibuprofen, uncontrolled pain, bleeding from your ear, you have difficulty swallowing or breathing, or you are unable to tolerate fluids by mouth, return to the ER for a recheck.      Otitis Media, Adult A middle ear infection is an infection in the space behind the eardrum. The medical name for this is "otitis media." It may happen after a common cold. It is caused by a germ that starts growing in that space. You may feel swollen glands in your neck on the side of the ear infection. HOME CARE INSTRUCTIONS   Take your medicine as directed until it is gone, even if you feel better after the first few days.  Only take over-the-counter or prescription medicines for pain, discomfort, or fever as directed by your caregiver.  Occasional use of a nasal decongestant a couple times per day may help with discomfort and help the eustachian tube to drain better. Follow up with your caregiver in 10 to 14 days or as directed, to be certain that the infection has cleared. Not keeping the appointment could result in a chronic or permanent injury, pain, hearing loss and disability. If there is any problem keeping the appointment, you must call back to this facility for assistance. SEEK IMMEDIATE MEDICAL CARE IF:   You are not getting better in 2 to 3 days.  You have pain that is not controlled with medication.  You feel worse instead of better.  You cannot use the medication as directed.  You develop swelling, redness or pain around the ear or stiffness in your neck. MAKE SURE YOU:   Understand these instructions.  Will watch your condition.  Will get help right away if you are not doing well or  get worse. Document Released: 11/12/2003 Document Revised: 05/01/2011 Document Reviewed: 09/03/2012 [facility] Patient Information 2014 [place], [place].

## 2013-01-05 NOTE — ED Provider Notes (Signed)
Medical screening examination/treatment/procedure(s) were performed by non-physician practitioner and as supervising physician I was immediately available for consultation/collaboration.    Bryella Diviney M Jalien Weakland, MD 01/05/13 0453 

## 2013-01-13 ENCOUNTER — Telehealth: Payer: Self-pay | Admitting: Gastroenterology

## 2013-01-13 NOTE — Telephone Encounter (Signed)
Pt started having rectal bleeding on Friday has had constipation and straining. She was offered an appt for 01/14/13 with Dr Christella Hartigan but pt declined she will be out of town,  She was scheduled for 01/27/13.  Pt will continue to use miralax and call if bleeding worsens

## 2013-01-27 ENCOUNTER — Ambulatory Visit: Payer: Medicare Other | Admitting: Gastroenterology

## 2013-02-21 ENCOUNTER — Ambulatory Visit: Payer: Medicare Other | Admitting: Gastroenterology

## 2013-03-27 ENCOUNTER — Encounter: Payer: Medicare Other | Admitting: Physician Assistant

## 2013-04-14 ENCOUNTER — Other Ambulatory Visit: Payer: Self-pay | Admitting: Internal Medicine

## 2013-05-21 ENCOUNTER — Other Ambulatory Visit: Payer: Self-pay | Admitting: Internal Medicine

## 2013-05-22 ENCOUNTER — Encounter: Payer: Medicare Other | Admitting: Physician Assistant

## 2013-05-29 ENCOUNTER — Encounter (HOSPITAL_COMMUNITY): Payer: Self-pay | Admitting: Emergency Medicine

## 2013-05-29 ENCOUNTER — Emergency Department (HOSPITAL_COMMUNITY)
Admission: EM | Admit: 2013-05-29 | Discharge: 2013-05-29 | Disposition: A | Discharge status: Home / Self Care | Payer: Commercial Managed Care - HMO | Source: Home / Self Care | Attending: Family Medicine | Admitting: Family Medicine

## 2013-05-29 DIAGNOSIS — H6590 Unspecified nonsuppurative otitis media, unspecified ear: Secondary | ICD-10-CM

## 2013-05-29 DIAGNOSIS — R42 Dizziness and giddiness: Secondary | ICD-10-CM

## 2013-05-29 LAB — POCT I-STAT, CHEM 8
BUN: 18 mg/dL (ref 6–23)
Calcium, Ion: 1.19 mmol/L (ref 1.13–1.30)
Chloride: 105 mEq/L (ref 96–112)
Creatinine, Ser: 1 mg/dL (ref 0.50–1.10)
Glucose, Bld: 104 mg/dL — ABNORMAL HIGH (ref 70–99)
HCT: 36 % (ref 36.0–46.0)
Hemoglobin: 12.2 g/dL (ref 12.0–15.0)
Potassium: 3.9 mEq/L (ref 3.7–5.3)
Sodium: 143 mEq/L (ref 137–147)
TCO2: 26 mmol/L (ref 0–100)

## 2013-05-29 LAB — POCT URINALYSIS DIP (DEVICE)
Bilirubin Urine: NEGATIVE
Glucose, UA: NEGATIVE mg/dL
Hgb urine dipstick: NEGATIVE
Ketones, ur: NEGATIVE mg/dL
Leukocytes, UA: NEGATIVE
Nitrite: NEGATIVE
Protein, ur: NEGATIVE mg/dL
Specific Gravity, Urine: 1.025 (ref 1.005–1.030)
Urobilinogen, UA: 0.2 mg/dL (ref 0.0–1.0)
pH: 5.5 (ref 5.0–8.0)

## 2013-05-29 MED ORDER — IPRATROPIUM BROMIDE 0.06 % NA SOLN: 1.0000 | Freq: Four times a day (QID) | NASAL | Status: DC

## 2013-05-29 MED ORDER — MECLIZINE HCL 12.5 MG PO TABS: 12.5000 mg | ORAL_TABLET | Freq: Three times a day (TID) | ORAL | Status: DC | PRN

## 2013-05-29 NOTE — ED Provider Notes (Signed)
CSN: [id number]     Arrival date & time 05/29/13  1727 History   First MD Initiated Contact with Patient 05/29/13 1917     Chief Complaint  Patient presents with  . Dizziness   (Consider location/radiation/quality/duration/timing/severity/associated sxs/prior Treatment) HPI Comments: PCP: [place]/Dr. Ronnald Ramp Denies previous episodes.  Patient is a 73 y.o. female presenting with dizziness. The history is provided by the patient.  Dizziness Description: States the whole room "is swaying." Onset quality:  Gradual Duration:  6 days Timing:  Intermittent Chronicity:  New Context comment:  Occurs with transitions from lying to sitting and sitting to standing. Associated symptoms: no blood in stool, no chest pain, no diarrhea, no hearing loss, no nausea, no palpitations, no shortness of breath, no syncope, no tinnitus, no vision changes, no vomiting and no weakness   Associated symptoms comment:  Reports an isolated headache on 05-27-2013 that resolved spontaneously. Risk factors: anemia   Risk factors: no hx of stroke, no hx of vertigo, no Meniere's disease and no new medications     Past Medical History  Diagnosis Date  . Hypertension   . Hyperlipidemia   . Depression with anxiety   . Chest pain   . Benign neoplasm of unspecified site   . Esophageal reflux   . Osteoarthrosis, unspecified whether generalized or localized, unspecified site   . Allergy   . Anemia   . Anxiety   . Cataract   . Ulcer   . Colon polyps   . Vitamin D deficiency   . Depression     h/o depression   Past Surgical History  Procedure Laterality Date  . Cataracts      both eyes  . Breast reduction    . Tummy tuck    . Cesarean section      x 4  . Abdominal hysterectomy  1995   Family History  Problem Relation Age of Onset  . Ovarian cancer Mother   . Cancer Mother     ?ovacrian ca  . Colon cancer Neg Hx   . Esophageal cancer Neg Hx   . Rectal cancer Neg Hx   . Stomach cancer Neg Hx   . Diabetes  Father   . Diabetes Brother   . Cancer Brother     prostate  . Diabetes Maternal Grandmother   . Diabetes Other   . Diabetes Brother    History  Substance Use Topics  . Smoking status: Never Smoker   . Smokeless tobacco: Never Used     Comment: been around 2nd hand smoke  . Alcohol Use: No   OB History   Grav Para Term Preterm Abortions TAB SAB Ect Mult Living                 Review of Systems  Constitutional: Negative.   HENT: Negative.  Negative for hearing loss and tinnitus.   Eyes: Negative.   Respiratory: Negative for chest tightness and shortness of breath.   Cardiovascular: Negative for chest pain, palpitations and syncope.  Gastrointestinal: Negative for nausea, vomiting, abdominal pain, diarrhea and blood in stool.       Denies melena  Endocrine: Negative for polydipsia, polyphagia and polyuria.  Genitourinary: Negative.   Musculoskeletal: Negative.   Skin: Negative.   Neurological: Positive for dizziness. Negative for tremors, seizures, syncope, speech difficulty, weakness, light-headedness and numbness.  Psychiatric/Behavioral: Negative for confusion.    Allergies  Citrus; Ibuprofen; and Other  Home Medications   Current Outpatient Rx  Name  Route  Sig  Dispense  Refill  . amLODipine (NORVASC) 10 MG tablet   Oral   Take 1 tablet (10 mg total) by mouth daily.   90 tablet   0     PATIENT NEEDS APPOINTMENT FOR ANYMORE REFILLS   . nebivolol (BYSTOLIC) 5 MG tablet   Oral   Take 1 tablet (5 mg total) by mouth daily.   90 tablet   3   . NEXIUM 20 MG capsule      TAKE ONE CAPSULE BY MOUTH DAILY BEFORE BREAKFAST   90 capsule   0   . simvastatin (ZOCOR) 20 MG tablet   Oral   Take 1 tablet (20 mg total) by mouth daily.   90 tablet   3   . amoxicillin (AMOXIL) 500 MG capsule   Oral   Take 1 capsule (500 mg total) by mouth 3 (three) times daily.   21 capsule   0   . antipyrine-benzocaine (AURALGAN) otic solution   Right Ear   Place 3 drops  into the right ear every 2 (two) hours as needed for ear pain.   10 mL   0   . docusate sodium (COLACE) 100 MG capsule   Oral   Take 100 mg by mouth 2 (two) times daily.         . famotidine (PEPCID) 20 MG tablet      One at bedtime   90 tablet   3   . ibuprofen (ADVIL,MOTRIN) 200 MG tablet   Oral   Take 200 mg by mouth every 6 (six) hours as needed for moderate pain.         Marland Kitchen ipratropium (ATROVENT) 0.06 % nasal spray   Each Nare   Place 1 spray into both nostrils 4 (four) times daily.   15 mL   0   . meclizine (ANTIVERT) 12.5 MG tablet   Oral   Take 1 tablet (12.5 mg total) by mouth 3 (three) times daily as needed for dizziness.   15 tablet   0    BP 166/69  Pulse 65  Temp(Src) 97.9 F (36.6 C) (Oral)  SpO2 99%  LMP 07/24/1993 Physical Exam  Nursing note and vitals reviewed. Constitutional: She is oriented to person, place, and time. She appears well-developed and well-nourished. No distress.  HENT:  Head: Normocephalic and atraumatic.  Right Ear: Hearing, tympanic membrane, external ear and ear canal normal.  Left Ear: Hearing, tympanic membrane, external ear and ear canal normal.  Nose: Nose normal.  Mouth/Throat: Oropharynx is clear and moist.  Trace of clear fluid behind each TM  Eyes: Conjunctivae, EOM and lids are normal. Pupils are equal, round, and reactive to light. Right eye exhibits no nystagmus. Left eye exhibits no nystagmus.  Neck: Normal range of motion. Neck supple. No JVD present. No thyromegaly present.  Cardiovascular: Normal rate, regular rhythm and normal heart sounds.   Pulmonary/Chest: Effort normal and breath sounds normal.  Abdominal: Soft. Bowel sounds are normal. She exhibits no distension. There is no tenderness.  Musculoskeletal: Normal range of motion.  Lymphadenopathy:    She has no cervical adenopathy.  Neurological: She is alert and oriented to person, place, and time.  Skin: Skin is warm and dry.  Psychiatric: She has a  normal mood and affect. Her behavior is normal.    ED Course  Procedures (including critical care time) Labs Review Labs Reviewed  POCT I-STAT, CHEM 8 - Abnormal; Notable for the following:    Glucose, Bld 104 (*)  All other components within normal limits  POCT URINALYSIS DIP (DEVICE)   Imaging Review No results found.   MDM   1. Dizziness   2. Serous otitis media   Case discussed with Dr. Juventino Slovak. Suggested the use of Atrovent to try to improve serous otitis and meclizine for dizziness. Advised close follow up with PCP. Labs normal. Exam without focal neurological deficit or indication of acute cardiopulmonary issue.    [place], [place] 05/29/13 2025

## 2013-05-29 NOTE — Discharge Instructions (Signed)
Your labs and EKG were all normal. You dizziness may be related to the clear fluid you have behind both of your ear drums in your middle ears. Please use medications as prescribed and if symptoms do not improve, please follow up with your doctor. If symptoms become suddenly worse or severe, please re-evaluation at your nearest Emergency Room.  Benign Positional Vertigo Vertigo means you feel like you or your surroundings are moving when they are not. Benign positional vertigo is the most common form of vertigo. Benign means that the cause of your condition is not serious. Benign positional vertigo is more common in older adults. CAUSES  Benign positional vertigo is the result of an upset in the labyrinth system. This is an area in the middle ear that helps control your balance. This may be caused by a viral infection, head injury, or repetitive motion. However, often no specific cause is found. SYMPTOMS  Symptoms of benign positional vertigo occur when you move your head or eyes in different directions. Some of the symptoms may include:  Loss of balance and falls.  Vomiting.  Blurred vision.  Dizziness.  Nausea.  Involuntary eye movements (nystagmus). DIAGNOSIS  Benign positional vertigo is usually diagnosed by physical exam. If the specific cause of your benign positional vertigo is unknown, your caregiver may perform imaging tests, such as magnetic resonance imaging (MRI) or computed tomography (CT). TREATMENT  Your caregiver may recommend movements or procedures to correct the benign positional vertigo. Medicines such as meclizine, benzodiazepines, and medicines for nausea may be used to treat your symptoms. In rare cases, if your symptoms are caused by certain conditions that affect the inner ear, you may need surgery. HOME CARE INSTRUCTIONS   Follow your caregiver's instructions.  Move slowly. Do not make sudden body or head movements.  Avoid driving.  Avoid operating heavy  machinery.  Avoid performing any tasks that would be dangerous to you or others during a vertigo episode.  Drink enough fluids to keep your urine clear or pale yellow. SEEK IMMEDIATE MEDICAL CARE IF:   You develop problems with walking, weakness, numbness, or using your arms, hands, or legs.  You have difficulty speaking.  You develop severe headaches.  Your nausea or vomiting continues or gets worse.  You develop visual changes.  Your family or friends notice any behavioral changes.  Your condition gets worse.  You have a fever.  You develop a stiff neck or sensitivity to light. MAKE SURE YOU:   Understand these instructions.  Will watch your condition.  Will get help right away if you are not doing well or get worse. Document Released: 11/14/2005 Document Revised: 05/01/2011 Document Reviewed: 10/27/2010 [facility] Patient Information 2014 [place].  Dizziness Dizziness is a common problem. It is a feeling of unsteadiness or lightheadedness. You may feel like you are about to faint. Dizziness can lead to injury if you stumble or fall. A person of any age group can suffer from dizziness, but dizziness is more common in older adults. CAUSES  Dizziness can be caused by many different things, including:  Middle ear problems.  Standing for too long.  Infections.  An allergic reaction.  Aging.  An emotional response to something, such as the sight of blood.  Side effects of medicines.  Fatigue.  Problems with circulation or blood pressure.  Excess use of alcohol, medicines, or illegal drug use.  Breathing too fast (hyperventilation).  An arrhythmia or problems with your heart rhythm.  Low red blood cell count (  anemia).  Pregnancy.  Vomiting, diarrhea, fever, or other illnesses that cause dehydration.  Diseases or conditions such as Parkinson's disease, high blood pressure (hypertension), diabetes, and thyroid problems.  Exposure to extreme  heat. DIAGNOSIS  To find the cause of your dizziness, your caregiver may do a physical exam, lab tests, radiologic imaging scans, or an electrocardiography test (ECG).  TREATMENT  Treatment of dizziness depends on the cause of your symptoms and can vary greatly. HOME CARE INSTRUCTIONS   Drink enough fluids to keep your urine clear or pale yellow. This is especially important in very hot weather. In the elderly, it is also important in cold weather.  If your dizziness is caused by medicines, take them exactly as directed. When taking blood pressure medicines, it is especially important to get up slowly.  Rise slowly from chairs and steady yourself until you feel okay.  In the morning, first sit up on the side of the bed. When this seems okay, stand slowly while holding onto something until you know your balance is fine.  If you need to stand in one place for a long time, be sure to move your legs often. Tighten and relax the muscles in your legs while standing.  If dizziness continues to be a problem, have someone stay with you for a day or two. Do this until you feel you are well enough to stay alone. Have the person call your caregiver if he or she notices changes in you that are concerning.  Do not drive or use heavy machinery if you feel dizzy.  Do not drink alcohol. SEEK IMMEDIATE MEDICAL CARE IF:   Your dizziness or lightheadedness gets worse.  You feel nauseous or vomit.  You develop problems with talking, walking, weakness, or using your arms, hands, or legs.  You are not thinking clearly or you have difficulty forming sentences. It may take a friend or family member to determine if your thinking is normal.  You develop chest pain, abdominal pain, shortness of breath, or sweating.  Your vision changes.  You notice any bleeding.  You have side effects from medicine that seems to be getting worse rather than better. MAKE SURE YOU:   Understand these instructions.  Will  watch your condition.  Will get help right away if you are not doing well or get worse. Document Released: 08/02/2000 Document Revised: 05/01/2011 Document Reviewed: 08/26/2010 [facility] Patient Information 2014 [place], [place].  Serous Otitis Media  Serous otitis media is fluid in the middle ear space. This space contains the bones for hearing and air. Air in the middle ear space helps to transmit sound.  The air gets there through the eustachian tube. This tube goes from the back of the nose (nasopharynx) to the middle ear space. It keeps the pressure in the middle ear the same as the outside world. It also helps to drain fluid from the middle ear space. CAUSES  Serous otitis media occurs when the eustachian tube gets blocked. Blockage can come from:  Ear infections.  Colds and other upper respiratory infections.  Allergies.  Irritants such as cigarette smoke.  Sudden changes in air pressure (such as descending in an airplane).  Enlarged adenoids.  A mass in the nasopharynx. During colds and upper respiratory infections, the middle ear space can become temporarily filled with fluid. This can happen after an ear infection also. Once the infection clears, the fluid will generally drain out of the ear through the eustachian tube. If it does not, then  serous otitis media occurs. SIGNS AND SYMPTOMS   Hearing loss.  A feeling of fullness in the ear, without pain.  Young children may not show any symptoms but may show slight behavioral changes, such as agitation, ear pulling, or crying. DIAGNOSIS  Serous otitis media is diagnosed by an ear exam. Tests may be done to check on the movement of the eardrum. Hearing exams may also be done. TREATMENT  The fluid most often goes away without treatment. If allergy is the cause, allergy treatment may be helpful. Fluid that persists for several months may require minor surgery. A small tube is placed in the eardrum to:  Drain the  fluid.  Restore the air in the middle ear space. In certain situations, antibiotics are used to avoid surgery. Surgery may be done to remove enlarged adenoids (if this is the cause). HOME CARE INSTRUCTIONS   Keep children away from tobacco smoke.  Be sure to keep any follow-up appointments. SEEK MEDICAL CARE IF:   Your hearing is not better in 3 months.  Your hearing is worse.  You have ear pain.  You have drainage from the ear.  You have dizziness.  You have serous otitis media only in one ear or have any bleeding from your nose (epistaxis).  You notice a lump on your neck. MAKE SURE YOU:  Understand these instructions.   Will watch your condition.   Will get help right away if you are not doing well or get worse.  Document Released: 04/29/2003 Document Revised: 10/09/2012 Document Reviewed: 09/03/2012 [facility] Patient Information 2014 [place]  Vertigo Vertigo means you feel like you or your surroundings are moving when they are not. Vertigo can be dangerous if it occurs when you are at work, driving, or performing difficult activities.  CAUSES  Vertigo occurs when there is a conflict of signals sent to your brain from the visual and sensory systems in your body. There are many different causes of vertigo, including:  Infections, especially in the inner ear.  A bad reaction to a drug or misuse of alcohol and medicines.  Withdrawal from drugs or alcohol.  Rapidly changing positions, such as lying down or rolling over in bed.  A migraine headache.  Decreased blood flow to the brain.  Increased pressure in the brain from a head injury, infection, tumor, or bleeding. SYMPTOMS  You may feel as though the world is spinning around or you are falling to the ground. Because your balance is upset, vertigo can cause nausea and vomiting. You may have involuntary eye movements (nystagmus). DIAGNOSIS  Vertigo is usually diagnosed by physical exam. If the cause of  your vertigo is unknown, your caregiver may perform imaging tests, such as an MRI scan (magnetic resonance imaging). TREATMENT  Most cases of vertigo resolve on their own, without treatment. Depending on the cause, your caregiver may prescribe certain medicines. If your vertigo is related to body position issues, your caregiver may recommend movements or procedures to correct the problem. In rare cases, if your vertigo is caused by certain inner ear problems, you may need surgery. HOME CARE INSTRUCTIONS   Follow your caregiver's instructions.  Avoid driving.  Avoid operating heavy machinery.  Avoid performing any tasks that would be dangerous to you or others during a vertigo episode.  Tell your caregiver if you notice that certain medicines seem to be causing your vertigo. Some of the medicines used to treat vertigo episodes can actually make them worse in some people. SEEK IMMEDIATE MEDICAL  CARE IF:   Your medicines do not relieve your vertigo or are making it worse.  You develop problems with talking, walking, weakness, or using your arms, hands, or legs.  You develop severe headaches.  Your nausea or vomiting continues or gets worse.  You develop visual changes.  A family member notices behavioral changes.  Your condition gets worse. MAKE SURE YOU:  Understand these instructions.  Will watch your condition.  Will get help right away if you are not doing well or get worse. Document Released: 11/16/2004 Document Revised: 05/01/2011 Document Reviewed: 08/25/2010 [facility] Patient Information 2014 [place].  Vertigo Vertigo means you feel like you or your surroundings are moving when they are not. Vertigo can be dangerous if it occurs when you are at work, driving, or performing difficult activities.  CAUSES  Vertigo occurs when there is a conflict of signals sent to your brain from the visual and sensory systems in your body. There are many different causes of  vertigo, including:  Infections, especially in the inner ear.  A bad reaction to a drug or misuse of alcohol and medicines.  Withdrawal from drugs or alcohol.  Rapidly changing positions, such as lying down or rolling over in bed.  A migraine headache.  Decreased blood flow to the brain.  Increased pressure in the brain from a head injury, infection, tumor, or bleeding. SYMPTOMS  You may feel as though the world is spinning around or you are falling to the ground. Because your balance is upset, vertigo can cause nausea and vomiting. You may have involuntary eye movements (nystagmus). DIAGNOSIS  Vertigo is usually diagnosed by physical exam. If the cause of your vertigo is unknown, your caregiver may perform imaging tests, such as an MRI scan (magnetic resonance imaging). TREATMENT  Most cases of vertigo resolve on their own, without treatment. Depending on the cause, your caregiver may prescribe certain medicines. If your vertigo is related to body position issues, your caregiver may recommend movements or procedures to correct the problem. In rare cases, if your vertigo is caused by certain inner ear problems, you may need surgery. HOME CARE INSTRUCTIONS   Follow your caregiver's instructions.  Avoid driving.  Avoid operating heavy machinery.  Avoid performing any tasks that would be dangerous to you or others during a vertigo episode.  Tell your caregiver if you notice that certain medicines seem to be causing your vertigo. Some of the medicines used to treat vertigo episodes can actually make them worse in some people. SEEK IMMEDIATE MEDICAL CARE IF:   Your medicines do not relieve your vertigo or are making it worse.  You develop problems with talking, walking, weakness, or using your arms, hands, or legs.  You develop severe headaches.  Your nausea or vomiting continues or gets worse.  You develop visual changes.  A family member notices behavioral changes.  Your  condition gets worse. MAKE SURE YOU:  Understand these instructions.  Will watch your condition.  Will get help right away if you are not doing well or get worse. Document Released: 11/16/2004 Document Revised: 05/01/2011 Document Reviewed: 08/25/2010 [facility] Patient Information 2014 [place].

## 2013-05-29 NOTE — ED Provider Notes (Signed)
Medical screening examination/treatment/procedure(s) were performed by resident physician or non-physician practitioner and as supervising physician I was immediately available for consultation/collaboration.   Pauline Good MD.   Billy Fischer, MD 05/29/13 [phone]

## 2013-05-29 NOTE — ED Notes (Addendum)
C/o dizziness and blurred vision off and on for 6 days.  C/o nausea onset Tuesday and vomited x 1 that day.  No diarrhea.  C/o RLQ abdomen pain since last week- once or twice.  When she has to urinate and urgency.  No hematuria, burning or frequency in small amounts.

## 2013-06-11 ENCOUNTER — Encounter: Payer: Self-pay | Admitting: Internal Medicine

## 2013-06-11 ENCOUNTER — Ambulatory Visit (INDEPENDENT_AMBULATORY_CARE_PROVIDER_SITE_OTHER): Payer: Commercial Managed Care - HMO | Admitting: Internal Medicine

## 2013-06-11 VITALS — BP 112/84 | HR 68 | Temp 98.0°F | Resp 16 | Ht 64.0 in | Wt 175.2 lb

## 2013-06-11 DIAGNOSIS — N3281 Overactive bladder: Secondary | ICD-10-CM

## 2013-06-11 DIAGNOSIS — I10 Essential (primary) hypertension: Secondary | ICD-10-CM

## 2013-06-11 DIAGNOSIS — N318 Other neuromuscular dysfunction of bladder: Secondary | ICD-10-CM

## 2013-06-11 DIAGNOSIS — K5900 Constipation, unspecified: Secondary | ICD-10-CM | POA: Insufficient documentation

## 2013-06-11 DIAGNOSIS — K219 Gastro-esophageal reflux disease without esophagitis: Secondary | ICD-10-CM

## 2013-06-11 MED ORDER — DOCUSATE SODIUM 100 MG PO CAPS: 100.0000 mg | ORAL_CAPSULE | Freq: Two times a day (BID) | ORAL | Status: DC

## 2013-06-11 MED ORDER — SOLIFENACIN SUCCINATE 5 MG PO TABS: 5.0000 mg | ORAL_TABLET | Freq: Every day | ORAL | Status: DC

## 2013-06-11 NOTE — Progress Notes (Signed)
   Subjective:    Patient ID: Brianna Pittman, female    DOB: [birth year], 73 y.o.   MRN: [id number]  Hypertension This is a chronic problem. The current episode started more than 1 year ago. The problem is unchanged. The problem is controlled. Pertinent negatives include no anxiety, blurred vision, chest pain, headaches, malaise/fatigue, neck pain, orthopnea, palpitations, peripheral edema, PND, shortness of breath or sweats. Past treatments include beta blockers and calcium channel blockers. The current treatment provides significant improvement. There are no compliance problems.       Review of Systems  Constitutional: Negative.  Negative for fever, chills, malaise/fatigue, diaphoresis, appetite change and fatigue.  HENT: Negative.   Eyes: Negative.  Negative for blurred vision.  Respiratory: Negative.  Negative for shortness of breath.   Cardiovascular: Negative.  Negative for chest pain, palpitations, orthopnea, leg swelling and PND.  Gastrointestinal: Negative.  Negative for nausea, vomiting, abdominal pain, diarrhea, constipation and blood in stool.  Endocrine: Negative.   Genitourinary: Positive for frequency. Negative for dysuria, urgency, hematuria, flank pain, decreased urine volume, vaginal bleeding, vaginal discharge, enuresis, difficulty urinating, genital sores, vaginal pain, menstrual problem, pelvic pain and dyspareunia.  Musculoskeletal: Positive for arthralgias (bilateral knee pain). Negative for back pain, gait problem, joint swelling, myalgias, neck pain and neck stiffness.  Skin: Negative.   Allergic/Immunologic: Negative.   Neurological: Negative.  Negative for headaches.  Hematological: Negative.  Negative for adenopathy. Does not bruise/bleed easily.  Psychiatric/Behavioral: Negative.        Objective:   Physical Exam  Vitals reviewed. Constitutional: She is oriented to person, place, and time. She appears well-developed and well-nourished. No distress.  HENT:    Head: Normocephalic and atraumatic.  Mouth/Throat: Oropharynx is clear and moist. No oropharyngeal exudate.  Eyes: Conjunctivae are normal. Right eye exhibits no discharge. Left eye exhibits no discharge. No scleral icterus.  Neck: Normal range of motion. Neck supple. No JVD present. No tracheal deviation present. No thyromegaly present.  Cardiovascular: Normal rate, regular rhythm, normal heart sounds and intact distal pulses.  Exam reveals no gallop and no friction rub.   No murmur heard. Pulmonary/Chest: Effort normal and breath sounds normal. No stridor. No respiratory distress. She has no wheezes. She has no rales. She exhibits no tenderness.  Abdominal: Soft. Bowel sounds are normal. She exhibits no distension and no mass. There is no tenderness. There is no rebound and no guarding.  Musculoskeletal: Normal range of motion. She exhibits no edema and no tenderness.  Lymphadenopathy:    She has no cervical adenopathy.  Neurological: She is oriented to person, place, and time.  Skin: Skin is warm and dry. No rash noted. She is not diaphoretic. No erythema. No pallor.     Lab Results  Component Value Date   WBC 2.7* 11/19/2012   HGB 12.2 05/29/2013   HCT 36.0 05/29/2013   PLT 187 11/19/2012   GLUCOSE 104* 05/29/2013   CHOL 171 10/23/2012   TRIG 124.0 10/23/2012   HDL 51.10 10/23/2012   LDLDIRECT 119.5 11/09/2009   LDLCALC 95 10/23/2012   ALT 10 11/19/2012   AST 16 11/19/2012   NA 143 05/29/2013   K 3.9 05/29/2013   CL 105 05/29/2013   CREATININE 1.00 05/29/2013   BUN 18 05/29/2013   CO2 23 11/19/2012   TSH 1.372 09/12/2011   HGBA1C 5.8 01/11/2010       Assessment & Plan:

## 2013-06-11 NOTE — Patient Instructions (Signed)
Overactive Bladder, Adult The bladder has two functions that are totally opposite of the other. One is to relax and stretch out so it can store urine (fills like a balloon), and the other is to contract and squeeze down so that it can empty the urine that it has stored. Proper functioning of the bladder is a complex mixing of these two functions. The filling and emptying of the bladder can be influenced by:  The bladder.  The spinal cord.  The brain.  The nerves going to the bladder.  Other organs that are closely related to the bladder such as prostate in males and the vagina in females. As your bladder fills with urine, nerve signals are sent from the bladder to the brain to tell you that you may need to urinate. Normal urination requires that the bladder squeeze down with sufficient strength to empty the bladder, but this also requires that the bladder squeeze down sufficiently long to finish the job. In addition the sphincter muscles, which normally keep you from leaking urine, must also relax so that the urine can pass. Coordination between the bladder muscle squeezing down and the sphincter muscles relaxing is required to make everything happen normally. With an overactive bladder sometimes the muscles of the bladder contract unexpectedly and involuntarily and this causes an urgent need to urinate. The normal response is to try to hold urine in by contracting the sphincter muscles. Sometimes the bladder contracts so strongly that the sphincter muscles cannot stop the urine from passing out and incontinence occurs. This kind of incontinence is called urge incontinence. Having an overactive bladder can be embarrassing and awkward. It can keep you from living life the way you want to. Many people think it is just something you have to put up with as you grow older or have certain health conditions. In fact, there are treatments that can help make your life easier and more pleasant. CAUSES  Many  things can cause an overactive bladder. Possibilities include:  Urinary tract infection or infection of nearby tissues such as the prostate.  Prostate enlargement.  In women, multiple pregnancies or surgery on the uterus or urethra.  Bladder stones, inflammation or tumors.  Caffeine.  Alcohol.  Medications. For example, diuretics (drugs that help the body get rid of extra fluid) increase urine production. Some other medicines must be taken with lots of fluids.  Muscle or nerve weakness. This might be the result of a spinal cord injury, a stroke, multiple sclerosis or Parkinson's disease.  Diabetes can cause a high urine volume which fills the bladder so quickly that the normal urge to urinate is triggered very strongly. SYMPTOMS   Loss of bladder control. You feel the need to urinate and cannot make your body wait.  Sudden, strong urges to urinate.  Urinating 8 or more times a day.  Waking up to urinate two or more times a night. DIAGNOSIS  To decide if you have overactive bladder, your healthcare provider will probably:  Ask about symptoms you have noticed.  Ask about your overall health. This will include questions about any medications you are taking.  Do a physical examination. This will help determine if there are obvious blockages or other problems.  Order some tests. These might include:  A blood test to check for diabetes or other health issues that could be contributing to the problem.  Urine testing. This could measure the flow of urine and the pressure on the bladder.  A test of your neurological   system (the brain, spinal cord and nerves). This is the system that senses the need to urinate. Some of these tests are called flow tests, bladder pressure tests and electrical measurements of the sphincter muscle.  A bladder test to check whether it is emptying completely when you urinate.  Cytoscopy. This test uses a thin tube with a tiny camera on it. It offers a  look inside your urethra and bladder to see if there are problems.  Imaging tests. You might be given a contrast dye and then asked to urinate. X-rays are taken to see how your bladder is working. TREATMENT  An overactive bladder can be treated in many ways. The treatment will depend on the cause. Whether you have a mild or severe case also makes a difference. Often, treatment can be given in your healthcare provider's office or clinic. Be sure to discuss the different options with your caregiver. They include:  Behavioral treatments. These do not involve medication or surgery:  Bladder training. For this, you would follow a schedule to urinate at regular intervals. This helps you learn to control the urge to urinate. At first, you might be asked to wait a few minutes after feeling the urge. In time, you should be able to schedule bathroom visits an hour or more apart.  Kegel exercises. These exercises strengthen the pelvic floor muscles, which support the bladder. By toning these muscles, they can help control urination, even if the bladder muscles are overactive. A specialist will teach you how to do these exercises correctly. They will require daily practice.  Weight loss. If you are obese or overweight, losing weight might stop your bladder from being overactive. Talk to your healthcare provider about how many pounds you should lose. Also ask if there is a specific program or method that would work best for you.  Diet change. This might be suggested if constipation is making your overactive bladder worse. Your healthcare provider or a nutritionist can explain ways to change what you eat to ease constipation. Other people might need to take in less caffeine or alcohol. Sometimes drinking fewer fluids is needed, too.  Protection. This is not an actual treatment. But, you could wear special pads to take care of any leakage while you wait for other treatments to take effect. This will help you avoid  embarrassment.  Physical treatments.  Electrical stimulation. Electrodes will send gentle pulses to the nerves or muscles that help control the bladder. The goal is to strengthen them. Sometimes this is done with the electrodes outside of the body. Or, they might be placed inside the body (implanted). This treatment can take several months to have an effect.  Medications. These are usually used along with other treatments. Several medicines are available. Some are injected into the muscles involved in urination. Others come in pill form. Medications sometimes prescribed include:  Anticholinergics. These drugs block the signals that the nerves deliver to the bladder. This keeps it from releasing urine at the wrong time. Researchers think the drugs might help in other ways, too.  Imipramine. This is an antidepressant. But, it relaxes bladder muscles.  Botox. This is still experimental. Some people believe that injecting it into the bladder muscles will relax them so they work more normally. It has also been injected into the sphincter muscle when the sphincter muscle does not open properly. This is a temporary fix, however. Also, it might make matters worse, especially in older people.  Surgery.  A device might be implanted  bladder muscles will relax them so they work more normally. It has also been injected into the sphincter muscle when the sphincter muscle does not open properly. This is a temporary fix, however. Also, it might make matters worse, especially in older people.  · Surgery.  · A device might be implanted to help manage your nerves. It works on the nerves that signal when you need to urinate.  · Surgery is sometimes needed with electrical stimulation. If the electrodes are implanted, this is done through surgery.  · Sometimes repairs need to be made through surgery. For example, the size of the bladder can be changed. This is usually done in severe cases only.  HOME CARE INSTRUCTIONS   · Take any medications your healthcare provider prescribed or suggested. Follow the directions carefully.  · Practice any lifestyle changes that are recommended. These might include:  · Drinking less fluid or drinking at different times of the day. If you need to urinate often during the night, for  example, you may need to stop drinking fluids early in the evening.  · Cutting down on caffeine or alcohol. They can both make an overactive bladder worse. Caffeine is found in coffee, tea and sodas.  · Doing Kegel exercises to strengthen muscles.  · Losing weight, if that is recommended.  · Eating a healthy and balanced diet. This will help you avoid constipation.  · Keep a journal or a log. You might be asked to record how much you drink and when, and also when you feel the need to urinate.  · Learn how to care for implants or other devices, such as pessaries.  SEEK MEDICAL CARE IF:   · Your overactive bladder gets worse.  · You feel increased pain or irritation when you urinate.  · You notice blood in your urine.  · You have questions about any medications or devices that your healthcare provider recommended.  · You notice blood, pus or swelling at the site of any test or treatment procedure.  · You have an oral temperature above 102° F (38.9° C).  SEEK IMMEDIATE MEDICAL CARE IF:   You have an oral temperature above 102° F (38.9° C), not controlled by medicine.  Document Released: 12/03/2008 Document Revised: 05/01/2011 Document Reviewed: 12/03/2008  [employer]® Patient Information ©2014 [employer].

## 2013-06-11 NOTE — Progress Notes (Signed)
Pre visit review using our clinic review tool, if applicable. No additional management support is needed unless otherwise documented below in the visit note. 

## 2013-06-12 NOTE — Assessment & Plan Note (Signed)
I think she has OAB, will try vesicare for this

## 2013-06-12 NOTE — Assessment & Plan Note (Signed)
Her BP is well controlled 

## 2013-07-15 ENCOUNTER — Telehealth: Payer: Self-pay | Admitting: Internal Medicine

## 2013-07-15 ENCOUNTER — Other Ambulatory Visit: Payer: Self-pay | Admitting: Internal Medicine

## 2013-07-15 DIAGNOSIS — K5900 Constipation, unspecified: Secondary | ICD-10-CM

## 2013-07-15 DIAGNOSIS — N3281 Overactive bladder: Secondary | ICD-10-CM

## 2013-07-15 NOTE — Telephone Encounter (Signed)
Pt wants to be referred to a urologist for frequent urination.  Vesicar is not helping a lot.  Getting up every hour at night.

## 2013-07-15 NOTE — Telephone Encounter (Signed)
done

## 2013-07-15 NOTE — Telephone Encounter (Signed)
LMOM to return call.

## 2013-07-28 ENCOUNTER — Other Ambulatory Visit: Payer: Self-pay | Admitting: Internal Medicine

## 2013-07-28 ENCOUNTER — Telehealth: Payer: Self-pay | Admitting: Internal Medicine

## 2013-07-28 NOTE — Telephone Encounter (Signed)
Rec'd from [facility] forward 2 pages to [place]

## 2013-07-28 NOTE — Telephone Encounter (Signed)
Received refill requests--lpt was last seen by you and last OV was 06/11/2013--please advise if okay to refill

## 2013-08-31 ENCOUNTER — Emergency Department (HOSPITAL_COMMUNITY): Payer: Medicare HMO

## 2013-08-31 ENCOUNTER — Encounter (HOSPITAL_COMMUNITY): Payer: Self-pay | Admitting: Emergency Medicine

## 2013-08-31 ENCOUNTER — Emergency Department (HOSPITAL_COMMUNITY)
Admission: EM | Admit: 2013-08-31 | Discharge: 2013-08-31 | Disposition: A | Discharge status: Home / Self Care | Payer: Medicare HMO | Attending: Emergency Medicine | Admitting: Emergency Medicine

## 2013-08-31 DIAGNOSIS — Z860100 Personal history of colon polyps, unspecified: Secondary | ICD-10-CM | POA: Insufficient documentation

## 2013-08-31 DIAGNOSIS — R42 Dizziness and giddiness: Secondary | ICD-10-CM | POA: Insufficient documentation

## 2013-08-31 DIAGNOSIS — K219 Gastro-esophageal reflux disease without esophagitis: Secondary | ICD-10-CM | POA: Insufficient documentation

## 2013-08-31 DIAGNOSIS — I10 Essential (primary) hypertension: Secondary | ICD-10-CM | POA: Insufficient documentation

## 2013-08-31 DIAGNOSIS — Z8669 Personal history of other diseases of the nervous system and sense organs: Secondary | ICD-10-CM | POA: Insufficient documentation

## 2013-08-31 DIAGNOSIS — Z862 Personal history of diseases of the blood and blood-forming organs and certain disorders involving the immune mechanism: Secondary | ICD-10-CM | POA: Insufficient documentation

## 2013-08-31 DIAGNOSIS — E785 Hyperlipidemia, unspecified: Secondary | ICD-10-CM | POA: Insufficient documentation

## 2013-08-31 DIAGNOSIS — Z79899 Other long term (current) drug therapy: Secondary | ICD-10-CM | POA: Insufficient documentation

## 2013-08-31 DIAGNOSIS — Z7982 Long term (current) use of aspirin: Secondary | ICD-10-CM | POA: Insufficient documentation

## 2013-08-31 DIAGNOSIS — Z8659 Personal history of other mental and behavioral disorders: Secondary | ICD-10-CM | POA: Insufficient documentation

## 2013-08-31 DIAGNOSIS — M1990 Unspecified osteoarthritis, unspecified site: Secondary | ICD-10-CM | POA: Insufficient documentation

## 2013-08-31 LAB — URINALYSIS, ROUTINE W REFLEX MICROSCOPIC
Bilirubin Urine: NEGATIVE
Glucose, UA: NEGATIVE mg/dL
Hgb urine dipstick: NEGATIVE
Ketones, ur: NEGATIVE mg/dL
Nitrite: NEGATIVE
Protein, ur: NEGATIVE mg/dL
Specific Gravity, Urine: 1.017 (ref 1.005–1.030)
Urobilinogen, UA: 0.2 mg/dL (ref 0.0–1.0)
pH: 6.5 (ref 5.0–8.0)

## 2013-08-31 LAB — CBC
HCT: 35 % — ABNORMAL LOW (ref 36.0–46.0)
Hemoglobin: 11.7 g/dL — ABNORMAL LOW (ref 12.0–15.0)
MCH: 31.5 pg (ref 26.0–34.0)
MCHC: 33.4 g/dL (ref 30.0–36.0)
MCV: 94.1 fL (ref 78.0–100.0)
Platelets: 178 10*3/uL (ref 150–400)
RBC: 3.72 MIL/uL — ABNORMAL LOW (ref 3.87–5.11)
RDW: 14.4 % (ref 11.5–15.5)
WBC: 3.5 10*3/uL — ABNORMAL LOW (ref 4.0–10.5)

## 2013-08-31 LAB — URINE MICROSCOPIC-ADD ON

## 2013-08-31 LAB — BASIC METABOLIC PANEL
Anion gap: 13 (ref 5–15)
BUN: 15 mg/dL (ref 6–23)
CO2: 24 mEq/L (ref 19–32)
Calcium: 8.8 mg/dL (ref 8.4–10.5)
Chloride: 104 mEq/L (ref 96–112)
Creatinine, Ser: 0.88 mg/dL (ref 0.50–1.10)
GFR calc Af Amer: 74 mL/min — ABNORMAL LOW (ref >90)
GFR calc non Af Amer: 64 mL/min — ABNORMAL LOW (ref >90)
Glucose, Bld: 97 mg/dL (ref 70–99)
Potassium: 3.9 mEq/L (ref 3.7–5.3)
Sodium: 141 mEq/L (ref 137–147)

## 2013-08-31 LAB — I-STAT TROPONIN, ED: Troponin i, poc: 0.01 ng/mL (ref 0.00–0.08)

## 2013-08-31 LAB — I-STAT CG4 LACTIC ACID, ED: Lactic Acid, Venous: 0.97 mmol/L (ref 0.5–2.2)

## 2013-08-31 MED ORDER — MECLIZINE HCL 25 MG PO TABS: 25.0000 mg | ORAL_TABLET | Freq: Three times a day (TID) | ORAL | Status: DC | PRN

## 2013-08-31 MED ORDER — MECLIZINE HCL 25 MG PO TABS
25.0000 mg | ORAL_TABLET | Freq: Once | ORAL | Status: AC
  Administered 2013-08-31: 25 mg via ORAL
  Filled 2013-08-31: qty 1

## 2013-08-31 NOTE — ED Notes (Signed)
Patient transported to CT 

## 2013-08-31 NOTE — ED Notes (Addendum)
Pt reports she took her sleeping pill, famotidine 20 mg last night, woke up this morning and reported she felt dizzy with blurry vision. Denies pain, SOB, n/v/d. Reports she is suppose to go to the urologist on Wednesday for urinary frequency.

## 2013-08-31 NOTE — Discharge Instructions (Signed)

## 2013-08-31 NOTE — ED Provider Notes (Signed)
CSN: [id number]     Arrival date & time 08/31/13  3825 History   First MD Initiated Contact with Patient 08/31/13 [phone]     Chief Complaint  Patient presents with  . Dizziness     (Consider location/radiation/quality/duration/timing/severity/associated sxs/prior Treatment) Patient is a 73 y.o. female presenting with dizziness.  Dizziness Quality:  Lightheadedness and room spinning Severity:  Moderate Onset quality:  Sudden Timing:  Intermittent Progression:  Unchanged Chronicity:  New Context: standing up   Relieved by:  Lying down Worsened by:  Standing up Ineffective treatments:  None tried Associated symptoms: no chest pain, no shortness of breath and no vomiting     Past Medical History  Diagnosis Date  . Hypertension   . Hyperlipidemia   . Depression with anxiety   . Chest pain   . Benign neoplasm of unspecified site   . Esophageal reflux   . Osteoarthrosis, unspecified whether generalized or localized, unspecified site   . Allergy   . Anemia   . Anxiety   . Cataract   . Ulcer   . Colon polyps   . Vitamin D deficiency   . Depression     h/o depression   Past Surgical History  Procedure Laterality Date  . Cataracts      both eyes  . Breast reduction    . Tummy tuck    . Cesarean section      x 4  . Abdominal hysterectomy  1995   Family History  Problem Relation Age of Onset  . Ovarian cancer Mother   . Cancer Mother     ?ovacrian ca  . Colon cancer Neg Hx   . Esophageal cancer Neg Hx   . Rectal cancer Neg Hx   . Stomach cancer Neg Hx   . Diabetes Father   . Diabetes Brother   . Cancer Brother     prostate  . Diabetes Maternal Grandmother   . Diabetes Other   . Diabetes Brother    History  Substance Use Topics  . Smoking status: Never Smoker   . Smokeless tobacco: Never Used     Comment: been around 2nd hand smoke  . Alcohol Use: No   OB History   Grav Para Term Preterm Abortions TAB SAB Ect Mult Living                 Review of  Systems  Constitutional: Negative for fever.  Respiratory: Negative for cough and shortness of breath.   Cardiovascular: Negative for chest pain and leg swelling.  Gastrointestinal: Negative for vomiting and abdominal pain.  Neurological: Positive for dizziness.  All other systems reviewed and are negative.     Allergies  Citrus; Ibuprofen; and Other  Home Medications   Prior to Admission medications   Medication Sig Start Date End Date Taking? Authorizing Provider  amLODipine (NORVASC) 10 MG tablet Take 1 tablet (10 mg total) by mouth daily.   Yes Webb Silversmith, NP  aspirin 81 MG tablet Take 81 mg by mouth daily.   Yes Historical Provider, MD  docusate sodium (COLACE) 100 MG capsule Take 1 capsule (100 mg total) by mouth 2 (two) times daily. 06/11/13  Yes Janith Lima, MD  esomeprazole (NEXIUM) 20 MG capsule Take 20 mg by mouth daily with breakfast.   Yes Historical Provider, MD  famotidine (PEPCID) 20 MG tablet Take 20 mg by mouth at bedtime.   Yes Historical Provider, MD  nebivolol (BYSTOLIC) 5 MG tablet Take 5 mg by mouth  daily.   Yes Historical Provider, MD  simvastatin (ZOCOR) 20 MG tablet Take 20 mg by mouth daily.   Yes Historical Provider, MD  solifenacin (VESICARE) 5 MG tablet Take 1 tablet (5 mg total) by mouth daily. 06/11/13  Yes Janith Lima, MD  meclizine (ANTIVERT) 25 MG tablet Take 1 tablet (25 mg total) by mouth 3 (three) times daily as needed for dizziness. 08/31/13   Osvaldo Shipper, MD   BP 137/64  Pulse 60  Temp(Src) 98.3 F (36.8 C) (Oral)  Resp 17  Ht 5\' 3"  (1.6 m)  Wt 170 lb (77.111 kg)  BMI 30.12 kg/m2  SpO2 100%  LMP 07/24/1993 Physical Exam  Nursing note and vitals reviewed. Constitutional: She is oriented to person, place, and time. She appears well-developed and well-nourished. No distress.  HENT:  Head: Normocephalic and atraumatic.  Mouth/Throat: Oropharynx is clear and moist.  Eyes: EOM are normal. Pupils are equal, round, and reactive  to light.  Neck: Normal range of motion. Neck supple.  Cardiovascular: Normal rate and regular rhythm.  Exam reveals no friction rub.   No murmur heard. Pulmonary/Chest: Effort normal and breath sounds normal. No respiratory distress. She has no wheezes. She has no rales.  Abdominal: Soft. She exhibits no distension. There is no tenderness. There is no rebound.  Musculoskeletal: Normal range of motion. She exhibits no edema.  Neurological: She is alert and oriented to person, place, and time. No cranial nerve deficit. She exhibits normal muscle tone. Coordination normal.  Skin: She is not diaphoretic.    ED Course  Procedures (including critical care time) Labs Review Labs Reviewed  CBC - Abnormal; Notable for the following:    WBC 3.5 (*)    RBC 3.72 (*)    Hemoglobin 11.7 (*)    HCT 35.0 (*)    All other components within normal limits  BASIC METABOLIC PANEL - Abnormal; Notable for the following:    GFR calc non Af Amer 64 (*)    GFR calc Af Amer 74 (*)    All other components within normal limits  URINALYSIS, ROUTINE W REFLEX MICROSCOPIC - Abnormal; Notable for the following:    Leukocytes, UA SMALL (*)    All other components within normal limits  URINE MICROSCOPIC-ADD ON - Abnormal; Notable for the following:    Squamous Epithelial / LPF FEW (*)    Bacteria, UA FEW (*)    All other components within normal limits  I-STAT TROPOININ, ED  I-STAT CG4 LACTIC ACID, ED    Imaging Review Ct Head Wo Contrast  08/31/2013   CLINICAL DATA:  Dizziness  EXAM: CT HEAD WITHOUT CONTRAST  TECHNIQUE: Contiguous axial images were obtained from the base of the skull through the vertex without intravenous contrast.  COMPARISON:  None.  FINDINGS: Physiologic basal ganglia calcifications bilaterally. Ventricles are normal in size. Mild hypodensities in the periventricular white matter bilaterally suggest mild chronic microvascular ischemic changes. Negative for hydrocephalus, intra or extra-axial  hemorrhage, mass effect, mass lesion, or evidence of acute cortically based infarction. The skull is intact. Soft tissues of the scalp are symmetric.  IMPRESSION: No acute intracranial abnormality identified. Mild chronic microvascular ischemic changes.   Electronically Signed   By: Curlene Dolphin M.D.   On: 08/31/2013 11:06     EKG Interpretation   Date/Time:  Sunday August 31 2013 10:54:14 EDT Ventricular Rate:  59 PR Interval:  162 QRS Duration: 85 QT Interval:  434 QTC Calculation: 430 R Axis:  86 Text Interpretation:  Sinus rhythm T wave changes inferior, new  Ventricular premature complex Borderline right axis deviation Borderline  repolarization abnormality Confirmed by Mingo Amber  MD, Joeanthony Seeling ([id number]) on  08/31/2013 11:24:32 AM      MDM   Final diagnoses:  Dizziness    73 year old female presents with dizziness. It started when she got up from bed this morning. As initially described as the room moving, but then she said it became more of a lightheaded. It is better with lying still. There is no nausea or vomiting. There is no chest pain or shortness of breath. Triage note reports blurry vision, but she denies vision complaints to me. No bridging or double vision. The patient will lying in bed during my exam states no dizziness, but when I sit her up to ambulate her, she screams dizziness. She has urinary frequency and is following up with urology. No nausea or vomiting or diarrhea. No rectal bleeding. Question possible dehydration. We'll obtain orthostatics and labs. We'll scan her head, although do not believe this is related to stroke as can provoke it with movement. Meclizine given in case this is vertiginous in nature. Labs and Head CT ok. Feeling much better after meds. Patient stable for discharge, ambulating well. Given meclizine Rx.  Osvaldo Shipper, MD 08/31/13 1534

## 2013-09-03 ENCOUNTER — Telehealth: Payer: Self-pay | Admitting: Internal Medicine

## 2013-09-03 NOTE — Telephone Encounter (Signed)
Pt came by to request to speak with Dr Ronnald Ramp or the assistant about symptoms she is currently experiencing from a medication she is taking. Pt went to see her allergist today and was told she needed to inform Dr Ronnald Ramp of this matter. Pt does not have the name of the medication with her at this time. Please contact pt when this request is reviewed.

## 2013-09-04 NOTE — Telephone Encounter (Signed)
Returned call to back to patient who advised that she was seen by her eye doctor yesterday who addressed her concerns. She states that eye doctor recommended/submitted referral to a neurologist for her. I advised that she contact them in regards to status of appointment. Thanks

## 2013-09-11 ENCOUNTER — Encounter: Payer: Self-pay | Admitting: Neurology

## 2013-09-11 ENCOUNTER — Ambulatory Visit (INDEPENDENT_AMBULATORY_CARE_PROVIDER_SITE_OTHER): Payer: Medicare HMO | Admitting: Neurology

## 2013-09-11 VITALS — BP 124/82 | HR 68 | Ht 64.0 in | Wt 174.7 lb

## 2013-09-11 DIAGNOSIS — H5347 Heteronymous bilateral field defects: Secondary | ICD-10-CM

## 2013-09-11 DIAGNOSIS — H539 Unspecified visual disturbance: Secondary | ICD-10-CM

## 2013-09-11 DIAGNOSIS — R42 Dizziness and giddiness: Secondary | ICD-10-CM

## 2013-09-11 NOTE — Patient Instructions (Addendum)
1. MRI brain with and without contrast 2. MRI pituitary gland with and without contrast 3. Bloodwork for TSH, free T4, prolactin, cortisol 4. Refer to ophthalmology

## 2013-09-11 NOTE — Addendum Note (Signed)
Addended by: Thurmon Fair on: 09/11/2013 03:56 PM   Modules accepted: Orders

## 2013-09-11 NOTE — Progress Notes (Signed)
NEUROLOGY CONSULTATION NOTE  Brianna Pittman MRN: [id number] DOB: 10/19/1940  Referring provider: Dr. [name] Primary care provider: Dr. [name]  Reason for consult:  Bitemporal hemianopia  Dear Dr Ronnald Ramp:  Thank you for your kind referral of Brianna Pittman for consultation of the above symptoms. Although her history is well known to you, please allow me to reiterate it for the purpose of our medical record. Records and images were personally reviewed where available.  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  She reports that she started noticing vision difficulties a year ago, her vision would become blurred when she put on her glasses.  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  She does notice that she has to pay good attention when changing lanes, because of blurred vision mostly on the left side, but denies any loss of vision. She has occasional pain in both eyes.  She has infrequent headaches occurring every few months, mostly on the right vertex and neck region, sometimes on the left parietal region. Headaches are mostly in the evening, she had seen a neurologist in [facility] in the 1990s for the headaches and does not take any medication for these.  She has had intermittent dizziness described as a  spinning sensation that occurs when she stands up quickly.  She was in the ER for dizziness and vomiting last week, I personally reviewed head CT without contrast which did not show any acute changes.  She was diagnosed with positional vertigo with resolution of symptoms after meclizine. She reports that if she stands up slowly, she does not get the dizziness.    She has occasional right ear pain and occasionally food feels stuck in her throat when swallowing. She has arthritis and reports knee, ankle, and wrist pain and swelling, occasional neck and back pain.  She has occasional palpitations, no heat/cold intolerance, generalized fatigue, or galactorrhea.  She has an overactive bladder and takes Vesicare. There is no family history of similar symptoms except glaucoma in her father and daughter.  Laboratory Data: Lab Results  Component Value Date   WBC 3.5* 08/31/2013   HGB 11.7* 08/31/2013   HCT 35.0* 08/31/2013   MCV 94.1 08/31/2013   PLT 178 08/31/2013     Chemistry      Component Value Date/Time   NA 141 08/31/2013 1033   NA 140 11/19/2012 1021   K 3.9 08/31/2013 1033   K 3.9 11/19/2012 1021   CL 104 08/31/2013 1033   CL 106 10/20/2011 1457   CO2 24 08/31/2013 1033   CO2 23 11/19/2012 1021   BUN 15 08/31/2013 1033   BUN 17.2 11/19/2012 1021   CREATININE 0.88 08/31/2013 1033   CREATININE 0.9 11/19/2012 1021   CREATININE 0.85 10/09/2011 1457      Component Value Date/Time   CALCIUM 8.8 08/31/2013 1033   CALCIUM 8.8 11/19/2012 1021   ALKPHOS 65 11/19/2012  1021   ALKPHOS 80 05/08/2011 1019   AST 16 11/19/2012 1021   AST 18 05/08/2011 1019   ALT 10 11/19/2012 1021   ALT 12 05/08/2011 1019   BILITOT 0.54 11/19/2012 1021   BILITOT 0.4 05/08/2011 1019      PAST MEDICAL HISTORY: Past Medical History  Diagnosis Date  . Hypertension   . Hyperlipidemia   . Depression with anxiety   . Chest pain   . Benign neoplasm of unspecified site   . Esophageal reflux   . Osteoarthrosis, unspecified whether  generalized or localized, unspecified site   . Allergy   . Anemia   . Anxiety   . Cataract   . Ulcer   . Colon polyps   . Vitamin D deficiency   . Depression     h/o depression    PAST SURGICAL HISTORY: Past Surgical History  Procedure Laterality Date  . Cataracts      both eyes  . Breast reduction    . Tummy tuck    . Cesarean section      x 4  . Abdominal hysterectomy  1995    MEDICATIONS: Current Outpatient Prescriptions on File Prior to Visit  Medication Sig Dispense Refill  . amLODipine (NORVASC) 10 MG tablet Take 1 tablet (10 mg total) by mouth daily.  90 tablet  0  . aspirin 81 MG tablet Take 81 mg by mouth daily.      Marland Kitchen docusate sodium (COLACE) 100 MG capsule Take 1 capsule (100 mg total) by mouth 2 (two) times daily.  60 capsule  11  . esomeprazole (NEXIUM) 20 MG capsule Take 20 mg by mouth daily with breakfast.      . famotidine (PEPCID) 20 MG tablet Take 20 mg by mouth at bedtime.      . nebivolol (BYSTOLIC) 5 MG tablet Take 5 mg by mouth daily.      . simvastatin (ZOCOR) 20 MG tablet Take 20 mg by mouth daily.      . solifenacin (VESICARE) 5 MG tablet Take 1 tablet (5 mg total) by mouth daily.  56 tablet  0   No current facility-administered medications on file prior to visit.    ALLERGIES: Allergies  Allergen Reactions  . Citrus Other (See Comments)    Upset stomach  . Ibuprofen     REACTION: itching  . Other Other (See Comments)    Coffee:severe acid reflux    FAMILY HISTORY: Family History  Problem Relation Age of Onset  . Ovarian cancer Mother   . Colon cancer Neg Hx   . Esophageal cancer Neg Hx   . Rectal cancer Neg Hx   . Stomach cancer Neg Hx   . Diabetes Father   . Diabetes Brother     x 2  . Prostate cancer Brother   . Diabetes Maternal Grandmother   . Diabetes Other   . Diabetes Maternal Uncle   . Neurologic Disorder Neg Hx     SOCIAL HISTORY: History   Social History  . Marital Status: Single    Spouse Name: N/A     Number of Children: 3  . Years of Education: N/A   Occupational History  . Retired    Social History Main Topics  . Smoking status: Never Smoker   . Smokeless tobacco: Never Used     Comment: been around 2nd hand smoke  . Alcohol Use: No  . Drug Use: No  . Sexual Activity: No   Other Topics Concern  .  Not on file   Social History Narrative  . No narrative on file    REVIEW OF SYSTEMS: Constitutional: No fevers, chills, or sweats, no generalized fatigue, change in appetite Eyes: No visual changes, double vision, eye pain Ear, nose and throat: No hearing loss, ear pain, nasal congestion, sore throat Cardiovascular: No chest pain, occl palpitations Respiratory:  No shortness of breath at rest or with exertion, wheezes GastrointestinaI: No nausea, vomiting, diarrhea, abdominal pain, fecal incontinence Genitourinary:  No dysuria, urinary retention or frequency Musculoskeletal:  + neck pain, back pain Integumentary: No rash, pruritus, skin lesions Neurological: as above Psychiatric: No depression, insomnia, anxiety Endocrine: No palpitations, fatigue, diaphoresis, mood swings, change in appetite, change in weight, increased thirst Hematologic/Lymphatic:  No anemia, purpura, petechiae. Allergic/Immunologic: no itchy/runny eyes, nasal congestion, recent allergic reactions, rashes  PHYSICAL EXAM: Filed Vitals:   09/11/13 1045  BP: 124/82  Pulse: 68   General: No acute distress Head:  Normocephalic/atraumatic Eyes: Fundoscopic exam shows bilateral sharp discs, no vessel changes, exudates, or hemorrhages Neck: supple, no paraspinal tenderness, full range of motion Back: No paraspinal tenderness Heart: regular rate and rhythm Lungs: Clear to auscultation bilaterally. Vascular: No carotid bruits. Skin/Extremities: No rash, no edema Neurological Exam: Mental status: alert and oriented to person, place, and time, no dysarthria or aphasia, Fund of knowledge is appropriate.   Recent and remote memory are intact.  Attention and concentration are normal.    Able to name objects and repeat phrases. Cranial nerves: CN I: not tested CN II: right pupil round reactive, left pupil irregular reactive to light; on gross testing, visual fields are intact (no bitemporal hemianopia noted on gross confrontational testing), fundi unremarkable. CN III, IV, VI:  full range of motion, no nystagmus, no ptosis CN V: facial sensation intact CN VII: upper and lower face symmetric CN VIII: hearing intact to finger rub CN IX, X: gag intact, uvula midline CN XI: sternocleidomastoid and trapezius muscles intact CN XII: tongue midline Bulk & Tone: normal, no cogwheeling,no fasciculations. Motor: 5/5 throughout with no pronator drift. Sensation: intact to light touch, pin, vibration and joint position sense. Decreased cold on both LE.  No extinction to double simultaneous stimulation.  Romberg test negative Deep Tendon Reflexes: some asymmetry noted over the UE, with brisk +3 right brachialis, otherwise +2 throughout except for +1 bilateral ankle jerks, no ankle clonus, negative Hoffman's sign Plantar responses: downgoing bilaterally Cerebellar: no incoordination on finger to nose testing Gait: slow and cautious, no ataxia, good arm swing with note of pill rolling tremor on right hand Tremor: low amplitude resting tremor on right hand (mostly thumb), no postural or action tremor seen  IMPRESSION: This is a pleasant 73 year old right-handed woman with a history of hypertension, hyperlipidemia, MGUS, anemia, neutropenia, overactive bladder, presenting for 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 It was noted on her optometry visit that confrontational testing was normal, which is similar to exam today, there is no bitemporal hemianopia on gross confrontation testing, however her Humphrey Matrix perimetry test showed bitemporal hemianopia, which is  typically caused by lesions in the optic chiasm. She was in the ER last week for dizziness, likely positional vertigo. MRI brain with and without contrast with attention to the pituitary will be ordered, as well as hormone evaluation.  She has held off on driving, which I agree with.  I would like for her to be evaluated by an ophthalmologist as well.  She will follow-up after the MRI.  Thank you for allowing me to participate in the care of this patient. Please do not hesitate to call for any questions or concerns.   Ellouise Newer, M.D.  CC: Dr. Ronnald Ramp

## 2013-09-12 LAB — TSH: TSH: 1.606 u[IU]/mL (ref 0.350–4.500)

## 2013-09-12 LAB — CORTISOL: Cortisol, Plasma: 6 ug/dL

## 2013-09-12 LAB — PROLACTIN: Prolactin: 3.6 ng/mL

## 2013-09-12 LAB — T4, FREE: Free T4: 1.19 ng/dL (ref 0.80–1.80)

## 2013-09-15 ENCOUNTER — Telehealth: Payer: Self-pay | Admitting: Internal Medicine

## 2013-09-15 ENCOUNTER — Telehealth: Payer: Self-pay | Admitting: Neurology

## 2013-09-15 DIAGNOSIS — Z1231 Encounter for screening mammogram for malignant neoplasm of breast: Secondary | ICD-10-CM

## 2013-09-15 NOTE — Telephone Encounter (Signed)
Patient states that she spoke to someone this morning about a lump underneath her RT breast. She says that she also has some pain.  Not sure who patient spoke with because there is no phone note.  She is asking for an order for a mammogram so that [facility]. Please advise.

## 2013-09-15 NOTE — Telephone Encounter (Signed)
Pt states that someone called about a letter to return to work but does not know who called her please call [phone]

## 2013-09-15 NOTE — Telephone Encounter (Signed)
Pt is returning a call  

## 2013-09-17 DIAGNOSIS — Z1231 Encounter for screening mammogram for malignant neoplasm of breast: Secondary | ICD-10-CM | POA: Insufficient documentation

## 2013-09-17 NOTE — Telephone Encounter (Signed)
Mammogram has been ordered

## 2013-09-18 ENCOUNTER — Telehealth: Payer: Self-pay | Admitting: Family Medicine

## 2013-09-18 ENCOUNTER — Telehealth: Payer: Self-pay | Admitting: Neurology

## 2013-09-18 NOTE — Telephone Encounter (Signed)
Pt needs to talk to someone please [phone]

## 2013-09-18 NOTE — Telephone Encounter (Signed)
Called patient to discuss with her the appt that was made for her to see an Ophthalmologist. appt was made for 10/03/13 @ 2:00pm with Dr. Midge Aver @ [facility]. Patient has [facility] which requires referral from her pcp Dr. Ronnald Ramp at [facility]. I called over to their office to see if they could a do referral for Dr. Katy Fitch. I spoke with [place] and she was going to process the referral but noticed that Dr. Ronnald Ramp isn't listed on patient's ins card as pcp a Dr. Edilia Bo is listed as patient's pcp. The patient has never seen Dr. Edilia Bo before. I called patient to advise her that she needed to call her ins co to get the correct name of her pcp listed. I did explain to her if she kept this appt with Dr. Katy Fitch without the proper referral that her ins may not cover the office visit and she may be billed. I asked her if she wanted me to cancel the appt she states not at this time she is going to call her ins co and try and get things fixed and she would call me back to let me know the outcome.

## 2013-09-19 NOTE — Telephone Encounter (Signed)
Returned patient's call. This is a follow-up on our conversation from yesterday. She did call her insurance co & was given the name of a doctor she could establish with. She explained to me that she didn't want to go to that doctor. I again explained to her that she needs to let her insurance co know that she is already established with a pcp and she just needs him to be added to her insurance card and Dr. Arlyn Dunning name removed as that isn't her doctor and she has never been seen by Dr. Edilia Bo. She states that she would call ins co back.

## 2013-09-24 ENCOUNTER — Ambulatory Visit
Admission: RE | Admit: 2013-09-24 | Discharge: 2013-09-24 | Disposition: A | Discharge status: Home / Self Care | Payer: Commercial Managed Care - HMO | Source: Ambulatory Visit | Attending: Internal Medicine | Admitting: Internal Medicine

## 2013-09-24 DIAGNOSIS — Z1231 Encounter for screening mammogram for malignant neoplasm of breast: Secondary | ICD-10-CM

## 2013-09-24 LAB — HM MAMMOGRAPHY: HM Mammogram: ABNORMAL

## 2013-09-25 ENCOUNTER — Other Ambulatory Visit: Payer: Self-pay | Admitting: Internal Medicine

## 2013-09-25 DIAGNOSIS — R928 Other abnormal and inconclusive findings on diagnostic imaging of breast: Secondary | ICD-10-CM

## 2013-09-27 ENCOUNTER — Ambulatory Visit
Admission: RE | Admit: 2013-09-27 | Discharge: 2013-09-27 | Disposition: A | Discharge status: Home / Self Care | Payer: Commercial Managed Care - HMO | Source: Ambulatory Visit | Attending: Neurology | Admitting: Neurology

## 2013-09-27 DIAGNOSIS — H5347 Heteronymous bilateral field defects: Secondary | ICD-10-CM

## 2013-09-27 DIAGNOSIS — R42 Dizziness and giddiness: Secondary | ICD-10-CM

## 2013-09-27 MED ORDER — GADOBENATE DIMEGLUMINE 529 MG/ML IV SOLN
8.0000 mL | Freq: Once | INTRAVENOUS | Status: AC | PRN
Start: 2013-09-27 — End: 2013-09-27
  Administered 2013-09-27: 8 mL via INTRAVENOUS

## 2013-09-29 ENCOUNTER — Other Ambulatory Visit: Payer: Self-pay | Admitting: Internal Medicine

## 2013-09-29 ENCOUNTER — Other Ambulatory Visit: Payer: Self-pay

## 2013-09-29 DIAGNOSIS — R928 Other abnormal and inconclusive findings on diagnostic imaging of breast: Secondary | ICD-10-CM

## 2013-09-30 ENCOUNTER — Telehealth: Payer: Self-pay | Admitting: Neurology

## 2013-09-30 NOTE — Telephone Encounter (Signed)
I think this is an [place] patient.

## 2013-09-30 NOTE — Telephone Encounter (Signed)
I did notify her of mri results. She wants to know if you will write her a work note to go back to work only working 3 days a week, she works as a [employer]ar[employer] manager. Before she was working 7 hr days 5 days a week. She does have a follow-up with you on 8/20.

## 2013-09-30 NOTE — Telephone Encounter (Signed)
Pt called wanting to f/u on the result for her MRI and also wanted to see if she can get a back to work note. C/B [phone]

## 2013-10-01 ENCOUNTER — Ambulatory Visit
Admission: RE | Admit: 2013-10-01 | Discharge: 2013-10-01 | Disposition: A | Discharge status: Home / Self Care | Payer: Commercial Managed Care - HMO | Source: Ambulatory Visit | Attending: Internal Medicine | Admitting: Internal Medicine

## 2013-10-01 ENCOUNTER — Other Ambulatory Visit: Payer: Self-pay | Admitting: Internal Medicine

## 2013-10-01 DIAGNOSIS — R928 Other abnormal and inconclusive findings on diagnostic imaging of breast: Secondary | ICD-10-CM

## 2013-10-06 ENCOUNTER — Other Ambulatory Visit: Payer: Self-pay | Admitting: Internal Medicine

## 2013-10-06 DIAGNOSIS — R928 Other abnormal and inconclusive findings on diagnostic imaging of breast: Secondary | ICD-10-CM

## 2013-10-08 ENCOUNTER — Ambulatory Visit
Admission: RE | Admit: 2013-10-08 | Discharge: 2013-10-08 | Disposition: A | Discharge status: Home / Self Care | Payer: Commercial Managed Care - HMO | Source: Ambulatory Visit | Attending: Internal Medicine | Admitting: Internal Medicine

## 2013-10-08 ENCOUNTER — Other Ambulatory Visit: Payer: Self-pay | Admitting: Diagnostic Radiology

## 2013-10-08 DIAGNOSIS — R928 Other abnormal and inconclusive findings on diagnostic imaging of breast: Secondary | ICD-10-CM

## 2013-10-08 LAB — HM MAMMOGRAPHY: HM Mammogram: NORMAL

## 2013-10-09 ENCOUNTER — Other Ambulatory Visit: Payer: Self-pay | Admitting: Internal Medicine

## 2013-10-09 ENCOUNTER — Telehealth: Payer: Self-pay | Admitting: *Deleted

## 2013-10-09 ENCOUNTER — Encounter: Payer: Self-pay | Admitting: Neurology

## 2013-10-09 ENCOUNTER — Ambulatory Visit (INDEPENDENT_AMBULATORY_CARE_PROVIDER_SITE_OTHER): Payer: Commercial Managed Care - HMO | Admitting: Neurology

## 2013-10-09 VITALS — BP 142/84 | HR 81 | Resp 16 | Ht 64.0 in | Wt 174.0 lb

## 2013-10-09 DIAGNOSIS — R259 Unspecified abnormal involuntary movements: Secondary | ICD-10-CM

## 2013-10-09 DIAGNOSIS — H539 Unspecified visual disturbance: Secondary | ICD-10-CM

## 2013-10-09 DIAGNOSIS — R251 Tremor, unspecified: Secondary | ICD-10-CM

## 2013-10-09 NOTE — Telephone Encounter (Signed)
Fax# to her job [phone] fax

## 2013-10-09 NOTE — Telephone Encounter (Signed)
Note faxed.

## 2013-10-09 NOTE — Patient Instructions (Addendum)
1. Follow-up in 6 months 2. May return to work with restriction of working 3 days a week.

## 2013-10-09 NOTE — Progress Notes (Addendum)
NEUROLOGY FOLLOW UP OFFICE NOTE  Brianna Pittman [id number]  HISTORY OF PRESENT ILLNESS: I had the pleasure of seeing Brianna Pittman in follow-up in the neurology clinic on 10/09/2013.  The patient was last seen a month ago for evaluation of bitemporal hemianopia found on an optometry visit.  Her neurological exam was non-focal, no hemianopia was noted at that time, and she was referred to an ophthalmologist for formal testing.  Records from Dr. Katy Fitch were reviewed, testing with a Humphrey visual field 24-2 did not show any definite defect. It was noted that the indices were a bit unreliable, but the testing did not reveal a hemianopic defect.  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  I personally reviewed MRI brain with and without contrast which did not show any pituitary mass to cause bitemporal hemianopia.  Pituitary labs normal.  She reports that she has been doing well, vision feels better.  She is again noted to have a right hand resting tremor today, she tells me her half-brother has Parkinson's disease.  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 She reports that she started noticing vision difficulties a year ago, her vision would become blurred when she put on her glasses. She went to her optometrist in March 2015 and was given new prescriptions, however she still could not see good, reporting "everything is blurry, mostly on the left side." Due to this, she went back for re-evaluation last week, records were reviewed, per notes confrontation fields are full in all quadrants, computerized screening fields showed fields were full in all quadrants, however under Special Testing, with Humphrey Matrix perimetry testing, she was  noted to have bitemporal hemianopia. She does notice that she has to pay good attention when changing lanes, because of blurred vision mostly on the left side, but denies any loss of vision.   PAST MEDICAL HISTORY: Past Medical History  Diagnosis Date  . Hypertension   . Hyperlipidemia   . Depression with anxiety   . Chest pain   . Benign neoplasm of unspecified site   . Esophageal reflux   . Osteoarthrosis, unspecified whether generalized or localized, unspecified site   . Allergy   . Anemia   . Anxiety   . Cataract   . Ulcer   . Colon polyps   . Vitamin D deficiency   . Depression     h/o depression    MEDICATIONS: Current Outpatient Prescriptions on File Prior to Visit  Medication Sig Dispense Refill  . amLODipine (NORVASC) 10 MG tablet Take 1 tablet (10 mg total) by mouth daily.  90 tablet  0  . aspirin 81 MG tablet Take 81 mg by mouth daily.      Marland Kitchen docusate sodium (COLACE) 100 MG capsule Take 1 capsule (100 mg total) by mouth 2 (two) times daily.  60 capsule  11  . esomeprazole (NEXIUM) 20 MG capsule Take 20 mg by mouth daily with breakfast.      . famotidine (PEPCID) 20 MG tablet Take 20 mg by mouth at bedtime.      . nebivolol (BYSTOLIC) 5 MG tablet Take 5 mg by mouth daily.      . simvastatin (ZOCOR) 20 MG tablet Take 20 mg by mouth daily.      Marland Kitchen  solifenacin (VESICARE) 5 MG tablet Take 1 tablet (5 mg total) by mouth daily.  56 tablet  0   No current facility-administered medications on file prior to visit.    ALLERGIES: Allergies  Allergen Reactions  . Citrus Other (See Comments)    Upset stomach  . Ibuprofen     REACTION: itching  . Other Other (See Comments)    Coffee:severe acid reflux    FAMILY HISTORY: Family History  Problem Relation Age of Onset  . Ovarian cancer Mother   . Colon cancer Neg Hx   . Esophageal cancer Neg Hx   . Rectal cancer Neg Hx   . Stomach cancer Neg Hx   . Diabetes Father   . Diabetes Brother     x 2  . Prostate cancer  Brother   . Diabetes Maternal Grandmother   . Diabetes Other   . Diabetes Maternal Uncle   . Neurologic Disorder Neg Hx     SOCIAL HISTORY: History   Social History  . Marital Status: Single    Spouse Name: N/A    Number of Children: 3  . Years of Education: N/A   Occupational History  . Retired    Social History Main Topics  . Smoking status: Never Smoker   . Smokeless tobacco: Never Used     Comment: been around 2nd hand smoke  . Alcohol Use: No  . Drug Use: No  . Sexual Activity: No   Other Topics Concern  . Not on file   Social History Narrative  . No narrative on file    REVIEW OF SYSTEMS: Constitutional: No fevers, chills, or sweats, no generalized fatigue, change in appetite Eyes: No visual changes, double vision, eye pain Ear, nose and throat: No hearing loss, ear pain, nasal congestion, sore throat Cardiovascular: No chest pain, palpitations Respiratory:  No shortness of breath at rest or with exertion, wheezes GastrointestinaI: No nausea, vomiting, diarrhea, abdominal pain, fecal incontinence Genitourinary:  No dysuria, urinary retention or frequency Musculoskeletal:  + neck pain, back pain Integumentary: No rash, pruritus, skin lesions Neurological: as above Psychiatric: No depression, insomnia, anxiety Endocrine: No palpitations, fatigue, diaphoresis, mood swings, change in appetite, change in weight, increased thirst Hematologic/Lymphatic:  No anemia, purpura, petechiae. Allergic/Immunologic: no itchy/runny eyes, nasal congestion, recent allergic reactions, rashes  PHYSICAL EXAM: Filed Vitals:   10/09/13 1011  BP: 142/84  Pulse: 81  Resp: 16   General: No acute distress Head:  Normocephalic/atraumatic Neck: supple, no paraspinal tenderness, full range of motion Heart:  Regular rate and rhythm Lungs:  Clear to auscultation bilaterally Back: No paraspinal tenderness Skin/Extremities: No rash, no edema Neurological Exam:alert and oriented to  person, place, and time, no dysarthria or aphasia, Fund of knowledge is appropriate. Recent and remote memory are intact. Attention and concentration are normal. Able to name objects and repeat phrases.  Cranial nerves:  CN I: not tested  CN II: right pupil round reactive, left pupil irregular reactive to light; on gross testing, visual fields are intact (no bitemporal hemianopia noted on gross confrontational testing), fundi unremarkable.  CN III, IV, VI: full range of motion, no nystagmus, no ptosis  CN V: facial sensation intact  CN VII: upper and lower face symmetric  CN VIII: hearing intact to finger rub  CN IX, X: gag intact, uvula midline  CN XI: sternocleidomastoid and trapezius muscles intact  CN XII: tongue midline  Bulk & Tone: normal, mild right cogwheeling,no fasciculations.  Motor: 5/5 throughout with no pronator drift.  Sensation: intact to light touch. Romberg test negative  Deep Tendon Reflexes: some asymmetry noted over the UE, with brisk +3 right brachialis, otherwise +2 throughout except for +1 bilateral ankle jerks, no ankle clonus, negative Hoffman's sign  Plantar responses: downgoing bilaterally  Cerebellar: no incoordination on finger to nose testing  Gait: slow and cautious, no ataxia, good arm swing today, no pill rolling tremor on right hand seen on last visit Tremor: low amplitude resting tremor on right hand (mostly thumb), no postural or action tremor seen. No micrographia, good spiral drawing.  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  On my exam, there was no bitemporal hemianopia on gross confrontation testing. She was evaluated by an ophthalmologist with Humphrey visual 24-2, and no hemianopic defect was noted. MRI brain normal.  Findings were discussed  with patient, left eye symptoms likely due to possible early glaucoma, she will continue to follow-up with Dr. Katy Fitch.  She is noted to have a resting tremor on the right today, no other signs of Parkinsonism. This will continue to be monitored.  Since no hemianopic defect, no further driving restrictions. She is ready to go back to work but will benefit from reducing to 3 days a week.  She will follow-up in 6 months or earlier if needed.  Thank you for allowing me to participate in her care.  Please do not hesitate to call for any questions or concerns.  The duration of this appointment visit was 15 minutes of face-to-face time with the patient.  Greater than 50% of this time was spent in counseling, explanation of diagnosis, planning of further management, and coordination of care.   Ellouise Newer, M.D.   CC: Dr. Ronnald Ramp

## 2013-10-14 LAB — HM MAMMOGRAPHY: HM Mammogram: NORMAL

## 2013-11-04 ENCOUNTER — Encounter: Payer: Self-pay | Admitting: Gastroenterology

## 2013-11-18 ENCOUNTER — Ambulatory Visit (HOSPITAL_BASED_OUTPATIENT_CLINIC_OR_DEPARTMENT_OTHER): Payer: Commercial Managed Care - HMO

## 2013-11-18 ENCOUNTER — Ambulatory Visit (HOSPITAL_COMMUNITY)
Admission: RE | Admit: 2013-11-18 | Discharge: 2013-11-18 | Disposition: A | Discharge status: Home / Self Care | Payer: Commercial Managed Care - HMO | Source: Ambulatory Visit | Attending: Internal Medicine | Admitting: Internal Medicine

## 2013-11-18 DIAGNOSIS — M129 Arthropathy, unspecified: Secondary | ICD-10-CM | POA: Insufficient documentation

## 2013-11-18 DIAGNOSIS — D649 Anemia, unspecified: Secondary | ICD-10-CM

## 2013-11-18 DIAGNOSIS — D709 Neutropenia, unspecified: Secondary | ICD-10-CM | POA: Diagnosis present

## 2013-11-18 DIAGNOSIS — D472 Monoclonal gammopathy: Secondary | ICD-10-CM | POA: Insufficient documentation

## 2013-11-18 LAB — CBC WITH DIFFERENTIAL/PLATELET
BASO%: 0.7 % (ref 0.0–2.0)
Basophils Absolute: 0 10*3/uL (ref 0.0–0.1)
EOS%: 3.2 % (ref 0.0–7.0)
Eosinophils Absolute: 0.1 10*3/uL (ref 0.0–0.5)
HCT: 35.1 % (ref 34.8–46.6)
HGB: 11.3 g/dL — ABNORMAL LOW (ref 11.6–15.9)
LYMPH%: 40 % (ref 14.0–49.7)
MCH: 30.6 pg (ref 25.1–34.0)
MCHC: 32.1 g/dL (ref 31.5–36.0)
MCV: 95.3 fL (ref 79.5–101.0)
MONO#: 0.2 10*3/uL (ref 0.1–0.9)
MONO%: 8.5 % (ref 0.0–14.0)
NEUT#: 1.3 10*3/uL — ABNORMAL LOW (ref 1.5–6.5)
NEUT%: 47.6 % (ref 38.4–76.8)
Platelets: 181 10*3/uL (ref 145–400)
RBC: 3.69 10*6/uL — ABNORMAL LOW (ref 3.70–5.45)
RDW: 14.7 % — ABNORMAL HIGH (ref 11.2–14.5)
WBC: 2.7 10*3/uL — ABNORMAL LOW (ref 3.9–10.3)
lymph#: 1.1 10*3/uL (ref 0.9–3.3)

## 2013-11-18 LAB — COMPREHENSIVE METABOLIC PANEL (CC13)
ALT: 16 U/L (ref 0–55)
AST: 16 U/L (ref 5–34)
Albumin: 3.1 g/dL — ABNORMAL LOW (ref 3.5–5.0)
Alkaline Phosphatase: 69 U/L (ref 40–150)
Anion Gap: 4 mEq/L (ref 3–11)
BUN: 15.6 mg/dL (ref 7.0–26.0)
CO2: 25 meq/L (ref 22–29)
Calcium: 8.7 mg/dL (ref 8.4–10.4)
Chloride: 109 meq/L (ref 98–109)
Creatinine: 0.9 mg/dL (ref 0.6–1.1)
Glucose: 115 mg/dl (ref 70–140)
Potassium: 4 mEq/L (ref 3.5–5.1)
Sodium: 139 mEq/L (ref 136–145)
Total Bilirubin: 0.5 mg/dL (ref 0.20–1.20)
Total Protein: 8 g/dL (ref 6.4–8.3)

## 2013-11-19 ENCOUNTER — Telehealth: Payer: Self-pay | Admitting: Hematology

## 2013-11-19 NOTE — Telephone Encounter (Signed)
s.w. pt and advised on appt d.t change due to md on pal times given by stacy....pt ok and aware

## 2013-11-20 ENCOUNTER — Telehealth: Payer: Self-pay | Admitting: Hematology

## 2013-11-20 ENCOUNTER — Ambulatory Visit (HOSPITAL_BASED_OUTPATIENT_CLINIC_OR_DEPARTMENT_OTHER): Payer: Commercial Managed Care - HMO | Admitting: Hematology

## 2013-11-20 VITALS — BP 150/61 | HR 67 | Temp 98.6°F | Resp 20 | Ht 64.0 in | Wt 178.0 lb

## 2013-11-20 DIAGNOSIS — D649 Anemia, unspecified: Secondary | ICD-10-CM

## 2013-11-20 DIAGNOSIS — D472 Monoclonal gammopathy: Secondary | ICD-10-CM

## 2013-11-20 LAB — SPEP & IFE WITH QIG
Albumin ELP: 48.6 % — ABNORMAL LOW (ref 55.8–66.1)
Alpha-1-Globulin: 3.5 % (ref 2.9–4.9)
Alpha-2-Globulin: 9.1 % (ref 7.1–11.8)
Beta 2: 2 % — ABNORMAL LOW (ref 3.2–6.5)
Beta Globulin: 5 % (ref 4.7–7.2)
Gamma Globulin: 31.8 % — ABNORMAL HIGH (ref 11.1–18.8)
IgA: 40 mg/dL — ABNORMAL LOW (ref 69–380)
IgG (Immunoglobin G), Serum: 2500 mg/dL — ABNORMAL HIGH (ref 690–1700)
IgM, Serum: 28 mg/dL — ABNORMAL LOW (ref 52–322)
M-Spike, %: 2.22 g/dL
Total Protein, Serum Electrophoresis: 7.8 g/dL (ref 6.0–8.3)

## 2013-11-20 LAB — KAPPA/LAMBDA LIGHT CHAINS
Kappa free light chain: 1.18 mg/dL (ref 0.33–1.94)
Kappa:Lambda Ratio: 0.35 (ref 0.26–1.65)
Lambda Free Lght Chn: 3.4 mg/dL — ABNORMAL HIGH (ref 0.57–2.63)

## 2013-11-20 NOTE — Telephone Encounter (Signed)
gv adn printed appt sched and avs for pt for OCT 2016 °

## 2013-11-20 NOTE — Progress Notes (Signed)
Patient refused flu vaccine.

## 2013-11-21 ENCOUNTER — Ambulatory Visit: Payer: Medicare Other

## 2013-11-23 ENCOUNTER — Encounter: Payer: Self-pay | Admitting: Hematology

## 2013-11-23 LAB — PROTEIN, URINE, 24 HOUR
Collection Interval-UPROT: 24 h
Protein, 24H Urine: 72 mg/d (ref <150)
Protein, Urine: 8 mg/dL (ref 5–24)
Urine Total Volume-UPROT: 900 mL

## 2013-11-23 NOTE — Progress Notes (Signed)
[facility] OFFICE PROGRESS NOTE Date of Visit: 11/20/2013  [name], MD [address] [facility] [address] [place] [address]  DIAGNOSIS: MGUS (monoclonal gammopathy of unknown significance) - Plan: CBC with Differential, Comprehensive metabolic panel (Cmet) - [facility], Lactate dehydrogenase (LDH) - [facility], SPEP & IFE with QIG, Kappa/lambda light chains  Chief Complaint  Patient presents with  . Follow-up    CURRENT THERAPY: Observation for her MGUS (IgG Lambda); Iron supplementation for her mid anemia.   INTERVAL HISTORY: Brianna Pittman 73 y.o. female with a history of monoclonal gammopathy of undetermined significance (2003), neutropenia and anemia presents for follow-up.  She was last seen by Dr. Juliann Mule on 11/22/2012.  Today, she is without complaints.  She reports being up to date on her cancer screening exams.  She denies fevers or chills or acute shortness of breath.  She also denies night sweats or weight changes. Her bone marrow biopsy and aspirate done on 04/16/2000 showed 13-15% plasma cells.another bone marrow aspirate and biopsy done on 11/07/2011 showed 8% plasmacytosis. It was a hypercellular marrow with trilineage hematopoiesis. FISH panel on marrow was reported as:   She did have a right breast needle core biopsy done on 10/08/13 showing no cancer but Fat necrosis.  MEDICAL HISTORY: Past Medical History  Diagnosis Date  . Hypertension   . Hyperlipidemia   . Depression with anxiety   . Chest pain   . Benign neoplasm of unspecified site   . Esophageal reflux   . Osteoarthrosis, unspecified whether generalized or localized, unspecified site   . Allergy   . Anemia   . Anxiety   . Cataract   . Ulcer   . Colon polyps   . Vitamin D deficiency   . Depression     h/o depression    INTERIM HISTORY: has VITAMIN D DEFICIENCY; HYPERLIPIDEMIA; HYPERTENSION; GERD; OSTEOARTHRITIS; Dyspnea; Health care maintenance; MGUS (monoclonal gammopathy of unknown  significance); GERD (gastroesophageal reflux disease); Constipation; OAB (overactive bladder); Vision changes; and Other screening mammogram on her problem list.    ALLERGIES:  is allergic to citrus; ibuprofen; and other.  MEDICATIONS: has a current medication list which includes the following prescription(s): amlodipine, aspirin, esomeprazole, famotidine, nebivolol, simvastatin, and solifenacin.  SURGICAL HISTORY:  Past Surgical History  Procedure Laterality Date  . Cataracts      both eyes  . Breast reduction    . Tummy tuck    . Cesarean section      x 4  . Abdominal hysterectomy  1995    REVIEW OF SYSTEMS:   Constitutional: Denies fevers, chills or abnormal weight loss Eyes: Denies blurriness of vision Ears, nose, mouth, throat, and face: Denies mucositis or sore throat Respiratory: Denies cough, dyspnea or wheezes Cardiovascular: Denies palpitation, chest discomfort or lower extremity swelling Gastrointestinal:  Denies nausea, heartburn or change in bowel habits Skin: Denies abnormal skin rashes Lymphatics: Denies new lymphadenopathy or easy bruising Neurological:Denies numbness, tingling or new weaknesses Behavioral/Psych: Mood is stable, no new changes  All other systems were reviewed with the patient and are negative.  PHYSICAL EXAMINATION: ECOG PERFORMANCE STATUS: 0  Blood pressure 150/61, pulse 67, temperature 98.6 F (37 C), temperature source Oral, resp. rate 20, height $RemoveBe'5\' 4"'xxdNdmcIm$  (1.626 m), weight 178 lb (80.74 kg), last menstrual period 07/24/1993.  GENERAL:alert, no distress and comfortable SKIN: skin color, texture, turgor are normal, no rashes or significant lesions EYES: normal, Conjunctiva are pink and non-injected, sclera clear OROPHARYNX:no exudate, no erythema and lips, buccal mucosa,  and tongue normal  NECK: supple, thyroid normal size, non-tender, without nodularity LYMPH:  no palpable lymphadenopathy in the cervical, axillary or supraclavicular LUNGS:  clear to auscultation and percussion with normal breathing effort HEART: regular rate & rhythm and no murmurs and no lower extremity edema ABDOMEN:abdomen soft, non-tender and normal bowel sounds Musculoskeletal:no cyanosis of digits and no clubbing  NEURO: alert & oriented x 3 with fluent speech, no focal motor/sensory deficits  LABS:              RADIOGRAPHIC STUDIES:  CLINICAL DATA: Monoclonal gammopathy EXAM: METASTATIC BONE SURVEY COMPARISON: November 19, 2012 FINDINGS:  Lateral skull: No blastic or lytic bone lesions. Sella appears normal. Cervical spine: There is moderate osteoarthritic change with  narrowing most marked at C6-7. No fracture or spondylolisthesis. No appreciable blastic or lytic bone lesions. There is calcification in  the left carotid artery, stable. Chest: Lungs are clear. Heart is mildly enlarged with pulmonary vascularity within normal limits. No blastic or lytic bone lesions. No appreciable adenopathy. Thoracic spine: There is moderate generalized osteoarthritic change. No fracture or spondylolisthesis. No blastic or lytic bone lesions. Lumbar spine: No fracture or spondylolisthesis. There is moderately severe disc space narrowing at L4-5 and L5-S1. No blastic or lytic bone lesions. Pelvis: No blastic or lytic bone lesions. Mild narrowing of both hip joints, symmetric. Left femur: No blastic or lytic bone lesions. Osteoarthritic change in the left hip and knee joints noted. Left tibia and fibula: No blastic or lytic bone lesions. Right femur: No blastic or lytic bone lesions. Osteoarthritic change in the right hip and knee joints.  Right tibia and fibula: No blastic or lytic bone lesions. Left humerus: There is osteoarthritic change in the shoulder region. No blastic or lytic bone lesions. Left forearm: No blastic or lytic bone lesions. Right humerus: No blastic or lytic bone lesions. Osteoarthritic change in the right shoulder. Right forearm: No blastic or  lytic bone lesions. IMPRESSION: No blastic or lytic bone lesions. Multilevel arthropathy in the spine. There is also osteoarthritic change in hip regions, shoulders, and knees bilaterally, stable. Overall no appreciable change compared to prior study. Electronically Signed By: Lowella Grip M.D. On: 11/18/2013 11:39           ASSESSMENT: Brianna Pittman 73 y.o. 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 She had 2 bone marrow biopsies done last one in 2013 showing 8% plasma cells and 30% by CD-138 Staining which are Lambda light chain restricted. Her current blood work shows that her serum protein, calcium and creatinine are normal. White count 2.7 and hemoglobin 11.3 and platelets are normal. Kappa/Lambda ratio is normal and IgG Protein is 2500 mg stable last reading was 2600 mg. M spike is 2.22.   PLAN:  Ms. Way is a 73 year old woman with:  1. Mild chronic neutropenia. Bone marrow demonstrated adequate white blood cells. At this time no treatment is indicated from the  hematology standpoint.  2. 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 Skeletal survey unremarkable. The patient requires surveillance annually. No evidence of lesions on the survey.  3. Mild anemia. The patient informed to continue with oral iron and will have followup lab work with Dr. Nicoletta Dress.  4. Follow-up.  Patient will have a CBC, CMP, Serum light chains, QIG and IFE in one year. All questions were answered. The patient knows to call the clinic with any problems, questions or concerns. We can certainly see the patient much sooner if necessary.  I spent 25 minutes counseling the patient face to face. The total time spent in the appointment was 30 minutes.    Bernadene Bell, MD Medical Hematologist/Oncologist [place] Pager: [phone] Office No: [phone]

## 2013-11-24 LAB — UIFE/LIGHT CHAINS/TP QN, 24-HR UR
Albumin, U: DETECTED
Alpha 1, Urine: DETECTED — AB
Alpha 2, Urine: DETECTED — AB
Beta, Urine: DETECTED — AB
Gamma Globulin, Urine: DETECTED — AB
Time: 24 hours
Total Protein, Urine-Ur/day: 72 mg/d (ref <150)
Total Protein, Urine: 8 mg/dL (ref 5–24)
Volume, Urine: 900 mL

## 2014-01-09 ENCOUNTER — Other Ambulatory Visit: Payer: Self-pay | Admitting: Internal Medicine

## 2014-01-28 ENCOUNTER — Ambulatory Visit: Payer: Commercial Managed Care - HMO | Admitting: Family

## 2014-02-03 ENCOUNTER — Other Ambulatory Visit: Payer: Self-pay | Admitting: Family

## 2014-02-03 ENCOUNTER — Ambulatory Visit (INDEPENDENT_AMBULATORY_CARE_PROVIDER_SITE_OTHER): Payer: Commercial Managed Care - HMO | Admitting: Family

## 2014-02-03 ENCOUNTER — Encounter: Payer: Self-pay | Admitting: Family

## 2014-02-03 VITALS — BP 130/70 | HR 63 | Temp 98.7°F | Resp 18 | Ht 64.0 in | Wt 175.8 lb

## 2014-02-03 DIAGNOSIS — N3281 Overactive bladder: Secondary | ICD-10-CM

## 2014-02-03 DIAGNOSIS — M1990 Unspecified osteoarthritis, unspecified site: Secondary | ICD-10-CM

## 2014-02-03 DIAGNOSIS — R609 Edema, unspecified: Secondary | ICD-10-CM

## 2014-02-03 MED ORDER — SOLIFENACIN SUCCINATE 5 MG PO TABS: 5.0000 mg | ORAL_TABLET | Freq: Every day | ORAL | Status: DC

## 2014-02-03 MED ORDER — HYDROCHLOROTHIAZIDE 12.5 MG PO CAPS: 12.5000 mg | ORAL_CAPSULE | Freq: Every day | ORAL | Status: DC | PRN

## 2014-02-03 NOTE — Progress Notes (Signed)
Pre visit review using our clinic review tool, if applicable. No additional management support is needed unless otherwise documented below in the visit note. 

## 2014-02-03 NOTE — Assessment & Plan Note (Signed)
Joint pain is most likely related to her osteoarthritis. With intolerance to ibuprofen, start Aleve which patient indicates she has taken before. Continue with activity and stretching to help alleviate soreness and pain. Follow up if symptoms worsen or fail to improve.

## 2014-02-03 NOTE — Progress Notes (Signed)
Subjective:    Patient ID: Brianna Pittman, female    DOB: 09-18-1940, 73 y.o.   MRN: [id number]  Chief Complaint  Patient presents with  . Joint Pain    Has joint pain in hips, back, ankles, knee, and also has fluid build up in arms and legs    HPI:  Brianna Pittman is a 73 y.o. female who presents today for an acute visit.   Acute symptoms of joint pain in her bilateral hips, back, ankles, and knee have been going on for quite a while. Notes it mainly when the weather changes. Currently not bothering her right now, but at the worst it is around a 6/10. Has taken a medication, but does not recall what it is. Describes having difficulty holding things in her hands at times secondary to the pain.  2) Edema - has noticed edema in her lower extremities, left > right. Blood pressure is currently maintained on amlodipine.    Allergies  Allergen Reactions  . Citrus Other (See Comments)    Upset stomach  . Ibuprofen     REACTION: itching  . Other Other (See Comments)    Coffee:severe acid reflux    Current Outpatient Prescriptions on File Prior to Visit  Medication Sig Dispense Refill  . amLODipine (NORVASC) 10 MG tablet Take 1 tablet (10 mg total) by mouth daily. 90 tablet 0  . aspirin 81 MG tablet Take 81 mg by mouth daily.    Marland Kitchen esomeprazole (NEXIUM) 20 MG capsule Take 20 mg by mouth daily with breakfast.    . nebivolol (BYSTOLIC) 5 MG tablet Take 5 mg by mouth daily.    . simvastatin (ZOCOR) 20 MG tablet Take 20 mg by mouth daily.    . solifenacin (VESICARE) 5 MG tablet Take 1 tablet (5 mg total) by mouth daily. 56 tablet 0   No current facility-administered medications on file prior to visit.   Review of Systems    See HPI   Objective:    BP 130/70 mmHg  Pulse 63  Temp(Src) 98.7 F (37.1 C) (Oral)  Resp 18  Ht 5\' 4"  (1.626 m)  Wt 175 lb 12.8 oz (79.742 kg)  BMI 30.16 kg/m2  SpO2 97%  LMP 07/24/1993 Nursing note and vital signs reviewed.  Physical Exam    Constitutional: She is oriented to person, place, and time. She appears well-developed and well-nourished. No distress.  Cardiovascular: Normal rate, regular rhythm, normal heart sounds and intact distal pulses.   Pulmonary/Chest: Effort normal and breath sounds normal.  Musculoskeletal:  No obvious deformity or discoloration of any above-mentioned joints noted. Edema present in upper and lower extremities. Range of motion is intact and appropriate, limited only by soreness.  Neurological: She is alert and oriented to person, place, and time.  Skin: Skin is warm and dry.  Psychiatric: She has a normal mood and affect. Her behavior is normal. Judgment and thought content normal.       Assessment & Plan:

## 2014-02-03 NOTE — Assessment & Plan Note (Signed)
Increased edema upper and lower extremities noted. Start hydrochlorothiazide for the next 3-4 days. Then switch to daily as needed for edema.

## 2014-02-03 NOTE — Patient Instructions (Addendum)
Thank you for choosing [facility].  Summary/Instructions:  Your prescription(s) have been submitted to your pharmacy. Please take as directed and contact our office if you believe you are having problem(s) with the medication(s).  Please start taking the hydrochlorothiazide for the next 3-4 days for edema/swelling. After that he may take it on a daily basis as needed for swelling.  For your joint pain please start taking Aleve which is also known as naproxen sodium per bottle instructions.  For your bowel regimen we start taking MiraLAX on a daily basis. Continue with the Dulcolax as needed.

## 2014-02-03 NOTE — Assessment & Plan Note (Signed)
Refill Vesicare.

## 2014-02-09 ENCOUNTER — Other Ambulatory Visit: Payer: Self-pay | Admitting: Internal Medicine

## 2014-03-10 ENCOUNTER — Other Ambulatory Visit: Payer: Self-pay | Admitting: Internal Medicine

## 2014-03-11 NOTE — Telephone Encounter (Signed)
I do not see where this has been filled by you--please advise if okay to refill

## 2014-03-16 ENCOUNTER — Other Ambulatory Visit: Payer: Self-pay | Admitting: Internal Medicine

## 2014-03-16 DIAGNOSIS — K219 Gastro-esophageal reflux disease without esophagitis: Secondary | ICD-10-CM

## 2014-03-16 MED ORDER — ESOMEPRAZOLE MAGNESIUM 20 MG PO CPDR: 20.0000 mg | DELAYED_RELEASE_CAPSULE | Freq: Every day | ORAL | Status: DC

## 2014-04-09 ENCOUNTER — Other Ambulatory Visit (INDEPENDENT_AMBULATORY_CARE_PROVIDER_SITE_OTHER): Payer: Commercial Managed Care - HMO

## 2014-04-09 ENCOUNTER — Ambulatory Visit (INDEPENDENT_AMBULATORY_CARE_PROVIDER_SITE_OTHER): Payer: Commercial Managed Care - HMO | Admitting: Internal Medicine

## 2014-04-09 ENCOUNTER — Encounter: Payer: Self-pay | Admitting: Internal Medicine

## 2014-04-09 VITALS — BP 120/70 | HR 65 | Temp 98.3°F | Resp 16 | Ht 64.0 in | Wt 176.0 lb

## 2014-04-09 DIAGNOSIS — I10 Essential (primary) hypertension: Secondary | ICD-10-CM

## 2014-04-09 DIAGNOSIS — N3281 Overactive bladder: Secondary | ICD-10-CM

## 2014-04-09 DIAGNOSIS — R10812 Left upper quadrant abdominal tenderness: Secondary | ICD-10-CM | POA: Insufficient documentation

## 2014-04-09 DIAGNOSIS — D472 Monoclonal gammopathy: Secondary | ICD-10-CM

## 2014-04-09 DIAGNOSIS — R9431 Abnormal electrocardiogram [ECG] [EKG]: Secondary | ICD-10-CM

## 2014-04-09 DIAGNOSIS — Z0000 Encounter for general adult medical examination without abnormal findings: Secondary | ICD-10-CM | POA: Insufficient documentation

## 2014-04-09 DIAGNOSIS — R0600 Dyspnea, unspecified: Secondary | ICD-10-CM

## 2014-04-09 LAB — CBC WITH DIFFERENTIAL/PLATELET
Basophils Absolute: 0 10*3/uL (ref 0.0–0.1)
Basophils Relative: 0.3 % (ref 0.0–3.0)
Eosinophils Absolute: 0.1 10*3/uL (ref 0.0–0.7)
Eosinophils Relative: 2.4 % (ref 0.0–5.0)
HCT: 34.8 % — ABNORMAL LOW (ref 36.0–46.0)
Hemoglobin: 11.9 g/dL — ABNORMAL LOW (ref 12.0–15.0)
Lymphocytes Relative: 43.5 % (ref 12.0–46.0)
Lymphs Abs: 1.1 10*3/uL (ref 0.7–4.0)
MCHC: 34.1 g/dL (ref 30.0–36.0)
MCV: 92.3 fl (ref 78.0–100.0)
Monocytes Absolute: 0.3 10*3/uL (ref 0.1–1.0)
Monocytes Relative: 10.1 % (ref 3.0–12.0)
Neutro Abs: 1.1 10*3/uL — ABNORMAL LOW (ref 1.4–7.7)
Neutrophils Relative %: 43.7 % (ref 43.0–77.0)
Platelets: 181 10*3/uL (ref 150.0–400.0)
RBC: 3.77 Mil/uL — ABNORMAL LOW (ref 3.87–5.11)
RDW: 15.1 % (ref 11.5–15.5)
WBC: 2.6 10*3/uL — ABNORMAL LOW (ref 4.0–10.5)

## 2014-04-09 LAB — URINALYSIS, ROUTINE W REFLEX MICROSCOPIC
Bilirubin Urine: NEGATIVE
Hgb urine dipstick: NEGATIVE
Leukocytes, UA: NEGATIVE
Nitrite: NEGATIVE
Specific Gravity, Urine: >=1.03 — AB (ref 1.000–1.030)
Total Protein, Urine: NEGATIVE
Urine Glucose: NEGATIVE
Urobilinogen, UA: 0.2 (ref 0.0–1.0)
pH: 6 (ref 5.0–8.0)

## 2014-04-09 LAB — LIPID PANEL
Cholesterol: 199 mg/dL (ref 0–200)
HDL: 52.3 mg/dL (ref >39.00)
LDL Cholesterol: 130 mg/dL — ABNORMAL HIGH (ref 0–99)
NonHDL: 146.7
Total CHOL/HDL Ratio: 4
Triglycerides: 85 mg/dL (ref 0.0–149.0)
VLDL: 17 mg/dL (ref 0.0–40.0)

## 2014-04-09 LAB — COMPREHENSIVE METABOLIC PANEL
ALT: 13 U/L (ref 0–35)
AST: 19 U/L (ref 0–37)
Albumin: 3.7 g/dL (ref 3.5–5.2)
Alkaline Phosphatase: 77 U/L (ref 39–117)
BUN: 17 mg/dL (ref 6–23)
CO2: 28 mEq/L (ref 19–32)
Calcium: 8.8 mg/dL (ref 8.4–10.5)
Chloride: 107 mEq/L (ref 96–112)
Creatinine, Ser: 0.93 mg/dL (ref 0.40–1.20)
GFR: 76.05 mL/min (ref >60.00)
Glucose, Bld: 98 mg/dL (ref 70–99)
Potassium: 4.2 mEq/L (ref 3.5–5.1)
Sodium: 138 meq/L (ref 135–145)
Total Bilirubin: 0.4 mg/dL (ref 0.2–1.2)
Total Protein: 8.6 g/dL — ABNORMAL HIGH (ref 6.0–8.3)

## 2014-04-09 LAB — TSH: TSH: 1.77 u[IU]/mL (ref 0.35–4.50)

## 2014-04-09 MED ORDER — AMLODIPINE BESYLATE 10 MG PO TABS: 10.0000 mg | ORAL_TABLET | Freq: Every day | ORAL | Status: DC

## 2014-04-09 MED ORDER — HYDROCHLOROTHIAZIDE 12.5 MG PO CAPS: 12.5000 mg | ORAL_CAPSULE | Freq: Every day | ORAL | Status: DC | PRN

## 2014-04-09 NOTE — Assessment & Plan Note (Signed)
Urology referral at her request

## 2014-04-09 NOTE — Progress Notes (Signed)
Pre visit review using our clinic review tool, if applicable. No additional management support is needed unless otherwise documented below in the visit note. 

## 2014-04-09 NOTE — Patient Instructions (Signed)
Preventive Care for Adults A healthy lifestyle and preventive care can promote health and wellness. Preventive health guidelines for women include the following key practices.  A routine yearly physical is a good way to check with your health care provider about your health and preventive screening. It is a chance to share any concerns and updates on your health and to receive a thorough exam.  Visit your dentist for a routine exam and preventive care every 6 months. Brush your teeth twice a day and floss once a day. Good oral hygiene prevents tooth decay and gum disease.  The frequency of eye exams is based on your age, health, family medical history, use of contact lenses, and other factors. Follow your health care provider's recommendations for frequency of eye exams.  Eat a healthy diet. Foods like vegetables, fruits, whole grains, low-fat dairy products, and lean protein foods contain the nutrients you need without too many calories. Decrease your intake of foods high in solid fats, added sugars, and salt. Eat the right amount of calories for you.Get information about a proper diet from your health care provider, if necessary.  Regular physical exercise is one of the most important things you can do for your health. Most adults should get at least 150 minutes of moderate-intensity exercise (any activity that increases your heart rate and causes you to sweat) each week. In addition, most adults need muscle-strengthening exercises on 2 or more days a week.  Maintain a healthy weight. The body mass index (BMI) is a screening tool to identify possible weight problems. It provides an estimate of body fat based on height and weight. Your health care provider can find your BMI and can help you achieve or maintain a healthy weight.For adults 20 years and older:  A BMI below 18.5 is considered underweight.  A BMI of 18.5 to 24.9 is normal.  A BMI of 25 to 29.9 is considered overweight.  A BMI of  30 and above is considered obese.  Maintain normal blood lipids and cholesterol levels by exercising and minimizing your intake of saturated fat. Eat a balanced diet with plenty of fruit and vegetables. Blood tests for lipids and cholesterol should begin at age 76 and be repeated every 5 years. If your lipid or cholesterol levels are high, you are over 50, or you are at high risk for heart disease, you may need your cholesterol levels checked more frequently.Ongoing high lipid and cholesterol levels should be treated with medicines if diet and exercise are not working.  If you smoke, find out from your health care provider how to quit. If you do not use tobacco, do not start.  Lung cancer screening is recommended for adults aged 22-80 years who are at high risk for developing lung cancer because of a history of smoking. A yearly low-dose CT scan of the lungs is recommended for people who have at least a 30-pack-year history of smoking and are a current smoker or have quit within the past 15 years. A pack year of smoking is smoking an average of 1 pack of cigarettes a day for 1 year (for example: 1 pack a day for 30 years or 2 packs a day for 15 years). Yearly screening should continue until the smoker has stopped smoking for at least 15 years. Yearly screening should be stopped for people who develop a health problem that would prevent them from having lung cancer treatment.  If you are pregnant, do not drink alcohol. If you are breastfeeding,  be very cautious about drinking alcohol. If you are not pregnant and choose to drink alcohol, do not have more than 1 drink per day. One drink is considered to be 12 ounces (355 mL) of beer, 5 ounces (148 mL) of wine, or 1.5 ounces (44 mL) of liquor.  Avoid use of street drugs. Do not share needles with anyone. Ask for help if you need support or instructions about stopping the use of drugs.  High blood pressure causes heart disease and increases the risk of  stroke. Your blood pressure should be checked at least every 1 to 2 years. Ongoing high blood pressure should be treated with medicines if weight loss and exercise do not work.  If you are 3-86 years old, ask your health care provider if you should take aspirin to prevent strokes.  Diabetes screening involves taking a blood sample to check your fasting blood sugar level. This should be done once every 3 years, after age 67, if you are within normal weight and without risk factors for diabetes. Testing should be considered at a younger age or be carried out more frequently if you are overweight and have at least 1 risk factor for diabetes.  Breast cancer screening is essential preventive care for women. You should practice "breast self-awareness." This means understanding the normal appearance and feel of your breasts and may include breast self-examination. Any changes detected, no matter how small, should be reported to a health care provider. Women in their 8s and 30s should have a clinical breast exam (CBE) by a health care provider as part of a regular health exam every 1 to 3 years. After age 70, women should have a CBE every year. Starting at age 25, women should consider having a mammogram (breast X-ray test) every year. Women who have a family history of breast cancer should talk to their health care provider about genetic screening. Women at a high risk of breast cancer should talk to their health care providers about having an MRI and a mammogram every year.  Breast cancer gene (BRCA)-related cancer risk assessment is recommended for women who have family members with BRCA-related cancers. BRCA-related cancers include breast, ovarian, tubal, and peritoneal cancers. Having family members with these cancers may be associated with an increased risk for harmful changes (mutations) in the breast cancer genes BRCA1 and BRCA2. Results of the assessment will determine the need for genetic counseling and  BRCA1 and BRCA2 testing.  Routine pelvic exams to screen for cancer are no longer recommended for nonpregnant women who are considered low risk for cancer of the pelvic organs (ovaries, uterus, and vagina) and who do not have symptoms. Ask your health care provider if a screening pelvic exam is right for you.  If you have had past treatment for cervical cancer or a condition that could lead to cancer, you need Pap tests and screening for cancer for at least 20 years after your treatment. If Pap tests have been discontinued, your risk factors (such as having a new sexual partner) need to be reassessed to determine if screening should be resumed. Some women have medical problems that increase the chance of getting cervical cancer. In these cases, your health care provider may recommend more frequent screening and Pap tests.  The HPV test is an additional test that may be used for cervical cancer screening. The HPV test looks for the virus that can cause the cell changes on the cervix. The cells collected during the Pap test can be  tested for HPV. The HPV test could be used to screen women aged 57 years and older, and should be used in women of any age who have unclear Pap test results. After the age of 31, women should have HPV testing at the same frequency as a Pap test.  Colorectal cancer can be detected and often prevented. Most routine colorectal cancer screening begins at the age of 57 years and continues through age 39 years. However, your health care provider may recommend screening at an earlier age if you have risk factors for colon cancer. On a yearly basis, your health care provider may provide home test kits to check for hidden blood in the stool. Use of a small camera at the end of a tube, to directly examine the colon (sigmoidoscopy or colonoscopy), can detect the earliest forms of colorectal cancer. Talk to your health care provider about this at age 70, when routine screening begins. Direct  exam of the colon should be repeated every 5-10 years through age 25 years, unless early forms of pre-cancerous polyps or small growths are found.  People who are at an increased risk for hepatitis B should be screened for this virus. You are considered at high risk for hepatitis B if:  You were born in a country where hepatitis B occurs often. Talk with your health care provider about which countries are considered high risk.  Your parents were born in a high-risk country and you have not received a shot to protect against hepatitis B (hepatitis B vaccine).  You have HIV or AIDS.  You use needles to inject street drugs.  You live with, or have sex with, someone who has hepatitis B.  You get hemodialysis treatment.  You take certain medicines for conditions like cancer, organ transplantation, and autoimmune conditions.  Hepatitis C blood testing is recommended for all people born from [birth year] through 1965 and any individual with known risks for hepatitis C.  Practice safe sex. Use condoms and avoid high-risk sexual practices to reduce the spread of sexually transmitted infections (STIs). STIs include gonorrhea, chlamydia, syphilis, trichomonas, herpes, HPV, and human immunodeficiency virus (HIV). Herpes, HIV, and HPV are viral illnesses that have no cure. They can result in disability, cancer, and death.  You should be screened for sexually transmitted illnesses (STIs) including gonorrhea and chlamydia if:  You are sexually active and are younger than 24 years.  You are older than 24 years and your health care provider tells you that you are at risk for this type of infection.  Your sexual activity has changed since you were last screened and you are at an increased risk for chlamydia or gonorrhea. Ask your health care provider if you are at risk.  If you are at risk of being infected with HIV, it is recommended that you take a prescription medicine daily to prevent HIV infection. This is  called preexposure prophylaxis (PrEP). You are considered at risk if:  You are a heterosexual woman, are sexually active, and are at increased risk for HIV infection.  You take drugs by injection.  You are sexually active with a partner who has HIV.  Talk with your health care provider about whether you are at high risk of being infected with HIV. If you choose to begin PrEP, you should first be tested for HIV. You should then be tested every 3 months for as long as you are taking PrEP.  Osteoporosis is a disease in which the bones lose minerals and strength  with aging. This can result in serious bone fractures or breaks. The risk of osteoporosis can be identified using a bone density scan. Women ages 65 years and over and women at risk for fractures or osteoporosis should discuss screening with their health care providers. Ask your health care provider whether you should take a calcium supplement or vitamin D to reduce the rate of osteoporosis.  Menopause can be associated with physical symptoms and risks. Hormone replacement therapy is available to decrease symptoms and risks. You should talk to your health care provider about whether hormone replacement therapy is right for you.  Use sunscreen. Apply sunscreen liberally and repeatedly throughout the day. You should seek shade when your shadow is shorter than you. Protect yourself by wearing long sleeves, pants, a wide-brimmed hat, and sunglasses year round, whenever you are outdoors.  Once a month, do a whole body skin exam, using a mirror to look at the skin on your back. Tell your health care provider of new moles, moles that have irregular borders, moles that are larger than a pencil eraser, or moles that have changed in shape or color.  Stay current with required vaccines (immunizations).  Influenza vaccine. All adults should be immunized every year.  Tetanus, diphtheria, and acellular pertussis (Td, Tdap) vaccine. Pregnant women should  receive 1 dose of Tdap vaccine during each pregnancy. The dose should be obtained regardless of the length of time since the last dose. Immunization is preferred during the 27th-36th week of gestation. An adult who has not previously received Tdap or who does not know her vaccine status should receive 1 dose of Tdap. This initial dose should be followed by tetanus and diphtheria toxoids (Td) booster doses every 10 years. Adults with an unknown or incomplete history of completing a 3-dose immunization series with Td-containing vaccines should begin or complete a primary immunization series including a Tdap dose. Adults should receive a Td booster every 10 years.  Varicella vaccine. An adult without evidence of immunity to varicella should receive 2 doses or a second dose if she has previously received 1 dose. Pregnant females who do not have evidence of immunity should receive the first dose after pregnancy. This first dose should be obtained before leaving the health care facility. The second dose should be obtained 4-8 weeks after the first dose.  Human papillomavirus (HPV) vaccine. Females aged 13-26 years who have not received the vaccine previously should obtain the 3-dose series. The vaccine is not recommended for use in pregnant females. However, pregnancy testing is not needed before receiving a dose. If a female is found to be pregnant after receiving a dose, no treatment is needed. In that case, the remaining doses should be delayed until after the pregnancy. Immunization is recommended for any person with an immunocompromised condition through the age of 26 years if she did not get any or all doses earlier. During the 3-dose series, the second dose should be obtained 4-8 weeks after the first dose. The third dose should be obtained 24 weeks after the first dose and 16 weeks after the second dose.  Zoster vaccine. One dose is recommended for adults aged 60 years or older unless certain conditions are  present.  Measles, mumps, and rubella (MMR) vaccine. Adults born before 1957 generally are considered immune to measles and mumps. Adults born in 1957 or later should have 1 or more doses of MMR vaccine unless there is a contraindication to the vaccine or there is laboratory evidence of immunity to   each of the three diseases. A routine second dose of MMR vaccine should be obtained at least 28 days after the first dose for students attending postsecondary schools, health care workers, or international travelers. People who received inactivated measles vaccine or an unknown type of measles vaccine during 1963-1967 should receive 2 doses of MMR vaccine. People who received inactivated mumps vaccine or an unknown type of mumps vaccine before 1979 and are at high risk for mumps infection should consider immunization with 2 doses of MMR vaccine. For females of childbearing age, rubella immunity should be determined. If there is no evidence of immunity, females who are not pregnant should be vaccinated. If there is no evidence of immunity, females who are pregnant should delay immunization until after pregnancy. Unvaccinated health care workers born before [birth year] who lack laboratory evidence of measles, mumps, or rubella immunity or laboratory confirmation of disease should consider measles and mumps immunization with 2 doses of MMR vaccine or rubella immunization with 1 dose of MMR vaccine.  Pneumococcal 13-valent conjugate (PCV13) vaccine. When indicated, a person who is uncertain of her immunization history and has no record of immunization should receive the PCV13 vaccine. An adult aged 56 years or older who has certain medical conditions and has not been previously immunized should receive 1 dose of PCV13 vaccine. This PCV13 should be followed with a dose of pneumococcal polysaccharide (PPSV23) vaccine. The PPSV23 vaccine dose should be obtained at least 8 weeks after the dose of PCV13 vaccine. An adult aged 25  years or older who has certain medical conditions and previously received 1 or more doses of PPSV23 vaccine should receive 1 dose of PCV13. The PCV13 vaccine dose should be obtained 1 or more years after the last PPSV23 vaccine dose.  Pneumococcal polysaccharide (PPSV23) vaccine. When PCV13 is also indicated, PCV13 should be obtained first. All adults aged 69 years and older should be immunized. An adult younger than age 59 years who has certain medical conditions should be immunized. Any person who resides in a nursing home or long-term care facility should be immunized. An adult smoker should be immunized. People with an immunocompromised condition and certain other conditions should receive both PCV13 and PPSV23 vaccines. People with human immunodeficiency virus (HIV) infection should be immunized as soon as possible after diagnosis. Immunization during chemotherapy or radiation therapy should be avoided. Routine use of PPSV23 vaccine is not recommended for American Indians, [place]atives, or people younger than 65 years unless there are medical conditions that require PPSV23 vaccine. When indicated, people who have unknown immunization and have no record of immunization should receive PPSV23 vaccine. One-time revaccination 5 years after the first dose of PPSV23 is recommended for people aged 19-64 years who have chronic kidney failure, nephrotic syndrome, asplenia, or immunocompromised conditions. People who received 1-2 doses of PPSV23 before age 42 years should receive another dose of PPSV23 vaccine at age 78 years or later if at least 5 years have passed since the previous dose. Doses of PPSV23 are not needed for people immunized with PPSV23 at or after age 33 years.  Meningococcal vaccine. Adults with asplenia or persistent complement component deficiencies should receive 2 doses of quadrivalent meningococcal conjugate (MenACWY-D) vaccine. The doses should be obtained at least 2 months apart.  Microbiologists working with certain meningococcal bacteria, [employer] recruits, people at risk during an outbreak, and people who travel to or live in countries with a high rate of meningitis should be immunized. A first-year college student up through age  21 years who is living in a residence hall should receive a dose if she did not receive a dose on or after her 16th birthday. Adults who have certain high-risk conditions should receive one or more doses of vaccine.  Hepatitis A vaccine. Adults who wish to be protected from this disease, have certain high-risk conditions, work with hepatitis A-infected animals, work in hepatitis A research labs, or travel to or work in countries with a high rate of hepatitis A should be immunized. Adults who were previously unvaccinated and who anticipate close contact with an international adoptee during the first 60 days after arrival in the [place] from a country with a high rate of hepatitis A should be immunized.  Hepatitis B vaccine. Adults who wish to be protected from this disease, have certain high-risk conditions, may be exposed to blood or other infectious body fluids, are household contacts or sex partners of hepatitis B positive people, are clients or workers in certain care facilities, or travel to or work in countries with a high rate of hepatitis B should be immunized.  Haemophilus influenzae type b (Hib) vaccine. A previously unvaccinated person with asplenia or sickle cell disease or having a scheduled splenectomy should receive 1 dose of Hib vaccine. Regardless of previous immunization, a recipient of a hematopoietic stem cell transplant should receive a 3-dose series 6-12 months after her successful transplant. Hib vaccine is not recommended for adults with HIV infection. Preventive Services / Frequency Ages 64 to 68 years  Blood pressure check.** / Every 1 to 2 years.  Lipid and cholesterol check.** / Every 5 years beginning at age  22.  Clinical breast exam.** / Every 3 years for women in their 88s and 53s.  BRCA-related cancer risk assessment.** / For women who have family members with a BRCA-related cancer (breast, ovarian, tubal, or peritoneal cancers).  Pap test.** / Every 2 years from ages 90 through 51. Every 3 years starting at age 21 through age 56 or 3 with a history of 3 consecutive normal Pap tests.  HPV screening.** / Every 3 years from ages 24 through ages 1 to 46 with a history of 3 consecutive normal Pap tests.  Hepatitis C blood test.** / For any individual with known risks for hepatitis C.  Skin self-exam. / Monthly.  Influenza vaccine. / Every year.  Tetanus, diphtheria, and acellular pertussis (Tdap, Td) vaccine.** / Consult your health care provider. Pregnant women should receive 1 dose of Tdap vaccine during each pregnancy. 1 dose of Td every 10 years.  Varicella vaccine.** / Consult your health care provider. Pregnant females who do not have evidence of immunity should receive the first dose after pregnancy.  HPV vaccine. / 3 doses over 6 months, if 72 and younger. The vaccine is not recommended for use in pregnant females. However, pregnancy testing is not needed before receiving a dose.  Measles, mumps, rubella (MMR) vaccine.** / You need at least 1 dose of MMR if you were born in 1957 or later. You may also need a 2nd dose. For females of childbearing age, rubella immunity should be determined. If there is no evidence of immunity, females who are not pregnant should be vaccinated. If there is no evidence of immunity, females who are pregnant should delay immunization until after pregnancy.  Pneumococcal 13-valent conjugate (PCV13) vaccine.** / Consult your health care provider.  Pneumococcal polysaccharide (PPSV23) vaccine.** / 1 to 2 doses if you smoke cigarettes or if you have certain conditions.  Meningococcal vaccine.** /  1 dose if you are age 6 to 55 years and a [place] living in a residence hall, or have one of several medical conditions, you need to get vaccinated against meningococcal disease. You may also need additional booster doses.  Hepatitis A vaccine.** / Consult your health care provider.  Hepatitis B vaccine.** / Consult your health care provider.  Haemophilus influenzae type b (Hib) vaccine.** / Consult your health care provider. Ages 74 to 3 years  Blood pressure check.** / Every 1 to 2 years.  Lipid and cholesterol check.** / Every 5 years beginning at age 41 years.  Lung cancer screening. / Every year if you are aged 11-80 years and have a 30-pack-year history of smoking and currently smoke or have quit within the past 15 years. Yearly screening is stopped once you have quit smoking for at least 15 years or develop a health problem that would prevent you from having lung cancer treatment.  Clinical breast exam.** / Every year after age 51 years.  BRCA-related cancer risk assessment.** / For women who have family members with a BRCA-related cancer (breast, ovarian, tubal, or peritoneal cancers).  Mammogram.** / Every year beginning at age 49 years and continuing for as long as you are in good health. Consult with your health care provider.  Pap test.** / Every 3 years starting at age 52 years through age 71 or 49 years with a history of 3 consecutive normal Pap tests.  HPV screening.** / Every 3 years from ages 14 years through ages 40 to 26 years with a history of 3 consecutive normal Pap tests.  Fecal occult blood test (FOBT) of stool. / Every year beginning at age 58 years and continuing until age 63 years. You may not need to do this test if you get a colonoscopy every 10 years.  Flexible sigmoidoscopy or colonoscopy.** / Every 5 years for a flexible sigmoidoscopy or every 10 years for a colonoscopy beginning at age 53 years and continuing until age 42 years.  Hepatitis C blood test.** / For all people born from [birth year] through  1965 and any individual with known risks for hepatitis C.  Skin self-exam. / Monthly.  Influenza vaccine. / Every year.  Tetanus, diphtheria, and acellular pertussis (Tdap/Td) vaccine.** / Consult your health care provider. Pregnant women should receive 1 dose of Tdap vaccine during each pregnancy. 1 dose of Td every 10 years.  Varicella vaccine.** / Consult your health care provider. Pregnant females who do not have evidence of immunity should receive the first dose after pregnancy.  Zoster vaccine.** / 1 dose for adults aged 89 years or older.  Measles, mumps, rubella (MMR) vaccine.** / You need at least 1 dose of MMR if you were born in 1957 or later. You may also need a 2nd dose. For females of childbearing age, rubella immunity should be determined. If there is no evidence of immunity, females who are not pregnant should be vaccinated. If there is no evidence of immunity, females who are pregnant should delay immunization until after pregnancy.  Pneumococcal 13-valent conjugate (PCV13) vaccine.** / Consult your health care provider.  Pneumococcal polysaccharide (PPSV23) vaccine.** / 1 to 2 doses if you smoke cigarettes or if you have certain conditions.  Meningococcal vaccine.** / Consult your health care provider.  Hepatitis A vaccine.** / Consult your health care provider.  Hepatitis B vaccine.** / Consult your health care provider.  Haemophilus influenzae type b (Hib) vaccine.** / Consult your health care provider. Ages 33  years and over  Blood pressure check.** / Every 1 to 2 years.  Lipid and cholesterol check.** / Every 5 years beginning at age 61 years.  Lung cancer screening. / Every year if you are aged 61-80 years and have a 30-pack-year history of smoking and currently smoke or have quit within the past 15 years. Yearly screening is stopped once you have quit smoking for at least 15 years or develop a health problem that would prevent you from having lung cancer  treatment.  Clinical breast exam.** / Every year after age 46 years.  BRCA-related cancer risk assessment.** / For women who have family members with a BRCA-related cancer (breast, ovarian, tubal, or peritoneal cancers).  Mammogram.** / Every year beginning at age 24 years and continuing for as long as you are in good health. Consult with your health care provider.  Pap test.** / Every 3 years starting at age 89 years through age 49 or 55 years with 3 consecutive normal Pap tests. Testing can be stopped between 65 and 70 years with 3 consecutive normal Pap tests and no abnormal Pap or HPV tests in the past 10 years.  HPV screening.** / Every 3 years from ages 60 years through ages 31 or 87 years with a history of 3 consecutive normal Pap tests. Testing can be stopped between 65 and 70 years with 3 consecutive normal Pap tests and no abnormal Pap or HPV tests in the past 10 years.  Fecal occult blood test (FOBT) of stool. / Every year beginning at age 39 years and continuing until age 26 years. You may not need to do this test if you get a colonoscopy every 10 years.  Flexible sigmoidoscopy or colonoscopy.** / Every 5 years for a flexible sigmoidoscopy or every 10 years for a colonoscopy beginning at age 76 years and continuing until age 35 years.  Hepatitis C blood test.** / For all people born from [birth year] through 1964 and any individual with known risks for hepatitis C.  Osteoporosis screening.** / A one-time screening for women ages 43 years and over and women at risk for fractures or osteoporosis.  Skin self-exam. / Monthly.  Influenza vaccine. / Every year.  Tetanus, diphtheria, and acellular pertussis (Tdap/Td) vaccine.** / 1 dose of Td every 10 years.  Varicella vaccine.** / Consult your health care provider.  Zoster vaccine.** / 1 dose for adults aged 72 years or older.  Pneumococcal 13-valent conjugate (PCV13) vaccine.** / Consult your health care provider.  Pneumococcal  polysaccharide (PPSV23) vaccine.** / 1 dose for all adults aged 43 years and older.  Meningococcal vaccine.** / Consult your health care provider.  Hepatitis A vaccine.** / Consult your health care provider.  Hepatitis B vaccine.** / Consult your health care provider.  Haemophilus influenzae type b (Hib) vaccine.** / Consult your health care provider. ** Family history and personal history of risk and conditions may change your health care provider's recommendations. Document Released: 04/04/2001 Document Revised: 06/23/2013 Document Reviewed: 07/04/2010 [facility] Patient Information 2015 [place], [place]. This information is not intended to replace advice given to you by your health care provider. Make sure you discuss any questions you have with your health care provider. Abdominal Pain Many things can cause abdominal pain. Usually, abdominal pain is not caused by a disease and will improve without treatment. It can often be observed and treated at home. Your health care provider will do a physical exam and possibly order blood tests and X-rays to help determine the seriousness of your  pain. However, in many cases, more time must pass before a clear cause of the pain can be found. Before that point, your health care provider may not know if you need more testing or further treatment. HOME CARE INSTRUCTIONS  Monitor your abdominal pain for any changes. The following actions may help to alleviate any discomfort you are experiencing:  Only take over-the-counter or prescription medicines as directed by your health care provider.  Do not take laxatives unless directed to do so by your health care provider.  Try a clear liquid diet (broth, tea, or water) as directed by your health care provider. Slowly move to a bland diet as tolerated. SEEK MEDICAL CARE IF:  You have unexplained abdominal pain.  You have abdominal pain associated with nausea or diarrhea.  You have pain when you urinate or have  a bowel movement.  You experience abdominal pain that wakes you in the night.  You have abdominal pain that is worsened or improved by eating food.  You have abdominal pain that is worsened with eating fatty foods.  You have a fever. SEEK IMMEDIATE MEDICAL CARE IF:   Your pain does not go away within 2 hours.  You keep throwing up (vomiting).  Your pain is felt only in portions of the abdomen, such as the right side or the left lower portion of the abdomen.  You pass bloody or black tarry stools. MAKE SURE YOU:  Understand these instructions.   Will watch your condition.   Will get help right away if you are not doing well or get worse.  Document Released: 11/16/2004 Document Revised: 02/11/2013 Document Reviewed: 10/16/2012 [facility] Patient Information 2015 [place], [place]. This information is not intended to replace advice given to you by your health care provider. Make sure you discuss any questions you have with your health care provider.

## 2014-04-09 NOTE — Assessment & Plan Note (Signed)
She has ongoing CP and DOE that appears to be stable She refused another EKG today I don't think she has had a cardiac work-up so referred to cardiology for further evaluation

## 2014-04-09 NOTE — Assessment & Plan Note (Signed)
Her BP is well controlled Will check her lytes and renal function

## 2014-04-09 NOTE — Progress Notes (Signed)
Subjective:    Patient ID: Brianna Pittman, female    DOB: 05-Nov-1940, 74 y.o.   MRN: [id number]  Abdominal Pain This is a recurrent problem. Episode onset: for 3-4 months. The onset quality is gradual. The problem occurs intermittently. The problem has been unchanged. The pain is located in the LUQ. The pain is at a severity of 1/10. The pain is mild. The quality of the pain is aching. The abdominal pain does not radiate. Associated symptoms include constipation and frequency. Pertinent negatives include no anorexia, diarrhea, dysuria, fever, headaches, hematuria, melena, nausea, vomiting or weight loss. Nothing aggravates the pain. The pain is relieved by nothing. She has tried nothing for the symptoms. The treatment provided no relief.      Review of Systems  Constitutional: Negative.  Negative for fever, chills, weight loss, diaphoresis, appetite change and fatigue.  HENT: Negative.   Eyes: Negative.   Respiratory: Positive for shortness of breath (some DOE). Negative for apnea, cough, choking, chest tightness, wheezing and stridor.   Cardiovascular: Positive for chest pain (intermittent rt side, sharp CP). Negative for palpitations and leg swelling.  Gastrointestinal: Negative.  Positive for constipation. Negative for nausea, vomiting, abdominal pain, diarrhea, blood in stool, melena and anorexia.  Endocrine: Negative.   Genitourinary: Positive for frequency. Negative for dysuria, urgency, hematuria, flank pain, vaginal bleeding, enuresis, difficulty urinating, pelvic pain and dyspareunia.  Musculoskeletal: Negative.   Skin: Negative.   Allergic/Immunologic: Negative.   Neurological: Negative.  Negative for dizziness, syncope, speech difficulty, light-headedness, numbness and headaches.  Hematological: Negative.  Negative for adenopathy. Does not bruise/bleed easily.  Psychiatric/Behavioral: Negative.        Objective:   Physical Exam  Constitutional: She is oriented to person,  place, and time. She appears well-developed and well-nourished.  Non-toxic appearance. She does not have a sickly appearance. She does not appear ill. No distress.  HENT:  Head: Normocephalic and atraumatic.  Mouth/Throat: Oropharynx is clear and moist. No oropharyngeal exudate.  Eyes: Conjunctivae are normal. Right eye exhibits no discharge. Left eye exhibits no discharge. No scleral icterus.  Neck: Normal range of motion. Neck supple. No JVD present. No tracheal deviation present. No thyromegaly present.  Cardiovascular: Normal rate, regular rhythm, normal heart sounds and intact distal pulses.  Exam reveals no gallop and no friction rub.   No murmur heard. Pulmonary/Chest: Effort normal and breath sounds normal. No stridor. No respiratory distress. She has no wheezes. She has no rales. She exhibits no tenderness.  Abdominal: Soft. Normal appearance and bowel sounds are normal. She exhibits no shifting dullness, no distension, no pulsatile liver, no fluid wave, no abdominal bruit, no ascites, no pulsatile midline mass and no mass. There is no hepatosplenomegaly, splenomegaly or hepatomegaly. There is tenderness in the left upper quadrant. There is no rigidity, no rebound, no guarding, no CVA tenderness, no tenderness at McBurney's point and negative Murphy's sign. No hernia. Hernia confirmed negative in the ventral area, confirmed negative in the right inguinal area and confirmed negative in the left inguinal area.  Musculoskeletal: Normal range of motion. She exhibits no edema or tenderness.  Lymphadenopathy:    She has no cervical adenopathy.  Neurological: She is oriented to person, place, and time.  Skin: Skin is warm and dry. No rash noted. She is not diaphoretic. No erythema. No pallor.  Psychiatric: She has a normal mood and affect. Her behavior is normal. Judgment and thought content normal. Her speech is delayed and tangential. Cognition and memory are impaired. She  exhibits abnormal recent  memory and abnormal remote memory.  Nursing note and vitals reviewed.    Lab Results  Component Value Date   WBC 2.7* 11/18/2013   HGB 11.3* 11/18/2013   HCT 35.1 11/18/2013   PLT 181 11/18/2013   GLUCOSE 115 11/18/2013   CHOL 171 10/23/2012   TRIG 124.0 10/23/2012   HDL 51.10 10/23/2012   LDLDIRECT 119.5 11/09/2009   LDLCALC 95 10/23/2012   ALT 16 11/18/2013   AST 16 11/18/2013   NA 139 11/18/2013   K 4.0 11/18/2013   CL 104 08/31/2013   CREATININE 0.9 11/18/2013   BUN 15.6 11/18/2013   CO2 25 11/18/2013   TSH 1.606 09/11/2013   HGBA1C 5.8 01/11/2010       Assessment & Plan:

## 2014-04-09 NOTE — Assessment & Plan Note (Signed)
She refused al vaccines today  The patient is here for annual Medicare wellness examination and management of other chronic and acute problems.   The risk factors are reflected in the social history.  The roster of all physicians providing medical care to patient - is listed in the Snapshot section of the chart.  Activities of daily living:  The patient is 100% inedpendent in all ADLs: dressing, toileting, feeding as well as independent mobility  Home safety : The patient has smoke detectors in the home. They wear seatbelts.No firearms at home ( firearms are present in the home, kept in a safe fashion). There is no violence in the home.   There is no risks for hepatitis, STDs or HIV. There is no   history of blood transfusion. They have no travel history to infectious disease endemic areas of the world.  The patient has (has not) seen their dentist in the last six month. They have (not) seen their eye doctor in the last year. They deny (admit to) any hearing difficulty and have not had audiologic testing in the last year.  They do not  have excessive sun exposure. Discussed the need for sun protection: hats, long sleeves and use of sunscreen if there is significant sun exposure.   Diet: the importance of a healthy diet is discussed. They do have a healthy (unhealthy-high fat/fast food) diet.  The patient has a regular exercise program.  The benefits of regular aerobic exercise were discussed.  Depression screen: there are no signs or vegative symptoms of depression- irritability, change in appetite, anhedonia, sadness/tearfullness.  Cognitive assessment: the patient manages all their financial and personal affairs and is actively engaged. They could relate day,date,year and events; recalled 3/3 objects at 3 minutes; performed clock-face test normally.  The following portions of the patient's history were reviewed and updated as appropriate: allergies, current medications, past family history,  past medical history,  past surgical history, past social history  and problem list.  Vision, hearing, body mass index were assessed and reviewed.   During the course of the visit the patient was educated and counseled about appropriate screening and preventive services including : fall prevention , diabetes screening, nutrition counseling, colorectal cancer screening, and recommended immunizations.

## 2014-04-09 NOTE — Assessment & Plan Note (Signed)
Exam shows ttp but it is o/w normal Will check her labs to look for secondary causes, may order a CT based on the lab results

## 2014-04-09 NOTE — Assessment & Plan Note (Signed)
She is due for a repeat SPEP She will also continue to f/up with hematology

## 2014-04-10 ENCOUNTER — Ambulatory Visit: Payer: Commercial Managed Care - HMO | Admitting: Neurology

## 2014-04-13 ENCOUNTER — Other Ambulatory Visit: Payer: Self-pay | Admitting: Internal Medicine

## 2014-04-13 DIAGNOSIS — D472 Monoclonal gammopathy: Secondary | ICD-10-CM

## 2014-04-13 LAB — SPEP & IFE WITH QIG
Albumin ELP: 47.1 % — ABNORMAL LOW (ref 55.8–66.1)
Alpha-1-Globulin: 3.4 % (ref 2.9–4.9)
Alpha-2-Globulin: 8.2 % (ref 7.1–11.8)
Beta 2: 2.9 % — ABNORMAL LOW (ref 3.2–6.5)
Beta Globulin: 4 % — ABNORMAL LOW (ref 4.7–7.2)
Gamma Globulin: 34.4 % — ABNORMAL HIGH (ref 11.1–18.8)
IgA: 45 mg/dL — ABNORMAL LOW (ref 69–380)
IgG (Immunoglobin G), Serum: 3270 mg/dL — ABNORMAL HIGH (ref 690–1700)
IgM, Serum: 30 mg/dL — ABNORMAL LOW (ref 52–322)
M-Spike, %: 2.59 g/dL
Total Protein, Serum Electrophoresis: 8.4 g/dL — ABNORMAL HIGH (ref 6.0–8.3)

## 2014-04-22 ENCOUNTER — Telehealth: Payer: Self-pay

## 2014-04-22 NOTE — Telephone Encounter (Signed)
Patient is returning a phone call and doesn't know who called or what the call was regards to. Please cal! [phone]

## 2014-04-22 NOTE — Telephone Encounter (Signed)
Spoke with pt, advised her that I have no idea who called and that they would call her back if needed.

## 2014-05-13 ENCOUNTER — Ambulatory Visit: Payer: Commercial Managed Care - HMO | Admitting: Neurology

## 2014-05-14 ENCOUNTER — Encounter: Payer: Self-pay | Admitting: Neurology

## 2014-05-14 ENCOUNTER — Ambulatory Visit (INDEPENDENT_AMBULATORY_CARE_PROVIDER_SITE_OTHER): Payer: Commercial Managed Care - HMO | Admitting: Neurology

## 2014-05-14 VITALS — BP 110/80 | HR 64 | Ht 64.0 in | Wt 172.0 lb

## 2014-05-14 DIAGNOSIS — R251 Tremor, unspecified: Secondary | ICD-10-CM | POA: Diagnosis not present

## 2014-05-14 NOTE — Patient Instructions (Signed)
1. Continue to monitor your tremor, I'll see you back in 4 months, but call our office if symptoms change

## 2014-05-14 NOTE — Progress Notes (Signed)
NEUROLOGY FOLLOW UP OFFICE NOTE  Brianna Pittman [id number]  HISTORY OF PRESENT ILLNESS: I had the pleasure of seeing Brianna Pittman in follow-up in the neurology clinic on 05/14/2014.  The patient was last seen 7 months ago. She was initially seen for concern for bitemporal hemianopia at her optometrist's office. MRI brain normal. Ophthalmology visit did not note any hemianopia. She returns today for the resting tremor on the right hand. She feels it has worsened since this year. It affects her writing, does not affecting feeding herself. No jaw tremor, left side and legs are unaffected. She fell the other day, no injuries. She feels her vision is not as bad as before, no diplopia, dysarthria, dysphagia, neck pain, focal numbness/tingling/weakness, no anosmia, +constipation.  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 She reports that she started noticing vision difficulties a year prior, her vision would become blurred when she put on her glasses. 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 She does notice that she has to pay good attention when changing  lanes, because of blurred vision mostly on the left side, but denies any loss of vision. I personally reviewed MRI brain with and without contrast which did not show any pituitary mass to cause bitemporal  hemianopia. Pituitary labs normal. She was evaluated by Ophthalmologist Dr. Katy Fitch, testing with a Humphrey visual field 24-2 did not show any definite defect. It was noted that the indices were a bit unreliable, but the testing did not reveal a hemianopic defect. 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  She was noted to have a right hand resting tremor on previous visits, she tells me her half-brother has Parkinson's disease.  PAST MEDICAL HISTORY: Past Medical History  Diagnosis Date  . Hypertension   . Hyperlipidemia   . Depression with anxiety   . Chest pain   . Benign neoplasm of unspecified site   . Esophageal reflux   . Osteoarthrosis, unspecified whether generalized or localized, unspecified site   . Allergy   . Anemia   . Anxiety   . Cataract   . Ulcer   . Colon polyps   . Vitamin D deficiency   . Depression     h/o depression    MEDICATIONS: Current Outpatient Prescriptions on File Prior to Visit  Medication Sig Dispense Refill  . amLODipine (NORVASC) 10 MG tablet Take 1 tablet (10 mg total) by mouth daily. 90 tablet 3  . esomeprazole (NEXIUM) 20 MG capsule Take 1 capsule (20 mg total) by mouth daily with breakfast. 90 capsule 3  . hydrochlorothiazide (MICROZIDE) 12.5 MG capsule Take 1 capsule (12.5 mg total) by mouth daily as needed. For edema 90 capsule 1  . nebivolol (BYSTOLIC) 5 MG tablet Take 5 mg by mouth daily.    . simvastatin (ZOCOR) 20 MG tablet Take 20 mg by mouth daily.  No current facility-administered medications on file prior to visit.    ALLERGIES: Allergies  Allergen Reactions  . Citrus Other (See Comments)    Upset stomach  . Ibuprofen     REACTION: itching  . Other Other (See Comments)    Coffee:severe acid reflux    FAMILY HISTORY: Family History  Problem Relation Age of Onset  . Ovarian cancer Mother   . Colon cancer Neg Hx   . Esophageal cancer Neg Hx   .  Rectal cancer Neg Hx   . Stomach cancer Neg Hx   . Diabetes Father   . Diabetes Brother     x 2  . Prostate cancer Brother   . Diabetes Maternal Grandmother   . Diabetes Other   . Diabetes Maternal Uncle   . Neurologic Disorder Neg Hx     SOCIAL HISTORY: History   Social History  . Marital Status: Single    Spouse Name: N/A  . Number of Children: 3  . Years of Education: N/A   Occupational History  . Retired    Social History Main Topics  . Smoking status: Never Smoker   . Smokeless tobacco: Never Used     Comment: been around 2nd hand smoke  . Alcohol Use: No  . Drug Use: No  . Sexual Activity: No   Other Topics Concern  . Not on file   Social History Narrative    REVIEW OF SYSTEMS: Constitutional: No fevers, chills, or sweats, no generalized fatigue, change in appetite Eyes: No visual changes, double vision, eye pain Ear, nose and throat: No hearing loss, ear pain, nasal congestion, sore throat Cardiovascular: No chest pain, palpitations Respiratory:  No shortness of breath at rest or with exertion, wheezes GastrointestinaI: No nausea, vomiting, diarrhea, abdominal pain, fecal incontinence Genitourinary:  No dysuria, urinary retention or frequency Musculoskeletal:  No neck pain, back pain Integumentary: No rash, pruritus, skin lesions Neurological: as above Psychiatric: No depression, insomnia, anxiety Endocrine: No palpitations, fatigue, diaphoresis, mood swings, change in appetite, change in weight, increased thirst Hematologic/Lymphatic:  No anemia, purpura, petechiae. Allergic/Immunologic: no itchy/runny eyes, nasal congestion, recent allergic reactions, rashes  PHYSICAL EXAM: Filed Vitals:   05/14/14 1112  BP: 110/80  Pulse: 64   General: No acute distress Head: Normocephalic/atraumatic Neck: supple, no paraspinal tenderness, full range of motion Heart: Regular rate and rhythm Lungs: Clear to auscultation bilaterally Back: No paraspinal  tenderness Skin/Extremities: No rash, no edema Neurological Exam:alert and oriented to person, place, and time, no dysarthria or aphasia, Fund of knowledge is appropriate. Recent and remote memory are intact. Attention and concentration are normal. Able to name objects and repeat phrases.  Cranial nerves:  CN I: not tested  CN II: right pupil round reactive, left pupil irregular reactive to light; on gross testing, visual fields are intact, fundi unremarkable.  CN III, IV, VI: full range of motion, no nystagmus, no ptosis  CN V: facial sensation intact  CN VII: upper and lower face symmetric  CN VIII: hearing intact to finger rub  CN IX, X: gag intact, uvula midline  CN XI: sternocleidomastoid and trapezius muscles intact  CN XII: tongue midline  Bulk & Tone: normal, mild right cogwheeling (similar to prior), no fasciculations.  Motor: 5/5 throughout with no pronator drift.  Sensation: intact to light touch. Romberg test negative  Deep Tendon Reflexes: some asymmetry noted over the UE, with brisk +3 right brachialis, otherwise +2 throughout except for +1 bilateral ankle jerks, no ankle clonus, negative Hoffman's  sign (similar to prior) Plantar responses: downgoing bilaterally  Cerebellar: no incoordination on finger to nose testing  Gait: slow and cautious, no ataxia, decreased arm swing on right with pill rolling tremor Tremor: low amplitude resting tremor on right hand (mostly thumb), no postural or action tremor seen. No micrographia, good spiral drawing. No postural instability.   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 On my exam, there was no bitemporal hemianopia on gross confrontation testing. She was evaluated by an ophthalmologist with Humphrey visual 24-2,  and no hemianopic defect was noted. MRI brain normal.She continues to follow with Dr. Katy Fitch. She is now being followed in our office for the tremor, she has a resting tremor, as well as decreased arm swing with pill rolling tremor, suggestive of Parkinson's disease, no other Parkinsonian features on exam today. We discussed the option of starting Sinemet since she reports worsening of tremor, however she would like to hold off for now. She will return in 4 months but knows to call our office for any changes.   Thank you for allowing me to participate in her care.  Please do not hesitate to call for any questions or concerns.  The duration of this appointment visit was 15 minutes of face-to-face time with the patient.  Greater than 50% of this time was spent in counseling, explanation of diagnosis, planning of further management, and coordination of care.   Ellouise Newer, M.D.   CC: Dr. Ronnald Ramp

## 2014-05-20 ENCOUNTER — Ambulatory Visit: Payer: Commercial Managed Care - HMO | Admitting: Cardiology

## 2014-05-21 ENCOUNTER — Ambulatory Visit: Payer: Commercial Managed Care - HMO | Admitting: Cardiology

## 2014-05-21 ENCOUNTER — Encounter: Payer: Self-pay | Admitting: Neurology

## 2014-05-21 DIAGNOSIS — R251 Tremor, unspecified: Secondary | ICD-10-CM | POA: Insufficient documentation

## 2014-06-09 ENCOUNTER — Encounter: Payer: Self-pay | Admitting: Cardiology

## 2014-06-09 ENCOUNTER — Ambulatory Visit (INDEPENDENT_AMBULATORY_CARE_PROVIDER_SITE_OTHER): Payer: Commercial Managed Care - HMO | Admitting: Cardiology

## 2014-06-09 VITALS — BP 130/62 | HR 63 | Ht 64.0 in | Wt 174.8 lb

## 2014-06-09 DIAGNOSIS — R0600 Dyspnea, unspecified: Secondary | ICD-10-CM

## 2014-06-09 DIAGNOSIS — E785 Hyperlipidemia, unspecified: Secondary | ICD-10-CM | POA: Insufficient documentation

## 2014-06-09 DIAGNOSIS — R9431 Abnormal electrocardiogram [ECG] [EKG]: Secondary | ICD-10-CM | POA: Diagnosis not present

## 2014-06-09 DIAGNOSIS — I10 Essential (primary) hypertension: Secondary | ICD-10-CM | POA: Diagnosis not present

## 2014-06-09 DIAGNOSIS — I491 Atrial premature depolarization: Secondary | ICD-10-CM | POA: Insufficient documentation

## 2014-06-09 NOTE — Progress Notes (Signed)
Cardiology Office Note   Date:  06/09/2014  ID:  Brianna, Pittman 08/18/1940, MRN [id number]  PCP:  Sanda Linger, MD  Cardiologist:   Donato Schultz, MD       History of Present Illness: Brianna Pittman is a 74 y.o. female who presents for evaluation of chest pain and shortness of breath.  She reports that when riding a recumbent bicycle, after about 4-8 minutes she will feel more shortness of breath than usual. She also reports that when lifting objects at times, she will feel chest discomfort right of sternum that is relieved with rest. No syncope, no bleeding. She's never been a smoker, she has been around secondhand smoke however. She does occasionally have cramping in the pretibial region.   In review of records, in 2011 she had a stress echocardiogram which was low risk showing no ischemia.    Past Medical History  Diagnosis Date  . Hypertension   . Hyperlipidemia   . Depression with anxiety   . Chest pain   . Benign neoplasm of unspecified site   . Esophageal reflux   . Osteoarthrosis, unspecified whether generalized or localized, unspecified site   . Allergy   . Anemia   . Anxiety   . Cataract   . Ulcer   . Colon polyps   . Vitamin D deficiency   . Depression     h/o depression    Past Surgical History  Procedure Laterality Date  . Cataracts      both eyes  . Breast reduction    . Tummy tuck    . Cesarean section      x 4  . Abdominal hysterectomy  1995     Current Outpatient Prescriptions  Medication Sig Dispense Refill  . amLODipine (NORVASC) 10 MG tablet Take 1 tablet (10 mg total) by mouth daily. 90 tablet 3  . esomeprazole (NEXIUM) 20 MG capsule Take 1 capsule (20 mg total) by mouth daily with breakfast. 90 capsule 3  . famotidine (PEPCID) 20 MG tablet Take 20 mg by mouth at bedtime.    . hydrochlorothiazide (MICROZIDE) 12.5 MG capsule Take 1 capsule (12.5 mg total) by mouth daily as needed. For edema 90 capsule 1  . nebivolol (BYSTOLIC) 5  MG tablet Take 5 mg by mouth daily.    . simvastatin (ZOCOR) 20 MG tablet Take 20 mg by mouth daily.     No current facility-administered medications for this visit.    Allergies:   Citrus; Ibuprofen; and Other    Social History:  The patient  reports that she has never smoked. She has never used smokeless tobacco. She reports that she does not drink alcohol or use illicit drugs.   Family History:  The patient's family history includes Diabetes in her brother, father, maternal grandmother, maternal uncle, and other; Ovarian cancer in her mother; Prostate cancer in her brother. There is no history of Colon cancer, Esophageal cancer, Rectal cancer, Stomach cancer, or Neurologic Disorder. Older brother had MI.    ROS:  Please see the history of present illness.   Otherwise, review of systems are positive for none.   All other systems are reviewed and negative.    PHYSICAL EXAM: VS:  BP 130/62 mmHg  Pulse 63  Ht 5\' 4"  (1.626 m)  Wt 174 lb 12.8 oz (79.289 kg)  BMI 29.99 kg/m2  LMP 07/24/1993 , BMI Body mass index is 29.99 kg/(m^2). GEN: Well nourished, well developed, in no acute distress HEENT: normal Neck:  no JVD, carotid bruits, or masses Cardiac: RRR; no murmurs, rubs, or gallops,no edema  Respiratory:  clear to auscultation bilaterally, normal work of breathing GI: soft, nontender, nondistended, + BS MS: no deformity or atrophy Skin: warm and dry, no rash Neuro:  Strength and sensation are intact Psych: euthymic mood, full affect   EKG:  EKG is ordered today. The ekg ordered today demonstrates sinus rhythm heart rate 63 with occasional PAC. Nonspecific ST-T wave changes, T-wave inversion noted in 3, aVF, V6. Consider inferior ischemia.   Recent Labs: 04/09/2014: ALT 13; BUN 17; Creatinine 0.93; Hemoglobin 11.9*; Platelets 181.0; Potassium 4.2; Sodium 138; TSH 1.77    Lipid Panel    Component Value Date/Time   CHOL 199 04/09/2014 1104   TRIG 85.0 04/09/2014 1104   HDL  52.30 04/09/2014 1104   CHOLHDL 4 04/09/2014 1104   VLDL 17.0 04/09/2014 1104   LDLCALC 130* 04/09/2014 1104   LDLDIRECT 119.5 11/09/2009 0930      Wt Readings from Last 3 Encounters:  06/09/14 174 lb 12.8 oz (79.289 kg)  05/14/14 172 lb (78.019 kg)  04/09/14 176 lb (79.833 kg)      Other studies Reviewed: Additional studies/ records that were reviewed today includes stress echocardiogram, referral office notes, labs Review of the above records demonstrates: as above   ASSESSMENT AND PLAN:  1.  Atypical chest pain-we will go ahead and perform nuclear stress test with her abnormal EKG demonstrates possible ischemic changes in the inferior leads. I personally reviewed prior EKG from 2011 and these changes were not present. Her brother has had myocardial infarction. Differential of chest discomfort/shortness of breath includes heart disease, pulmonary condition, musculoskeletal, GERD  2. Abnormal EKG-as described above inferior lateral T-wave changes. Possible ischemia.  3. Essential hypertension-well controlled. Medications reviewed.  4. Hyperlipidemia-simvastatin. LDL 130 at last check. Tolerating medication well. On lower dose simvastatin with amlodipine concomitantly.  5. Premature atrial contractions-seen on EKG.   Current medicines are reviewed at length with the patient today.  The patient does not have concerns regarding medicines.  The following changes have been made:  no change  Labs/ tests ordered today include:   Orders Placed This Encounter  Procedures  . Myocardial Perfusion Imaging  . EKG 12-Lead  . 2D Echocardiogram with contrast     Disposition:    I will follow-up with her regarding testing.   Mathews Robinsons, MD  06/09/2014 11:04 AM    [facility] [address] [place], [place], [place]  [address] Phone: [phone]; Fax: [phone]

## 2014-06-09 NOTE — Patient Instructions (Signed)
Medication Instructions:  The current medical regimen is effective;  continue present plan and medications.  Labwork: none  Testing/Procedures: Your physician has requested that you have an echocardiogram. Echocardiography is a painless test that uses sound waves to create images of your heart. It provides your doctor with information about the size and shape of your heart and how well your heart's chambers and valves are working. This procedure takes approximately one hour. There are no restrictions for this procedure.  Your physician has requested that you have a lexiscan myoview. For further information please visit [email or web address]. Please follow instruction sheet, as given.  Follow-Up: As needed based on the results of the above testing.  Thank you for choosing [facility]!!

## 2014-06-10 ENCOUNTER — Other Ambulatory Visit: Payer: Self-pay

## 2014-06-10 MED ORDER — SIMVASTATIN 20 MG PO TABS: 20.0000 mg | ORAL_TABLET | Freq: Every day | ORAL | Status: DC

## 2014-06-10 MED ORDER — NEBIVOLOL HCL 5 MG PO TABS: 5.0000 mg | ORAL_TABLET | Freq: Every day | ORAL | Status: DC

## 2014-06-15 ENCOUNTER — Other Ambulatory Visit: Payer: Self-pay | Admitting: Geriatric Medicine

## 2014-06-15 MED ORDER — SIMVASTATIN 20 MG PO TABS: 20.0000 mg | ORAL_TABLET | Freq: Every day | ORAL | Status: DC

## 2014-06-15 MED ORDER — NEBIVOLOL HCL 5 MG PO TABS: 5.0000 mg | ORAL_TABLET | Freq: Every day | ORAL | Status: DC

## 2014-06-22 ENCOUNTER — Encounter: Payer: Self-pay | Admitting: Cardiology

## 2014-06-22 ENCOUNTER — Telehealth (HOSPITAL_COMMUNITY): Payer: Self-pay | Admitting: *Deleted

## 2014-06-22 NOTE — Telephone Encounter (Signed)
Left message on voicemail in reference to upcoming appointment scheduled for 06/23/14. Phone number given for a call back so details instructions can be given. Brianna Pittman, [name]

## 2014-06-23 ENCOUNTER — Ambulatory Visit (HOSPITAL_COMMUNITY): Payer: Commercial Managed Care - HMO | Attending: Cardiology

## 2014-06-23 ENCOUNTER — Other Ambulatory Visit: Payer: Self-pay

## 2014-06-23 ENCOUNTER — Ambulatory Visit (HOSPITAL_BASED_OUTPATIENT_CLINIC_OR_DEPARTMENT_OTHER): Payer: Commercial Managed Care - HMO

## 2014-06-23 DIAGNOSIS — I10 Essential (primary) hypertension: Secondary | ICD-10-CM

## 2014-06-23 DIAGNOSIS — R9431 Abnormal electrocardiogram [ECG] [EKG]: Secondary | ICD-10-CM

## 2014-06-23 DIAGNOSIS — R0600 Dyspnea, unspecified: Secondary | ICD-10-CM | POA: Diagnosis not present

## 2014-06-23 LAB — MYOCARDIAL PERFUSION IMAGING
LV dias vol: 91 mL
LV sys vol: 32 mL
Nuc Stress EF: 65 %
Peak HR: 76 {beats}/min
RATE: 0.27
Rest HR: 59 {beats}/min
SDS: 2
SRS: 0
SSS: 2
TID: 1.05

## 2014-06-23 MED ORDER — TECHNETIUM TC 99M SESTAMIBI GENERIC - CARDIOLITE
10.0000 | Freq: Once | INTRAVENOUS | Status: AC | PRN
  Administered 2014-06-23: 10 via INTRAVENOUS

## 2014-06-23 MED ORDER — TECHNETIUM TC 99M SESTAMIBI GENERIC - CARDIOLITE
33.0000 | Freq: Once | INTRAVENOUS | Status: AC | PRN
  Administered 2014-06-23: 33 via INTRAVENOUS

## 2014-06-23 MED ORDER — REGADENOSON 0.4 MG/5ML IV SOLN
0.4000 mg | Freq: Once | INTRAVENOUS | Status: AC
  Administered 2014-06-23: 0.4 mg via INTRAVENOUS

## 2014-07-14 ENCOUNTER — Telehealth: Payer: Self-pay | Admitting: Internal Medicine

## 2014-07-14 MED ORDER — SIMVASTATIN 20 MG PO TABS: 20.0000 mg | ORAL_TABLET | Freq: Every day | ORAL | Status: DC

## 2014-07-14 NOTE — Telephone Encounter (Signed)
Called pt no answer LMOm rx sent to [facility]...Johny Chess

## 2014-07-14 NOTE — Telephone Encounter (Signed)
Patient need refill of simvastatin 20 mg send to [facility].

## 2014-08-06 ENCOUNTER — Ambulatory Visit (INDEPENDENT_AMBULATORY_CARE_PROVIDER_SITE_OTHER): Payer: Commercial Managed Care - HMO | Admitting: Internal Medicine

## 2014-08-06 ENCOUNTER — Encounter: Payer: Self-pay | Admitting: Internal Medicine

## 2014-08-06 ENCOUNTER — Other Ambulatory Visit: Payer: Self-pay

## 2014-08-06 VITALS — BP 102/58 | HR 80 | Temp 98.4°F | Resp 16 | Ht 64.0 in | Wt 171.0 lb

## 2014-08-06 DIAGNOSIS — I10 Essential (primary) hypertension: Secondary | ICD-10-CM | POA: Diagnosis not present

## 2014-08-06 DIAGNOSIS — R9431 Abnormal electrocardiogram [ECG] [EKG]: Secondary | ICD-10-CM

## 2014-08-06 DIAGNOSIS — E785 Hyperlipidemia, unspecified: Secondary | ICD-10-CM | POA: Diagnosis not present

## 2014-08-06 DIAGNOSIS — E559 Vitamin D deficiency, unspecified: Secondary | ICD-10-CM

## 2014-08-06 DIAGNOSIS — K219 Gastro-esophageal reflux disease without esophagitis: Secondary | ICD-10-CM | POA: Diagnosis not present

## 2014-08-06 DIAGNOSIS — M159 Polyosteoarthritis, unspecified: Secondary | ICD-10-CM

## 2014-08-06 DIAGNOSIS — M150 Primary generalized (osteo)arthritis: Secondary | ICD-10-CM

## 2014-08-06 DIAGNOSIS — R1314 Dysphagia, pharyngoesophageal phase: Secondary | ICD-10-CM

## 2014-08-06 DIAGNOSIS — M8949 Other hypertrophic osteoarthropathy, multiple sites: Secondary | ICD-10-CM

## 2014-08-06 MED ORDER — SIMVASTATIN 20 MG PO TABS: 20.0000 mg | ORAL_TABLET | Freq: Every day | ORAL | Status: DC

## 2014-08-06 NOTE — Telephone Encounter (Signed)
Pt request rx for tramadol sent to [facility]. Thanks

## 2014-08-06 NOTE — Patient Instructions (Signed)

## 2014-08-06 NOTE — Progress Notes (Signed)
Subjective:  Patient ID: Brianna Pittman, female    DOB: 23-Apr-1940  Age: 74 y.o. MRN: [id number]  CC: Hypertension; Hyperlipidemia; and Osteoarthritis   HPI Brianna Pittman presents for a BP check and she complains that food gets stuck in her throat and she has trouble swallowing from time to time.  Outpatient Prescriptions Prior to Visit  Medication Sig Dispense Refill  . esomeprazole (NEXIUM) 20 MG capsule Take 1 capsule (20 mg total) by mouth daily with breakfast. 90 capsule 3  . famotidine (PEPCID) 20 MG tablet Take 20 mg by mouth at bedtime.    . nebivolol (BYSTOLIC) 5 MG tablet Take 1 tablet (5 mg total) by mouth daily. 90 tablet 3  . simvastatin (ZOCOR) 20 MG tablet Take 1 tablet (20 mg total) by mouth daily. 90 tablet 3  . amLODipine (NORVASC) 10 MG tablet Take 1 tablet (10 mg total) by mouth daily. 90 tablet 3  . hydrochlorothiazide (MICROZIDE) 12.5 MG capsule Take 1 capsule (12.5 mg total) by mouth daily as needed. For edema 90 capsule 1   No facility-administered medications prior to visit.    ROS Review of Systems  Constitutional: Negative for fever, chills, diaphoresis, appetite change and fatigue.  HENT: Positive for trouble swallowing. Negative for congestion, facial swelling, sinus pressure and voice change.   Eyes: Negative.   Respiratory: Negative.  Negative for cough, choking, chest tightness, shortness of breath and stridor.   Cardiovascular: Negative.  Negative for chest pain, palpitations and leg swelling.  Gastrointestinal: Negative.  Negative for nausea, vomiting, abdominal pain, diarrhea and constipation.  Endocrine: Negative.   Genitourinary: Negative.   Musculoskeletal: Positive for back pain. Negative for myalgias, joint swelling, arthralgias and neck pain.  Skin: Negative.  Negative for rash.  Allergic/Immunologic: Negative.   Neurological: Positive for tremors and weakness. Negative for dizziness, light-headedness, numbness and headaches.    Hematological: Negative.  Negative for adenopathy. Does not bruise/bleed easily.  Psychiatric/Behavioral: Negative.     Objective:  BP 102/58 mmHg  Pulse 80  Temp(Src) 98.4 F (36.9 C) (Oral)  Resp 16  Ht 5\' 4"  (1.626 m)  Wt 171 lb (77.565 kg)  BMI 29.34 kg/m2  LMP 07/24/1993  BP Readings from Last 3 Encounters:  08/06/14 102/58  06/09/14 130/62  05/14/14 110/80    Wt Readings from Last 3 Encounters:  08/06/14 171 lb (77.565 kg)  06/23/14 171 lb (77.565 kg)  06/09/14 174 lb 12.8 oz (79.289 kg)    Physical Exam  Constitutional: She is oriented to person, place, and time. She appears well-developed and well-nourished. No distress.  HENT:  Head: Normocephalic and atraumatic.  Mouth/Throat: Oropharynx is clear and moist. No oropharyngeal exudate.  Eyes: Conjunctivae are normal. Right eye exhibits no discharge. Left eye exhibits no discharge. No scleral icterus.  Neck: Normal range of motion. Neck supple. No JVD present. No tracheal deviation present. No thyromegaly present.  Cardiovascular: Normal rate, regular rhythm, normal heart sounds and intact distal pulses.  Exam reveals no gallop and no friction rub.   No murmur heard. Pulmonary/Chest: Effort normal and breath sounds normal. No stridor. No respiratory distress. She has no wheezes. She has no rales. She exhibits no tenderness.  Abdominal: Soft. Bowel sounds are normal. She exhibits no distension and no mass. There is no tenderness. There is no rebound and no guarding.  Musculoskeletal: Normal range of motion. She exhibits no edema or tenderness.  Lymphadenopathy:    She has no cervical adenopathy.  Neurological: She is oriented to person,  place, and time.  Skin: Skin is warm and dry. No rash noted. She is not diaphoretic. No erythema. No pallor.  Vitals reviewed.   Lab Results  Component Value Date   WBC 2.6* 04/09/2014   HGB 11.9* 04/09/2014   HCT 34.8* 04/09/2014   PLT 181.0 04/09/2014   GLUCOSE 98  04/09/2014   CHOL 199 04/09/2014   TRIG 85.0 04/09/2014   HDL 52.30 04/09/2014   LDLDIRECT 119.5 11/09/2009   LDLCALC 130* 04/09/2014   ALT 13 04/09/2014   AST 19 04/09/2014   NA 138 04/09/2014   K 4.2 04/09/2014   CL 107 04/09/2014   CREATININE 0.93 04/09/2014   BUN 17 04/09/2014   CO2 28 04/09/2014   TSH 1.77 04/09/2014   HGBA1C 5.8 01/11/2010    Dg Bone Survey Met  11/18/2013   CLINICAL DATA:  Monoclonal gammopathy  EXAM: METASTATIC BONE SURVEY  COMPARISON:  November 19, 2012  FINDINGS: Lateral skull: No blastic or lytic bone lesions. Sella appears normal.  Cervical spine: There is moderate osteoarthritic change with narrowing most marked at C6-7. No fracture or spondylolisthesis. No appreciable blastic or lytic bone lesions. There is calcification in the left carotid artery, stable.  Chest: Lungs are clear. Heart is mildly enlarged with pulmonary vascularity within normal limits. No blastic or lytic bone lesions. No appreciable adenopathy.  Thoracic spine: There is moderate generalized osteoarthritic change. No fracture or spondylolisthesis. No blastic or lytic bone lesions.  Lumbar spine: No fracture or spondylolisthesis. There is moderately severe disc space narrowing at L4-5 and L5-S1. No blastic or lytic bone lesions.  Pelvis: No blastic or lytic bone lesions. Mild narrowing of both hip joints, symmetric.  Left femur: No blastic or lytic bone lesions. Osteoarthritic change in the left hip and knee joints noted.  Left tibia and fibula:  No blastic or lytic bone lesions.  Right femur: No blastic or lytic bone lesions. Osteoarthritic change in the right hip and knee joints.  Right tibia and fibula:  No blastic or lytic bone lesions.  Left humerus: There is osteoarthritic change in the shoulder region. No blastic or lytic bone lesions.  Left forearm:  No blastic or lytic bone lesions.  Right humerus: No blastic or lytic bone lesions. Osteoarthritic change in the right shoulder.  Right  forearm:  No blastic or lytic bone lesions.  IMPRESSION: No blastic or lytic bone lesions. Multilevel arthropathy in the spine. There is also osteoarthritic change in hip regions, shoulders, and knees bilaterally, stable. Overall no appreciable change compared to prior study.   Electronically Signed   By: Bretta Bang M.D.   On: 11/18/2013 11:39    Assessment & Plan:   Leialoha was seen today for hypertension, hyperlipidemia and osteoarthritis.  Diagnoses and all orders for this visit:  Nonspecific abnormal electrocardiogram (ECG) (EKG)  Essential hypertension - her BP is well controlled  Primary osteoarthritis involving multiple joints  Hyperlipidemia with target LDL less than 130 - she has achieved her LDL goal  Vitamin D deficiency  Gastroesophageal reflux disease without esophagitis - cont nexium and pepcid Orders: -     Ambulatory referral to Gastroenterology  Dysphagia, pharyngoesophageal phase - she had a normal EGD 3 years ago, will refer back to GI for re-evaluation Orders: -     Ambulatory referral to Gastroenterology  Other orders -     simvastatin (ZOCOR) 20 MG tablet; Take 1 tablet (20 mg total) by mouth daily.   I have discontinued Ms. Skowronek's amLODipine and  hydrochlorothiazide. I am also having her maintain her esomeprazole, famotidine, nebivolol, and simvastatin.  Meds ordered this encounter  Medications  . simvastatin (ZOCOR) 20 MG tablet    Sig: Take 1 tablet (20 mg total) by mouth daily.    Dispense:  14 tablet    Refill:  0     Follow-up: Return in about 4 months (around 12/06/2014).  Sanda Linger, MD

## 2014-08-06 NOTE — Progress Notes (Signed)
Pre visit review using our clinic review tool, if applicable. No additional management support is needed unless otherwise documented below in the visit note. 

## 2014-08-10 ENCOUNTER — Telehealth: Payer: Self-pay | Admitting: Internal Medicine

## 2014-08-10 DIAGNOSIS — R251 Tremor, unspecified: Secondary | ICD-10-CM

## 2014-08-10 NOTE — Telephone Encounter (Signed)
Referral sent I am ok with the work note

## 2014-08-10 NOTE — Telephone Encounter (Signed)
Letter created and mailed out per pt request. She will see her neurologist this month.

## 2014-08-10 NOTE — Telephone Encounter (Signed)
Patient was in to see you on 6/16 and she is shaking really bad and is wanting to be tested for Parkinson's. She also is requesting a letter for work for her being out today. Please advise patient.

## 2014-08-18 ENCOUNTER — Ambulatory Visit (INDEPENDENT_AMBULATORY_CARE_PROVIDER_SITE_OTHER): Payer: Commercial Managed Care - HMO | Admitting: Neurology

## 2014-08-18 ENCOUNTER — Encounter: Payer: Self-pay | Admitting: Neurology

## 2014-08-18 VITALS — BP 122/74 | HR 72 | Resp 16 | Ht 64.0 in | Wt 174.0 lb

## 2014-08-18 DIAGNOSIS — G20 Parkinson's disease: Secondary | ICD-10-CM | POA: Diagnosis not present

## 2014-08-18 DIAGNOSIS — G20C Parkinsonism, unspecified: Secondary | ICD-10-CM | POA: Insufficient documentation

## 2014-08-18 MED ORDER — CARBIDOPA-LEVODOPA 25-100 MG PO TABS: ORAL_TABLET | ORAL | Status: DC

## 2014-08-18 NOTE — Progress Notes (Addendum)
NEUROLOGY FOLLOW UP OFFICE NOTE  Brianna Pittman [id number]  HISTORY OF PRESENT ILLNESS: I had the pleasure of seeing Brianna Pittman in follow-up in the neurology clinic on 08/18/2014.  The patient was last seen 3 months ago for resting tremor of the right hand. She requested for an earlier appointment because her daughter and other people have started to notice it as well. She has noticed a change in her handwriting. It does not affect feeding herself. There is no jaw tremor, left side is unaffected. She denies any falls but feels unsteady at times. She occasionally chokes when swallowing. She has constipation despite taking laxatives. She denies any anosmia, headaches, diplopia. 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 She has occasional dizziness. She has noticed some short-term memory problems, denies getting los driving, no missed bill payments or missed medications. No difficulties with ADLs. Her paternal half-brother has Parkinson's disease.  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 She reports that she started noticing vision difficulties a year prior, her vision would become blurred when she put on her glasses. 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 She does notice that she has to pay good attention when changing lanes, because of blurred vision mostly on the left side, but denies any loss of vision. I personally reviewed MRI brain with and without contrast which did not show any pituitary mass to cause bitemporal hemianopia. Pituitary labs normal. She was evaluated by Ophthalmologist Dr. Katy Fitch, testing with a Humphrey visual field 24-2 did not show any definite defect. It was noted that the indices were a bit unreliable, but the testing did not reveal a hemianopic defect. 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  She was noted to have a right hand resting tremor on previous visits, she tells me her half-brother has Parkinson's disease.  PAST MEDICAL HISTORY: Past Medical History  Diagnosis Date  . Hypertension   . Hyperlipidemia   . Depression with anxiety   . Chest pain   . Benign neoplasm of unspecified site   . Esophageal reflux   . Osteoarthrosis, unspecified whether generalized or localized, unspecified site   . Allergy   . Anemia   . Anxiety   . Cataract   . Ulcer   . Colon polyps   . Vitamin D deficiency   . Depression     h/o depression    MEDICATIONS: Current Outpatient Prescriptions on File Prior to Visit  Medication Sig Dispense Refill  . esomeprazole (NEXIUM) 20 MG capsule Take 1 capsule (20 mg total) by mouth daily with breakfast. 90 capsule 3  . nebivolol (BYSTOLIC) 5 MG tablet Take 1 tablet (5 mg total) by mouth daily. 90 tablet 3  . simvastatin (ZOCOR) 20 MG tablet Take 1 tablet (  20 mg total) by mouth daily. 14 tablet 0   No current facility-administered medications on file prior to visit.    ALLERGIES: Allergies  Allergen Reactions  . Citrus Other (See Comments)    Upset stomach  . Ibuprofen     REACTION: itching  . Other Other (See Comments)    Coffee:severe acid reflux    FAMILY HISTORY: Family  History  Problem Relation Age of Onset  . Ovarian cancer Mother   . Colon cancer Neg Hx   . Esophageal cancer Neg Hx   . Rectal cancer Neg Hx   . Stomach cancer Neg Hx   . Diabetes Father   . Diabetes Brother     x 2  . Prostate cancer Brother   . Diabetes Maternal Grandmother   . Diabetes Other   . Diabetes Maternal Uncle   . Neurologic Disorder Neg Hx     SOCIAL HISTORY: History   Social History  . Marital Status: Single    Spouse Name: N/A  . Number of Children: 3  . Years of Education: N/A   Occupational History  . Retired    Social History Main Topics  . Smoking status: Never Smoker   . Smokeless tobacco: Never Used     Comment: been around 2nd hand smoke  . Alcohol Use: No  . Drug Use: No  . Sexual Activity: No   Other Topics Concern  . Not on file   Social History Narrative    REVIEW OF SYSTEMS: Constitutional: No fevers, chills, or sweats, no generalized fatigue, change in appetite Eyes: No visual changes, double vision, eye pain Ear, nose and throat: No hearing loss, ear pain, nasal congestion, sore throat Cardiovascular: No chest pain, palpitations Respiratory:  No shortness of breath at rest or with exertion, wheezes GastrointestinaI: No nausea, vomiting, diarrhea, abdominal pain, fecal incontinence Genitourinary:  No dysuria, urinary retention or frequency Musculoskeletal:  No neck pain, back pain Integumentary: No rash, pruritus, skin lesions Neurological: as above Psychiatric: No depression, insomnia, anxiety Endocrine: No palpitations, fatigue, diaphoresis, mood swings, change in appetite, change in weight, increased thirst Hematologic/Lymphatic:  No anemia, purpura, petechiae. Allergic/Immunologic: no itchy/runny eyes, nasal congestion, recent allergic reactions, rashes  PHYSICAL EXAM: Filed Vitals:   08/18/14 0917  BP: 122/74  Pulse: 72  Resp: 16   General: No acute distress, +hypomimia Head:  Normocephalic/atraumatic Neck: supple,  no paraspinal tenderness, full range of motion Heart:  Regular rate and rhythm Lungs:  Clear to auscultation bilaterally Back: No paraspinal tenderness Skin/Extremities: No rash, no edema Neurological Exam:alert and oriented to person, place, and time, no dysarthria or aphasia, Fund of knowledge is appropriate. Recent and remote memory are intact. Attention and concentration are normal. Able to name objects and repeat phrases.  MMSE - Mini Mental State Exam 08/18/2014  Orientation to time 5  Orientation to Place 5  Registration 3  Attention/ Calculation 5  Recall 2  Language- name 2 objects 2  Language- repeat 1  Language- follow 3 step command 3  Language- read & follow direction 1  Write a sentence 1  Copy design 1  Total score 29   Cranial nerves:  CN I: not tested  CN II: right pupil round reactive, left pupil irregular reactive to light; on gross testing, visual fields are intact, fundi unremarkable.  CN III, IV, VI: full range of motion, no nystagmus, no ptosis  CN V: facial sensation intact  CN VII: upper and lower face symmetric  CN VIII: hearing  intact to finger rub  CN IX, X: gag intact, uvula midline  CN XI: sternocleidomastoid and trapezius muscles intact  CN XII: tongue midline  Bulk & Tone: normal, mild right cogwheeling (similar to prior), no fasciculations.  Motor: 5/5 throughout with no pronator drift.  Sensation: intact to light touch. Romberg test negative  Deep Tendon Reflexes: some asymmetry noted over the UE, with brisk +3 right brachialis, otherwise +2 throughout except for +1 bilateral ankle jerks, no ankle clonus, negative Hoffman's sign (similar to prior) Plantar responses: downgoing bilaterally  Cerebellar: no incoordination on finger to nose testing  Gait: slow and cautious, no ataxia, decreased arm swing on right with pill rolling tremor (similar to prior) Tremor: low amplitude resting tremor on right hand, no postural or action tremor  seen. No micrographia.  No postural instability.   IMPRESSION:  This is a pleasant 74 yo RH woman with a history of hypertension, hyperlipidemia, MGUS, anemia, neutropenia, overactive bladder, with right hand resting tremor as well as during ambulation with decreased arm swing, mild cogwheeling on right, suggestive of Parkinsonian tremor. She also has hypomimia, no postural instability noted. MMSE 29/30. We discussed early Parkinson's disease and starting low dose Sinemet. Side effects were discussed. She will start with 1/2 tablet before meals for now and will call our office to update on her symptoms. She has a follow-up already scheduled for next month and will keep that appointment. She knows to call our office for any changes. months.  Thank you for allowing me to participate in her care.  Please do not hesitate to call for any questions or concerns.  The duration of this appointment visit was 15 minutes of face-to-face time with the patient.  Greater than 50% of this time was spent in counseling, explanation of diagnosis, planning of further management, and coordination of care.   Ellouise Newer, M.D.   CC: Dr. Ronnald Ramp

## 2014-08-18 NOTE — Patient Instructions (Addendum)
1. Start Sinemet 25/100mg : Take 1/2 tablet before breakfast for 1 week, then increase to 1/2 tablet before breakfast and 1/2 tablet before lunch. Then call Dr. Delice Lesch to update on how you are doing. We will decide on the phone if we will stay on same dose or increase, depending on how you feel. 2. Keep follow-up next month as scheduled

## 2014-08-25 ENCOUNTER — Telehealth: Payer: Self-pay | Admitting: Neurology

## 2014-08-25 DIAGNOSIS — G20C Parkinsonism, unspecified: Secondary | ICD-10-CM

## 2014-08-25 DIAGNOSIS — G20 Parkinson's disease: Secondary | ICD-10-CM

## 2014-08-25 MED ORDER — CARBIDOPA-LEVODOPA 25-100 MG PO TABS: ORAL_TABLET | ORAL | Status: DC

## 2014-08-25 NOTE — Telephone Encounter (Signed)
Pt state that she has not started taking the medication ( she does not know the name of it ) and states that we need to call [facility] to get her the medication pt phone number is [phone]

## 2014-08-25 NOTE — Telephone Encounter (Signed)
Patient states that she hasn't received Rx for carbidopa-levodopa from [facility] mail order. I did explain to her that Rx was submitted to [facility] on 6/28 and it does take time to come in the mail and also have to consider the holiday yesterday. I told patient I would re-submit Rx just in cast [employer] didn't receive it the 1st time.

## 2014-08-25 NOTE — Telephone Encounter (Signed)
error 

## 2014-09-03 ENCOUNTER — Telehealth: Payer: Self-pay | Admitting: Neurology

## 2014-09-03 NOTE — Telephone Encounter (Signed)
Pt called to report how med "Sinemet? is making her feel call back PH# [phone]

## 2014-09-03 NOTE — Telephone Encounter (Signed)
Pls tell her to stop medication and see if symptoms improve, not quite sure this is due to medication. If symptoms continue even after stopping medication, pls have her see her PCP about the knee. Thanks

## 2014-09-03 NOTE — Telephone Encounter (Signed)
Patient state shes been taking this medication for about one week and she is having a lot of problems walking ( right knee) and breathing

## 2014-09-04 NOTE — Telephone Encounter (Signed)
Left message on machine advised patient if she has any other questions or concern please give [place]  Call back

## 2014-09-15 ENCOUNTER — Ambulatory Visit (INDEPENDENT_AMBULATORY_CARE_PROVIDER_SITE_OTHER): Payer: Commercial Managed Care - HMO | Admitting: Neurology

## 2014-09-15 ENCOUNTER — Encounter: Payer: Self-pay | Admitting: Neurology

## 2014-09-15 VITALS — BP 160/82 | HR 66 | Resp 16 | Ht 64.0 in | Wt 174.0 lb

## 2014-09-15 DIAGNOSIS — G20 Parkinson's disease: Secondary | ICD-10-CM | POA: Diagnosis not present

## 2014-09-15 DIAGNOSIS — G20C Parkinsonism, unspecified: Secondary | ICD-10-CM

## 2014-09-15 NOTE — Progress Notes (Signed)
NEUROLOGY FOLLOW UP OFFICE NOTE  Brianna Pittman [id number]  HISTORY OF PRESENT ILLNESS: I had the pleasure of seeing Brianna Pittman in follow-up in the neurology clinic on 09/15/2014.  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 She was started on low dose Sinemet and called our office 2 weeks ago to report right knee pain and difficulty breathing, and was concerned this was due to Sinemet. She was advised to stop the medication, last dose 1 week ago. She continues to feel shoulder pain going across her back, knee pain, and fatigue where she has difficulty lifting anything heavy. She now reports that these symptoms have actually been going on for several years and are not new.   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 She reports that she started noticing vision difficulties a year prior, her vision would become blurred when she put on her glasses. 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 She does notice that she has to pay good attention when changing lanes, because of blurred vision mostly on the left side, but denies any loss of vision. I personally reviewed MRI brain with and without contrast which did not show any pituitary mass to cause bitemporal hemianopia. Pituitary labs normal. She was evaluated by  Ophthalmologist Dr. Katy Fitch, testing with a Humphrey visual field 24-2 did not show any definite defect. It was noted that the indices were a bit unreliable, but the testing did not reveal a hemianopic defect. 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  She was noted to have a right hand resting tremor on previous visits, she tells me her half-brother has Parkinson's disease. She has noticed a change in her handwriting. It does not affect feeding herself. There is no jaw tremor, left side is unaffected. She denies any falls but feels unsteady at times. She occasionally chokes when swallowing. She has constipation despite taking laxatives. She denies any anosmia, headaches, diplopia.   PAST MEDICAL HISTORY: Past Medical History  Diagnosis Date  . Hypertension   . Hyperlipidemia   . Depression with anxiety   . Chest pain   . Benign neoplasm of unspecified site   . Esophageal reflux   . Osteoarthrosis, unspecified whether generalized or localized, unspecified site   . Allergy   . Anemia   . Anxiety   . Cataract   . Ulcer   . Colon polyps   . Vitamin D deficiency   . Depression     h/o depression    MEDICATIONS: Current Outpatient Prescriptions on File Prior to Visit  Medication Sig Dispense Refill  . aspirin 81 MG tablet Take 81 mg by mouth daily.    Marland Kitchen esomeprazole (NEXIUM) 20 MG capsule Take 1 capsule (20 mg total) by mouth daily with breakfast. 90 capsule 3  . nebivolol (BYSTOLIC) 5 MG  tablet Take 1 tablet (5 mg total) by mouth daily. 90 tablet 3  . simvastatin (ZOCOR) 20 MG tablet Take 1 tablet (20 mg total) by mouth daily. 14 tablet 0  . carbidopa-levodopa (SINEMET IR) 25-100 MG per tablet Take 1/2 tablet before breakfast, then 1/2 tablet before lunch (Patient not taking: Reported on 09/15/2014) 90 tablet 0   No current facility-administered medications on file prior to visit.    ALLERGIES: Allergies  Allergen  Reactions  . Citrus Other (See Comments)    Upset stomach  . Ibuprofen     REACTION: itching  . Other Other (See Comments)    Coffee:severe acid reflux    FAMILY HISTORY: Family History  Problem Relation Age of Onset  . Ovarian cancer Mother   . Colon cancer Neg Hx   . Esophageal cancer Neg Hx   . Rectal cancer Neg Hx   . Stomach cancer Neg Hx   . Diabetes Father   . Diabetes Brother     x 2  . Prostate cancer Brother   . Diabetes Maternal Grandmother   . Diabetes Other   . Diabetes Maternal Uncle   . Neurologic Disorder Neg Hx     SOCIAL HISTORY: History   Social History  . Marital Status: Single    Spouse Name: N/A  . Number of Children: 3  . Years of Education: N/A   Occupational History  . Retired    Social History Main Topics  . Smoking status: Never Smoker   . Smokeless tobacco: Never Used     Comment: been around 2nd hand smoke  . Alcohol Use: No  . Drug Use: No  . Sexual Activity: No   Other Topics Concern  . Not on file   Social History Narrative    REVIEW OF SYSTEMS: Constitutional: No fevers, chills, or sweats, + generalized fatigue, change in appetite Eyes: No visual changes, double vision, eye pain Ear, nose and throat: No hearing loss, ear pain, nasal congestion, sore throat Cardiovascular: No chest pain, palpitations Respiratory:  No shortness of breath at rest or with exertion, wheezes GastrointestinaI: No nausea, vomiting, diarrhea, abdominal pain, fecal incontinence Genitourinary:  No dysuria, urinary retention or frequency Musculoskeletal:  No neck pain, back pain, +shoulder and bilateral knee pain Integumentary: No rash, pruritus, skin lesions Neurological: as above Psychiatric: No depression, insomnia, anxiety Endocrine: No palpitations, fatigue, diaphoresis, mood swings, change in appetite, change in weight, increased thirst Hematologic/Lymphatic:  No anemia, purpura, petechiae. Allergic/Immunologic: no itchy/runny eyes, nasal  congestion, recent allergic reactions, rashes  PHYSICAL EXAM: Filed Vitals:   09/15/14 1055  BP: 160/82  Pulse: 66  Resp: 16   General: No acute distress, hypomimia Head:  Normocephalic/atraumatic Neck: supple, no paraspinal tenderness, full range of motion Heart:  Regular rate and rhythm Lungs:  Clear to auscultation bilaterally Back: No paraspinal tenderness Skin/Extremities: No rash, no edema Neurological Exam: alert and oriented to person, place, and time. No aphasia or dysarthria. Fund of knowledge is appropriate.  Recent and remote memory are intact.  Attention and concentration are normal.    Able to name objects and repeat phrases. Cranial nerves: Pupils equal, round, reactive to light.  Fundoscopic exam unremarkable, no papilledema. Extraocular movements intact with no nystagmus. Visual fields full. Facial sensation intact. No facial asymmetry. Tongue, uvula, palate midline.  Motor: Bulk normal, +mild cogwheeling on right, muscle strength 5/5 throughout with no pronator drift.  Sensation to light touch intact.  No extinction to double simultaneous stimulation.  Deep tendon  reflexes 2+ throughout, toes downgoing.  Finger to nose testing intact.  Gait narrow-based and steady, able to tandem walk adequately. Note of pill rolling tremor of right hand with ambulation.  Romberg negative. +resting tremor of right hand, no postural or action tremor noted today. No postural instability.  IMPRESSION: This is a pleasant 74 yo RH woman with a history of hypertension, hyperlipidemia, MGUS, anemia, neutropenia, overactive bladder, with right hand resting tremor as well as during ambulation with decreased arm swing, mild cogwheeling on right, suggestive of Parkinsonian tremor. She also has hypomimia, no postural instability noted. She was concerned about Sinemet causing shoulder and knee pain, but with discontinuation of medication, symptoms continue, and she reports these have been ongoing for years.  She feels generalized fatigue and feels she cannot lift heavy things, motor exam normal. She was advised to speak to her PCP about these symptoms, and we discussed that these are not due to Sinemet. She will restart Sinemet 25/100mg  1/2 tab before breakfast and lunch for 1 week, then increase to 1 tab before breakfast and lunch. She will follow-up in 2 months and knows to call our office for any changes. Months.  Thank you for allowing me to participate in her care.  Please do not hesitate to call for any questions or concerns.  The duration of this appointment visit was 14 minutes of face-to-face time with the patient.  Greater than 50% of this time was spent in counseling, explanation of diagnosis, planning of further management, and coordination of care.   Ellouise Newer, M.D.   CC: Dr. Ronnald Ramp

## 2014-09-15 NOTE — Patient Instructions (Addendum)
1. Restart Sinemet 25/100mg : Take 1/2 tablet before breakfast, 1/2 tablet before lunch for 1 week, then increase to 1 tablet before breakfast, 1 tablet before lunch and continue 2. Discuss symptoms (fatigue, shoulder and knee pain) with your PCP 3. Follow-up on 11/26/14, call for any problems

## 2014-09-24 ENCOUNTER — Ambulatory Visit (INDEPENDENT_AMBULATORY_CARE_PROVIDER_SITE_OTHER): Payer: Commercial Managed Care - HMO | Admitting: Internal Medicine

## 2014-09-24 ENCOUNTER — Encounter: Payer: Self-pay | Admitting: Internal Medicine

## 2014-09-24 VITALS — BP 124/68 | HR 66 | Temp 98.4°F | Resp 16 | Ht 64.0 in | Wt 173.0 lb

## 2014-09-24 DIAGNOSIS — M4802 Spinal stenosis, cervical region: Secondary | ICD-10-CM | POA: Insufficient documentation

## 2014-09-24 DIAGNOSIS — I10 Essential (primary) hypertension: Secondary | ICD-10-CM

## 2014-09-24 DIAGNOSIS — I491 Atrial premature depolarization: Secondary | ICD-10-CM

## 2014-09-24 DIAGNOSIS — M8949 Other hypertrophic osteoarthropathy, multiple sites: Secondary | ICD-10-CM

## 2014-09-24 DIAGNOSIS — M159 Polyosteoarthritis, unspecified: Secondary | ICD-10-CM

## 2014-09-24 DIAGNOSIS — M150 Primary generalized (osteo)arthritis: Secondary | ICD-10-CM | POA: Diagnosis not present

## 2014-09-24 NOTE — Patient Instructions (Signed)

## 2014-09-24 NOTE — Progress Notes (Signed)
Pre visit review using our clinic review tool, if applicable. No additional management support is needed unless otherwise documented below in the visit note. 

## 2014-09-25 NOTE — Progress Notes (Signed)
Subjective:  Patient ID: Brianna Pittman, female    DOB: 11/04/1940  Age: 74 y.o. MRN: [id number]  CC: Neck Pain   HPI Brianna Pittman presents for the complaint of diffuse neck pain for several years. She describes an achy sensation in her neck that radiates towards her shoulders and her chest. She has been given tramadol but doesn't like to take it. She recently had a bone survey that showed that there was a lot of degenerative disease in her cervical spine. She is not willing to take anti-inflammatories because of a previous issue with ibuprofen.  Outpatient Prescriptions Prior to Visit  Medication Sig Dispense Refill  . aspirin 81 MG tablet Take 81 mg by mouth daily.    . carbidopa-levodopa (SINEMET IR) 25-100 MG per tablet Take 1/2 tablet before breakfast, then 1/2 tablet before lunch 90 tablet 0  . esomeprazole (NEXIUM) 20 MG capsule Take 1 capsule (20 mg total) by mouth daily with breakfast. 90 capsule 3  . nebivolol (BYSTOLIC) 5 MG tablet Take 1 tablet (5 mg total) by mouth daily. 90 tablet 3  . simvastatin (ZOCOR) 20 MG tablet Take 1 tablet (20 mg total) by mouth daily. 14 tablet 0   No facility-administered medications prior to visit.    ROS Review of Systems  Constitutional: Negative.  Negative for fever, chills, diaphoresis and fatigue.  HENT: Negative.  Negative for sinus pressure and trouble swallowing.   Eyes: Negative.   Respiratory: Negative.  Negative for cough, choking, chest tightness and shortness of breath.   Cardiovascular: Negative.  Negative for chest pain, palpitations and leg swelling.  Gastrointestinal: Negative.  Negative for nausea, vomiting, abdominal pain, diarrhea, constipation and blood in stool.  Endocrine: Negative.   Genitourinary: Negative.   Musculoskeletal: Positive for arthralgias, neck pain and neck stiffness. Negative for myalgias, back pain, joint swelling and gait problem.  Skin: Negative.   Allergic/Immunologic: Negative.   Neurological:  Positive for tremors. Negative for dizziness, weakness, light-headedness, numbness and headaches.  Hematological: Negative.  Negative for adenopathy. Does not bruise/bleed easily.  Psychiatric/Behavioral: Negative.     Objective:  BP 124/68 mmHg  Pulse 66  Temp(Src) 98.4 F (36.9 C) (Oral)  Resp 16  Ht 5\' 4"  (1.626 m)  Wt 173 lb (78.472 kg)  BMI 29.68 kg/m2  SpO2 96%  LMP 07/24/1993  BP Readings from Last 3 Encounters:  09/24/14 124/68  09/15/14 160/82  08/18/14 122/74    Wt Readings from Last 3 Encounters:  09/24/14 173 lb (78.472 kg)  09/15/14 174 lb (78.926 kg)  08/18/14 174 lb (78.926 kg)    Physical Exam  Constitutional: She is oriented to person, place, and time. No distress.  HENT:  Mouth/Throat: Oropharynx is clear and moist. No oropharyngeal exudate.  Eyes: Conjunctivae are normal. Right eye exhibits no discharge. Left eye exhibits no discharge. No scleral icterus.  Neck: Normal range of motion. Neck supple. No JVD present. No tracheal deviation present. No thyromegaly present.  Cardiovascular: Normal rate, regular rhythm, normal heart sounds and intact distal pulses.  Exam reveals no gallop and no friction rub.   No murmur heard. Pulmonary/Chest: Effort normal and breath sounds normal. No stridor. No respiratory distress. She has no wheezes. She has no rales. She exhibits no tenderness.  Abdominal: Soft. Bowel sounds are normal. She exhibits no distension and no mass. There is no tenderness. There is no rebound and no guarding.  Musculoskeletal: Normal range of motion. She exhibits no edema or tenderness.  Cervical back: Normal. She exhibits normal range of motion, no tenderness, no bony tenderness, no swelling, no edema, no deformity, no laceration, no pain, no spasm and normal pulse.  Lymphadenopathy:    She has no cervical adenopathy.  Neurological: She is oriented to person, place, and time. She has normal strength. She displays tremor. She displays no  atrophy and normal reflexes. No cranial nerve deficit or sensory deficit. She exhibits normal muscle tone. She displays a negative Romberg sign. She displays no seizure activity. Coordination and gait normal.  Reflex Scores:      Tricep reflexes are 1+ on the right side and 1+ on the left side.      Bicep reflexes are 1+ on the right side and 1+ on the left side.      Brachioradialis reflexes are 1+ on the right side and 1+ on the left side.      Patellar reflexes are 1+ on the right side and 1+ on the left side.      Achilles reflexes are 1+ on the right side and 1+ on the left side. Skin: Skin is warm. No rash noted. She is not diaphoretic. No erythema. No pallor.  Vitals reviewed.   Lab Results  Component Value Date   WBC 2.6* 04/09/2014   HGB 11.9* 04/09/2014   HCT 34.8* 04/09/2014   PLT 181.0 04/09/2014   GLUCOSE 98 04/09/2014   CHOL 199 04/09/2014   TRIG 85.0 04/09/2014   HDL 52.30 04/09/2014   LDLDIRECT 119.5 11/09/2009   LDLCALC 130* 04/09/2014   ALT 13 04/09/2014   AST 19 04/09/2014   NA 138 04/09/2014   K 4.2 04/09/2014   CL 107 04/09/2014   CREATININE 0.93 04/09/2014   BUN 17 04/09/2014   CO2 28 04/09/2014   TSH 1.77 04/09/2014   HGBA1C 5.8 01/11/2010    Dg Bone Survey Met  11/18/2013   CLINICAL DATA:  Monoclonal gammopathy  EXAM: METASTATIC BONE SURVEY  COMPARISON:  November 19, 2012  FINDINGS: Lateral skull: No blastic or lytic bone lesions. Sella appears normal.  Cervical spine: There is moderate osteoarthritic change with narrowing most marked at C6-7. No fracture or spondylolisthesis. No appreciable blastic or lytic bone lesions. There is calcification in the left carotid artery, stable.  Chest: Lungs are clear. Heart is mildly enlarged with pulmonary vascularity within normal limits. No blastic or lytic bone lesions. No appreciable adenopathy.  Thoracic spine: There is moderate generalized osteoarthritic change. No fracture or spondylolisthesis. No blastic or  lytic bone lesions.  Lumbar spine: No fracture or spondylolisthesis. There is moderately severe disc space narrowing at L4-5 and L5-S1. No blastic or lytic bone lesions.  Pelvis: No blastic or lytic bone lesions. Mild narrowing of both hip joints, symmetric.  Left femur: No blastic or lytic bone lesions. Osteoarthritic change in the left hip and knee joints noted.  Left tibia and fibula:  No blastic or lytic bone lesions.  Right femur: No blastic or lytic bone lesions. Osteoarthritic change in the right hip and knee joints.  Right tibia and fibula:  No blastic or lytic bone lesions.  Left humerus: There is osteoarthritic change in the shoulder region. No blastic or lytic bone lesions.  Left forearm:  No blastic or lytic bone lesions.  Right humerus: No blastic or lytic bone lesions. Osteoarthritic change in the right shoulder.  Right forearm:  No blastic or lytic bone lesions.  IMPRESSION: No blastic or lytic bone lesions. Multilevel arthropathy in the spine. There is  also osteoarthritic change in hip regions, shoulders, and knees bilaterally, stable. Overall no appreciable change compared to prior study.   Electronically Signed   By: Bretta Bang M.D.   On: 11/18/2013 11:39    Assessment & Plan:   Jamilah was seen today for neck pain.  Diagnoses and all orders for this visit:  Spinal stenosis in cervical region- she is not willing to start an anti-inflammatory, will continue tramadol as needed for pain. I have asked her to be seen by pain management for further evaluation. Orders: -     Ambulatory referral to Pain Clinic  Essential hypertension- her blood pressure is well-controlled.  PAC (premature atrial contraction)- she does not report any palpitations, will continue the beta blocker therapy.  Primary osteoarthritis involving multiple joints- continue tramadol as needed.   I am having Ms. Mcconahy maintain her esomeprazole, nebivolol, simvastatin, aspirin, carbidopa-levodopa, traMADol,  and famotidine.  Meds ordered this encounter  Medications  . traMADol (ULTRAM) 50 MG tablet    Sig: Take by mouth every 6 (six) hours as needed.  . famotidine (PEPCID) 20 MG tablet    Sig: Take 20 mg by mouth at bedtime.     Follow-up: Return in about 3 months (around 12/25/2014).  Sanda Linger, MD

## 2014-10-02 ENCOUNTER — Telehealth: Payer: Self-pay | Admitting: *Deleted

## 2014-10-02 DIAGNOSIS — E785 Hyperlipidemia, unspecified: Secondary | ICD-10-CM

## 2014-10-02 NOTE — Telephone Encounter (Signed)
Received call pt states she is wanting to have her simvastatin change to Ezetrol 10 mg saw md on last thurs and forgot to get prescription. Inform pt md is out of office today, but will give her a call back once he responds...Johny Chess

## 2014-10-03 MED ORDER — EZETIMIBE 10 MG PO TABS: 10.0000 mg | ORAL_TABLET | Freq: Every day | ORAL | Status: DC

## 2014-10-03 NOTE — Telephone Encounter (Signed)
done

## 2014-10-05 MED ORDER — EZETIMIBE 10 MG PO TABS: 10.0000 mg | ORAL_TABLET | Freq: Every day | ORAL | Status: DC

## 2014-10-05 NOTE — Addendum Note (Signed)
Addended by: Earnstine Regal on: 10/05/2014 08:58 AM   Modules accepted: Orders

## 2014-10-05 NOTE — Telephone Encounter (Signed)
Notified pt with md response. Pt is wanting a 30 day sent to [facility] so she can start. Inform will send...Johny Chess

## 2014-10-16 ENCOUNTER — Emergency Department (HOSPITAL_COMMUNITY)
Admission: EM | Admit: 2014-10-16 | Discharge: 2014-10-16 | Disposition: A | Discharge status: Home / Self Care | Payer: Commercial Managed Care - HMO | Attending: Emergency Medicine | Admitting: Emergency Medicine

## 2014-10-16 ENCOUNTER — Encounter (HOSPITAL_COMMUNITY): Payer: Self-pay | Admitting: Emergency Medicine

## 2014-10-16 DIAGNOSIS — Z86010 Personal history of colonic polyps: Secondary | ICD-10-CM | POA: Diagnosis not present

## 2014-10-16 DIAGNOSIS — K219 Gastro-esophageal reflux disease without esophagitis: Secondary | ICD-10-CM | POA: Diagnosis not present

## 2014-10-16 DIAGNOSIS — S4992XA Unspecified injury of left shoulder and upper arm, initial encounter: Secondary | ICD-10-CM | POA: Diagnosis not present

## 2014-10-16 DIAGNOSIS — Y998 Other external cause status: Secondary | ICD-10-CM | POA: Insufficient documentation

## 2014-10-16 DIAGNOSIS — S199XXA Unspecified injury of neck, initial encounter: Secondary | ICD-10-CM | POA: Insufficient documentation

## 2014-10-16 DIAGNOSIS — Y92410 Unspecified street and highway as the place of occurrence of the external cause: Secondary | ICD-10-CM | POA: Insufficient documentation

## 2014-10-16 DIAGNOSIS — F329 Major depressive disorder, single episode, unspecified: Secondary | ICD-10-CM | POA: Insufficient documentation

## 2014-10-16 DIAGNOSIS — I10 Essential (primary) hypertension: Secondary | ICD-10-CM | POA: Diagnosis not present

## 2014-10-16 DIAGNOSIS — Z79899 Other long term (current) drug therapy: Secondary | ICD-10-CM | POA: Diagnosis not present

## 2014-10-16 DIAGNOSIS — F418 Other specified anxiety disorders: Secondary | ICD-10-CM | POA: Diagnosis not present

## 2014-10-16 DIAGNOSIS — Z7982 Long term (current) use of aspirin: Secondary | ICD-10-CM | POA: Insufficient documentation

## 2014-10-16 DIAGNOSIS — M1990 Unspecified osteoarthritis, unspecified site: Secondary | ICD-10-CM | POA: Insufficient documentation

## 2014-10-16 DIAGNOSIS — Z86018 Personal history of other benign neoplasm: Secondary | ICD-10-CM | POA: Insufficient documentation

## 2014-10-16 DIAGNOSIS — Z862 Personal history of diseases of the blood and blood-forming organs and certain disorders involving the immune mechanism: Secondary | ICD-10-CM | POA: Diagnosis not present

## 2014-10-16 DIAGNOSIS — Y9389 Activity, other specified: Secondary | ICD-10-CM | POA: Diagnosis not present

## 2014-10-16 DIAGNOSIS — Z8669 Personal history of other diseases of the nervous system and sense organs: Secondary | ICD-10-CM | POA: Insufficient documentation

## 2014-10-16 DIAGNOSIS — E785 Hyperlipidemia, unspecified: Secondary | ICD-10-CM | POA: Insufficient documentation

## 2014-10-16 DIAGNOSIS — T148XXA Other injury of unspecified body region, initial encounter: Secondary | ICD-10-CM

## 2014-10-16 DIAGNOSIS — S8991XA Unspecified injury of right lower leg, initial encounter: Secondary | ICD-10-CM | POA: Insufficient documentation

## 2014-10-16 DIAGNOSIS — S79911A Unspecified injury of right hip, initial encounter: Secondary | ICD-10-CM | POA: Diagnosis not present

## 2014-10-16 DIAGNOSIS — S4991XA Unspecified injury of right shoulder and upper arm, initial encounter: Secondary | ICD-10-CM | POA: Diagnosis not present

## 2014-10-16 MED ORDER — METHOCARBAMOL 500 MG PO TABS: 500.0000 mg | ORAL_TABLET | Freq: Two times a day (BID) | ORAL | Status: DC | PRN

## 2014-10-16 MED ORDER — CYCLOBENZAPRINE HCL 10 MG PO TABS
5.0000 mg | ORAL_TABLET | Freq: Once | ORAL | Status: AC
  Administered 2014-10-16: 5 mg via ORAL
  Filled 2014-10-16: qty 1
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
## 2014-10-16 NOTE — Discharge Instructions (Signed)
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
## 2014-11-02 ENCOUNTER — Telehealth: Payer: Self-pay | Admitting: Hematology

## 2014-11-02 NOTE — Telephone Encounter (Signed)
Moved 10/6 f/u from [facility] to [facility]. Left message for patient and mailed schedule.

## 2014-11-08 ENCOUNTER — Other Ambulatory Visit: Payer: Self-pay | Admitting: Internal Medicine

## 2014-11-26 ENCOUNTER — Encounter: Payer: Self-pay | Admitting: Neurology

## 2014-11-26 ENCOUNTER — Telehealth: Payer: Self-pay | Admitting: Hematology

## 2014-11-26 ENCOUNTER — Ambulatory Visit (HOSPITAL_BASED_OUTPATIENT_CLINIC_OR_DEPARTMENT_OTHER): Payer: Commercial Managed Care - HMO | Admitting: Hematology

## 2014-11-26 ENCOUNTER — Ambulatory Visit (INDEPENDENT_AMBULATORY_CARE_PROVIDER_SITE_OTHER): Payer: Commercial Managed Care - HMO | Admitting: Neurology

## 2014-11-26 ENCOUNTER — Encounter: Payer: Self-pay | Admitting: Hematology

## 2014-11-26 ENCOUNTER — Other Ambulatory Visit (HOSPITAL_BASED_OUTPATIENT_CLINIC_OR_DEPARTMENT_OTHER): Payer: Commercial Managed Care - HMO

## 2014-11-26 VITALS — BP 138/88 | HR 64 | Resp 16 | Ht 64.0 in | Wt 174.0 lb

## 2014-11-26 VITALS — BP 177/68 | HR 66 | Temp 98.7°F | Resp 19 | Ht 64.0 in | Wt 174.1 lb

## 2014-11-26 DIAGNOSIS — G20 Parkinson's disease: Secondary | ICD-10-CM | POA: Diagnosis not present

## 2014-11-26 DIAGNOSIS — D472 Monoclonal gammopathy: Secondary | ICD-10-CM

## 2014-11-26 DIAGNOSIS — C9000 Multiple myeloma not having achieved remission: Secondary | ICD-10-CM

## 2014-11-26 DIAGNOSIS — D649 Anemia, unspecified: Secondary | ICD-10-CM

## 2014-11-26 DIAGNOSIS — D709 Neutropenia, unspecified: Secondary | ICD-10-CM | POA: Diagnosis not present

## 2014-11-26 DIAGNOSIS — G20C Parkinsonism, unspecified: Secondary | ICD-10-CM

## 2014-11-26 LAB — COMPREHENSIVE METABOLIC PANEL (CC13)
ALT: 16 U/L (ref 0–55)
AST: 18 U/L (ref 5–34)
Albumin: 3.4 g/dL — ABNORMAL LOW (ref 3.5–5.0)
Alkaline Phosphatase: 66 U/L (ref 40–150)
Anion Gap: 4 mEq/L (ref 3–11)
BUN: 14.4 mg/dL (ref 7.0–26.0)
CO2: 27 mEq/L (ref 22–29)
Calcium: 8.8 mg/dL (ref 8.4–10.4)
Chloride: 109 mEq/L (ref 98–109)
Creatinine: 0.9 mg/dL (ref 0.6–1.1)
EGFR: 74 mL/min/{1.73_m2} — ABNORMAL LOW (ref >90)
Glucose: 99 mg/dL (ref 70–140)
Potassium: 4.3 mEq/L (ref 3.5–5.1)
Sodium: 139 mEq/L (ref 136–145)
Total Bilirubin: 0.51 mg/dL (ref 0.20–1.20)
Total Protein: 8.3 g/dL (ref 6.4–8.3)

## 2014-11-26 LAB — CBC WITH DIFFERENTIAL/PLATELET
BASO%: 0 % (ref 0.0–2.0)
Basophils Absolute: 0 10*3/uL (ref 0.0–0.1)
EOS%: 4.9 % (ref 0.0–7.0)
Eosinophils Absolute: 0.1 10*3/uL (ref 0.0–0.5)
HCT: 33.8 % — ABNORMAL LOW (ref 34.8–46.6)
HGB: 11 g/dL — ABNORMAL LOW (ref 11.6–15.9)
LYMPH%: 48.4 % (ref 14.0–49.7)
MCH: 31.2 pg (ref 25.1–34.0)
MCHC: 32.5 g/dL (ref 31.5–36.0)
MCV: 95.8 fL (ref 79.5–101.0)
MONO#: 0.2 10*3/uL (ref 0.1–0.9)
MONO%: 10.8 % (ref 0.0–14.0)
NEUT#: 0.8 10*3/uL — ABNORMAL LOW (ref 1.5–6.5)
NEUT%: 35.9 % — ABNORMAL LOW (ref 38.4–76.8)
Platelets: 198 10*3/uL (ref 145–400)
RBC: 3.53 10*6/uL — ABNORMAL LOW (ref 3.70–5.45)
RDW: 14.4 % (ref 11.2–14.5)
WBC: 2.2 10*3/uL — ABNORMAL LOW (ref 3.9–10.3)
lymph#: 1.1 10*3/uL (ref 0.9–3.3)

## 2014-11-26 LAB — LACTATE DEHYDROGENASE (CC13): LDH: 264 U/L — ABNORMAL HIGH (ref 125–245)

## 2014-11-26 MED ORDER — CARBIDOPA-LEVODOPA 25-100 MG PO TABS: ORAL_TABLET | ORAL | Status: DC

## 2014-11-26 NOTE — Telephone Encounter (Signed)
Gave patient avs report and appointments for April 2017. [facility] will call patient re bone survey appointment - patient aware.
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
## 2014-11-26 NOTE — Patient Instructions (Signed)
1. Increase Sinemet 25/10mg : take 2 tablets before breakfast, 1 tablet before lunch 2. Proceed with visit to Pain Management 3. Let us know if you would like to do physical therapy for balance 4. Follow-up in 3 months, call for any changes

## 2014-11-27 ENCOUNTER — Encounter: Payer: Self-pay | Admitting: Neurology

## 2014-11-27 LAB — KAPPA/LAMBDA LIGHT CHAINS
Kappa free light chain: 1.09 mg/dL (ref 0.33–1.94)
Kappa:Lambda Ratio: 0.29 (ref 0.26–1.65)
Lambda Free Lght Chn: 3.76 mg/dL — ABNORMAL HIGH (ref 0.57–2.63)

## 2014-11-28 ENCOUNTER — Other Ambulatory Visit: Payer: Self-pay | Admitting: Neurology

## 2014-11-29 NOTE — Progress Notes (Signed)
Brianna Pittman  HEMATOLOGY ONCOLOGY PROGRESS NOTE  Date of service: 11/26/2014  Patient Care Team: Janith Lima, MD as PCP - General (Internal Medicine)  CC: One-year scheduled follow-up for plasma cell dyscrasia.  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 Patient has had mild anemia and neutropenia. No overt constitutional symptoms.  FISH studies: 11/10/2011 showing t(11;14), 13q- and gain of chromosome 11 consistent with abnormalities found in multiple myeloma  Current Treatment: Close monitoring  INTERVAL HISTORY:  Brianna Pittman is here for her scheduled 1 year follow-up for her smoldering multiple myeloma. She notes no acute new bone pains. Still has some mild anemia that hasn't significantly changed. Neutropenia persists. Denies any significant infections. No fevers or chills. No overt night sweats. Weight has been fairly stable. She has had issues with worsening parkinsonian tremors and is seen by neurology for this and has had her Sinemet dose increased. We discussed at length her current findings with regards to potential plasma cell dyscrasia. Her questions were answered in details.  REVIEW OF SYSTEMS:    10 Point review of systems of done and is negative except as noted above.  . Past Medical History  Diagnosis Date  . Hypertension   . Hyperlipidemia   . Depression with anxiety   . Chest pain   . Benign neoplasm of unspecified site   . Esophageal reflux   . Osteoarthrosis, unspecified whether generalized or localized, unspecified site   . Allergy   . Anemia   . Anxiety   . Cataract   . Ulcer   . Colon polyps   . Vitamin D deficiency   . Depression     h/o depression    . Past Surgical History  Procedure Laterality Date  . Cataracts      both eyes  . Breast reduction    . Tummy tuck    . Cesarean section      x 4  . Abdominal hysterectomy  1995    . Social History    Substance Use Topics  . Smoking status: Never Smoker   . Smokeless tobacco: Never Used     Comment: been around 2nd hand smoke  . Alcohol Use: No    ALLERGIES:  is allergic to citrus; ibuprofen; and other.  MEDICATIONS:  Current Outpatient Prescriptions  Medication Sig Dispense Refill  . aspirin 81 MG tablet Take 81 mg by mouth daily.    . carbidopa-levodopa (SINEMET IR) 25-100 MG tablet Take 2 tablets before breakfast, then 1 tablet before lunch 90 tablet 4  . esomeprazole (NEXIUM) 20 MG capsule Take 1 capsule (20 mg total) by mouth daily with breakfast. 90 capsule 3  . famotidine (PEPCID) 20 MG tablet Take 20 mg by mouth at bedtime as needed.     . methocarbamol (ROBAXIN) 500 MG tablet Take 1 tablet (500 mg total) by mouth 2 (two) times daily as needed for muscle spasms. 20 tablet 0  . nebivolol (BYSTOLIC) 5 MG tablet Take 1 tablet (5 mg total) by mouth daily. 90 tablet 3  . simvastatin (ZOCOR) 20 MG tablet Take 1 tablet (20 mg total) by mouth daily. 14 tablet 0  . traMADol (ULTRAM) 50 MG tablet Take by mouth every 6 (six) hours as needed.    Brianna Pittman ZETIA 10 MG tablet TAKE 1 TABLET BY MOUTH EVERY DAY 30 tablet 11   No current facility-administered medications for this visit.    PHYSICAL  EXAMINATION: ECOG PERFORMANCE STATUS: 2 - Symptomatic, <50% confined to bed  Filed Vitals:   11/26/14 1358  BP: 177/68  Pulse: 66  Temp: 98.7 F (37.1 C)  Resp: 19   Filed Weights   11/26/14 1358  Weight: 174 lb 1.6 oz (78.971 kg)   .Body mass index is 29.87 kg/(m^2).  GENERAL:alert, in no acute distress and comfortable, notable resting tremor SKIN: skin color, texture, turgor are normal, no rashes or significant lesions EYES: normal, conjunctiva are pink and non-injected, sclera clear OROPHARYNX:no exudate, no erythema and lips, buccal mucosa, and tongue normal  NECK: supple, no JVD, thyroid normal size, non-tender, without nodularity LYMPH:  no palpable lymphadenopathy in the cervical,  axillary or inguinal LUNGS: clear to auscultation with normal respiratory effort HEART: regular rate & rhythm,  no murmurs and no lower extremity edema ABDOMEN: abdomen soft, non-tender, normoactive bowel sounds  Musculoskeletal: no cyanosis of digits and no clubbing  PSYCH: alert & oriented x 3 with fluent speech NEURO: no focal motor/sensory deficits  LABORATORY DATA:   I have reviewed the data as listed  . CBC Latest Ref Rng 11/26/2014 04/09/2014 11/18/2013  WBC 3.9 - 10.3 10e3/uL 2.2(L) 2.6(L) 2.7(L)  Hemoglobin 11.6 - 15.9 g/dL 11.0(L) 11.9(L) 11.3(L)  Hematocrit 34.8 - 46.6 % 33.8(L) 34.8(L) 35.1  Platelets 145 - 400 10e3/uL 198 181.0 181    . CMP Latest Ref Rng 11/26/2014 04/09/2014 11/18/2013  Glucose 70 - 140 mg/dl 99 98 115  BUN 7.0 - 26.0 mg/dL 14.4 17 15.6  Creatinine 0.6 - 1.1 mg/dL 0.9 0.93 0.9  Sodium 136 - 145 mEq/L 139 138 139  Potassium 3.5 - 5.1 mEq/L 4.3 4.2 4.0  Chloride 96 - 112 mEq/L - 107 -  CO2 22 - 29 mEq/L 27 28 25   Calcium 8.4 - 10.4 mg/dL 8.8 8.8 8.7  Total Protein 6.4 - 8.3 g/dL 8.3 8.6(H) 8.0  Total Bilirubin 0.20 - 1.20 mg/dL 0.51 0.4 0.50  Alkaline Phos 40 - 150 U/L 66 77 69  AST 5 - 34 U/L 18 19 16   ALT 0 - 55 U/L 16 13 16    . Lab Results  Component Value Date   LDH 264* 11/26/2014   SPEP on 11/26/2014 pending             RADIOGRAPHIC STUDIES: I have personally reviewed the radiological images as listed and agreed with the findings in the report. No results found.  ASSESSMENT & PLAN:   74 year old lady with  #1  IgG lambda Smoldering Multiple myeloma. Her SPEP results from today are still pending. In 2013 had 30% plasma cells in the bone marrow with M protein levels of 2.2. Cytogenetics with 13q deletion, t (11;14) and chromosome 11+ On labs today she has no evidence of hypercalcemia are worsening renal function area continues to be somewhat anemic but does not need the anemia criteria for overt multiple myeloma. Persistent  somewhat worse neutropenia. No issues with frequent infections. ECOG performance status 2. Plan -We'll follow-up the SPEP with quantitative immunoglobulins and IFE results. -Patient will need to be followed up in 6 months with repeat CBC, CMP, myeloma labs as well as a whole body skeletal survey to rule out bone lesions. -Follow-up earlier if any other acute new concerns arise. -Repeat blood counts with primary care physician in 3 months.  If worsening anemia or constitutional symptoms or new bone pains she has been recommended to be seen immediately.  Plan of care was discussed in detail with the patient.  Total time spent 25 minutes counseling the patient face to face. The total time spent in the appointment was 30 minutes and more than 50% was on counseling and direct patient cares.    Sullivan Lone MD [place] [facility] Hematology/Oncology Physician [facility]  (Office):       [phone] (Work cell):  [phone] (Fax):           [phone]

## 2014-11-30 LAB — SPEP & IFE WITH QIG
Abnormal Protein Band1: 2.3 g/dL
Albumin ELP: 3.6 g/dL — ABNORMAL LOW (ref 3.8–4.8)
Alpha-1-Globulin: 0.3 g/dL (ref 0.2–0.3)
Alpha-2-Globulin: 0.7 g/dL (ref 0.5–0.9)
Beta 2: 0.2 g/dL (ref 0.2–0.5)
Beta Globulin: 0.3 g/dL — ABNORMAL LOW (ref 0.4–0.6)
Gamma Globulin: 2.8 g/dL — ABNORMAL HIGH (ref 0.8–1.7)
IgA: 37 mg/dL — ABNORMAL LOW (ref 69–380)
IgG (Immunoglobin G), Serum: 2940 mg/dL — ABNORMAL HIGH (ref 690–1700)
IgM, Serum: 23 mg/dL — ABNORMAL LOW (ref 52–322)
Total Protein, Serum Electrophoresis: 7.9 g/dL (ref 6.1–8.1)

## 2014-12-28 ENCOUNTER — Encounter: Payer: Self-pay | Admitting: Family

## 2014-12-28 ENCOUNTER — Ambulatory Visit (INDEPENDENT_AMBULATORY_CARE_PROVIDER_SITE_OTHER): Payer: Commercial Managed Care - HMO | Admitting: Family

## 2014-12-28 VITALS — BP 160/86 | HR 63 | Temp 97.6°F | Resp 18 | Ht 64.0 in | Wt 171.0 lb

## 2014-12-28 DIAGNOSIS — C9000 Multiple myeloma not having achieved remission: Secondary | ICD-10-CM

## 2014-12-28 DIAGNOSIS — D472 Monoclonal gammopathy: Secondary | ICD-10-CM

## 2014-12-28 NOTE — Patient Instructions (Signed)
Thank you for choosing [facility].  Summary/Instructions:  Please stop by radiology on the basement level of the building for your x-rays. Your results will be released to [place] (or called to you) after review, usually within 72 hours after test completion. If any treatments or changes are necessary, you will be notified at that same time.  If your symptoms worsen or fail to improve, please contact our office for further instruction, or in case of emergency go directly to the emergency room at the closest medical facility.

## 2014-12-28 NOTE — Progress Notes (Signed)
Pre visit review using our clinic review tool, if applicable. No additional management support is needed unless otherwise documented below in the visit note. 

## 2014-12-28 NOTE — Assessment & Plan Note (Signed)
Additional information is required to complete this office visit as the patient is a questionable source of information. She reports the provider in [place] is requesting starting an unknown medication and a bone scan. Explained to the patient that we will attempt to contact the provider in [place] while notifying her PCP regarding this interaction for recommendation of how to proceed with her further care. Continue current medications as prescribed and follow up with PCP.

## 2014-12-28 NOTE — Progress Notes (Signed)
Subjective:    Patient ID: Brianna Pittman, female    DOB: 11/29/1940, 74 y.o.   MRN: [id number]  Chief Complaint  Patient presents with  . Joint Pain    states that she needs a bone density scan and that she has had joint issues and pain for years but has gotten worse and she hasn't been feeling well, also the doctor in [place] state she needs medicine but she is giving [place] a number to call them    HPI:  Brianna Pittman is a 75 y.o. female who  has a past medical history of Hypertension; Hyperlipidemia; Depression with anxiety; Chest pain; Benign neoplasm of unspecified site; Esophageal reflux; Osteoarthrosis, unspecified whether generalized or localized, unspecified site; Allergy; Anemia; Anxiety; Cataract; Ulcer; Colon polyps; Vitamin D deficiency; and Depression. and presents today for an office visit.   Previously diagnosed with smoldering multiple myeloma and is currently being managed by Dr. Candise Che of Oncology. Was recently seen in [place] by Dr. Alena Bills (Internal Medicine) who confirmed a diagnosis of multiple myeloma. She is currently residing in [place] and family is requesting her to move to [place]. Dr. Park Breed is also requesting a bone scan to be completed in addition to starting a medication that the patient is unsure of the name. Reports that she is experiencing the associated symptom of not feeling well. Denies any fevers or chills. Associated symptom of pain located in her upper back has been going on for about 10-20 years with the modifying factor of Aleve which does help occasionally.   Continuity of Care Information:  Dr. Lorna Dibble (Internal Medicine)  [phone]  Mindi Junker (Daughter) [phone]  Allergies  Allergen Reactions  . Citrus Other (See Comments)    Upset stomach  . Ibuprofen     REACTION: itching  . Other Other (See Comments)    Coffee:severe acid reflux     Current Outpatient Prescriptions on File Prior to Visit  Medication Sig Dispense Refill  . aspirin 81  MG tablet Take 81 mg by mouth daily.    . carbidopa-levodopa (SINEMET IR) 25-100 MG tablet Take 2 tablets before breakfast, then 1 tablet before lunch 90 tablet 4  . esomeprazole (NEXIUM) 20 MG capsule Take 1 capsule (20 mg total) by mouth daily with breakfast. 90 capsule 3  . famotidine (PEPCID) 20 MG tablet Take 20 mg by mouth at bedtime as needed.     . methocarbamol (ROBAXIN) 500 MG tablet Take 1 tablet (500 mg total) by mouth 2 (two) times daily as needed for muscle spasms. 20 tablet 0  . nebivolol (BYSTOLIC) 5 MG tablet Take 1 tablet (5 mg total) by mouth daily. 90 tablet 3  . simvastatin (ZOCOR) 20 MG tablet Take 1 tablet (20 mg total) by mouth daily. 14 tablet 0  . traMADol (ULTRAM) 50 MG tablet Take by mouth every 6 (six) hours as needed.    Marland Kitchen ZETIA 10 MG tablet TAKE 1 TABLET BY MOUTH EVERY DAY 30 tablet 11   No current facility-administered medications on file prior to visit.     Past Surgical History  Procedure Laterality Date  . Cataracts      both eyes  . Breast reduction    . Tummy tuck    . Cesarean section      x 4  . Abdominal hysterectomy  1995      Review of Systems  Constitutional: Negative for fever and chills.  Musculoskeletal:       Positive for upper back pain.  Objective:    BP 160/86 mmHg  Pulse 63  Temp(Src) 97.6 F (36.4 C) (Oral)  Resp 18  Ht 5\' 4"  (1.626 m)  Wt 171 lb (77.565 kg)  BMI 29.34 kg/m2  SpO2 98%  LMP 07/24/1993 Nursing note and vital signs reviewed.  Physical Exam  Constitutional: She is oriented to person, place, and time. She appears well-developed and well-nourished. No distress.  Cardiovascular: Normal rate, regular rhythm, normal heart sounds and intact distal pulses.   Pulmonary/Chest: Effort normal and breath sounds normal.  Neurological: She is alert and oriented to person, place, and time.  Skin: Skin is warm and dry.  Psychiatric: She has a normal mood and affect. Her behavior is normal. Judgment and thought  content normal.       Assessment & Plan:   Problem List Items Addressed This Visit      Other   Smoldering multiple myeloma (HCC) - Primary    Additional information is required to complete this office visit as the patient is a questionable source of information. She reports the provider in [place] is requesting starting an unknown medication and a bone scan. Explained to the patient that we will attempt to contact the provider in [place] while notifying her PCP regarding this interaction for recommendation of how to proceed with her further care. Continue current medications as prescribed and follow up with PCP.

## 2015-01-01 ENCOUNTER — Other Ambulatory Visit: Payer: Self-pay | Admitting: Family Medicine

## 2015-01-01 DIAGNOSIS — G20 Parkinson's disease: Secondary | ICD-10-CM

## 2015-01-01 DIAGNOSIS — G20C Parkinsonism, unspecified: Secondary | ICD-10-CM

## 2015-01-01 MED ORDER — CARBIDOPA-LEVODOPA 25-100 MG PO TABS: ORAL_TABLET | ORAL | Status: AC

## 2015-01-01 NOTE — Telephone Encounter (Signed)
Faxed 90 day refill request.

## 2015-05-07 ENCOUNTER — Other Ambulatory Visit: Payer: Self-pay | Admitting: Internal Medicine

## 2015-05-13 ENCOUNTER — Other Ambulatory Visit: Payer: Self-pay

## 2015-05-13 MED ORDER — NEBIVOLOL HCL 5 MG PO TABS: 5.0000 mg | ORAL_TABLET | Freq: Every day | ORAL | Status: DC

## 2015-05-27 ENCOUNTER — Ambulatory Visit: Payer: Commercial Managed Care - HMO | Admitting: Hematology

## 2015-05-27 ENCOUNTER — Other Ambulatory Visit: Payer: Commercial Managed Care - HMO

## 2015-09-29 ENCOUNTER — Emergency Department (HOSPITAL_COMMUNITY)
Admission: EM | Admit: 2015-09-29 | Discharge: 2015-09-29 | Disposition: A | Discharge status: Home / Self Care | Payer: Medicare HMO | Attending: Emergency Medicine | Admitting: Emergency Medicine

## 2015-09-29 ENCOUNTER — Encounter (HOSPITAL_COMMUNITY): Payer: Self-pay | Admitting: *Deleted

## 2015-09-29 DIAGNOSIS — C9000 Multiple myeloma not having achieved remission: Secondary | ICD-10-CM

## 2015-09-29 DIAGNOSIS — I10 Essential (primary) hypertension: Secondary | ICD-10-CM | POA: Diagnosis not present

## 2015-09-29 DIAGNOSIS — Z79899 Other long term (current) drug therapy: Secondary | ICD-10-CM | POA: Insufficient documentation

## 2015-09-29 DIAGNOSIS — C9001 Multiple myeloma in remission: Secondary | ICD-10-CM | POA: Diagnosis not present

## 2015-09-29 DIAGNOSIS — R531 Weakness: Secondary | ICD-10-CM | POA: Diagnosis present

## 2015-09-29 MED ORDER — SODIUM CHLORIDE 0.9 % IV BOLUS (SEPSIS): 500.0000 mL | Freq: Once | INTRAVENOUS | Status: DC

## 2015-09-29 NOTE — ED Notes (Signed)
Patient is A & O.  Patient understood discharge instructions.

## 2015-09-29 NOTE — ED Provider Notes (Addendum)
[facility] DEPT Provider Note   CSN: [id number] Arrival date & time: 09/29/15  3785  First Provider Contact:  First MD Initiated Contact with Patient 09/29/15 [phone]        History   Chief Complaint Chief Complaint  Patient presents with  . Weakness  . Generalized Body Aches    HPI Brianna Pittman is a 75 y.o. female.  She came here today to see an Oncologist about pain in her legs, which she feels is from Multiple Myeloma.  She states that she has been seen by an oncologist in [place] and he plans on doing chemotherapy to treat her multiple myeloma. She states that she is waiting for a "bed to open up", before receiving this treatment. Today she was concerned about the pain, so came here so she could get seen by an oncologist to have her multiple myeloma treated. She does not want any other evaluation or intervention. She could not remember the name of her oncologist , who previously saw her. She denies any other recent illnesses. There are no other known modifying factors.  HPI  Past Medical History:  Diagnosis Date  . Allergy   . Anemia   . Anxiety   . Benign neoplasm of unspecified site   . Cataract   . Chest pain   . Colon polyps   . Depression    h/o depression  . Depression with anxiety   . Esophageal reflux   . Hyperlipidemia   . Hypertension   . Osteoarthrosis, unspecified whether generalized or localized, unspecified site   . Ulcer   . Vitamin D deficiency     Patient Active Problem List   Diagnosis Date Noted  . Smoldering multiple myeloma ([place]) 12/28/2014  . Spinal stenosis in cervical region 09/24/2014  . 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 Dysphagia, pharyngoesophageal phase 08/06/2014  . PAC (premature atrial contraction) 06/09/2014  . Tremor 05/21/2014  . Nonspecific abnormal electrocardiogram (ECG) (EKG) 04/09/2014  . Routine general medical examination at a health care facility 04/09/2014  . Other screening mammogram 09/17/2013  .  Constipation 06/11/2013  . OAB (overactive bladder) 06/11/2013  . GERD (gastroesophageal reflux disease) 03/21/2012  . 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 Health care maintenance 09/12/2011  . Vitamin D deficiency 08/10/2008  . Osteoarthritis 03/09/2006  . Hyperlipidemia with target LDL less than 130 02/06/2006  . Essential hypertension 02/06/2006    Past Surgical History:  Procedure Laterality Date  . ABDOMINAL HYSTERECTOMY  1995  . breast reduction    . cataracts     both eyes  . CESAREAN SECTION     x 4  . tummy tuck      OB History    No data available       Home Medications    Prior to Admission medications   Medication Sig Start Date End Date Taking? Authorizing Provider  acetaminophen (TYLENOL) 500 MG tablet Take 500 mg by mouth every 6 (six) hours as needed for mild pain.   Yes Historical Provider, MD  amLODipine (NORVASC) 10 MG tablet Take 10 mg by mouth daily.   Yes Historical Provider, MD  carbidopa-levodopa (SINEMET IR) 25-100 MG tablet Take 2 tablets before breakfast, then 1 tablet before lunch 01/01/15  Yes Cameron Sprang, MD  nebivolol (BYSTOLIC) 5 MG tablet Take 1 tablet (5 mg total) by mouth daily. 05/13/15  Yes Janith Lima, MD  simvastatin (ZOCOR) 20 MG tablet Take 1 tablet (20 mg total) by mouth  daily. 08/06/14  Yes Janith Lima, MD  esomeprazole (NEXIUM) 20 MG capsule TAKE 1 CAPSULE EVERY DAY WITH BREAKFAST Patient not taking: Reported on 09/29/2015 05/07/15   Janith Lima, MD  methocarbamol (ROBAXIN) 500 MG tablet Take 1 tablet (500 mg total) by mouth 2 (two) times daily as needed for muscle spasms. Patient not taking: Reported on 09/29/2015 10/16/14   Noemi Chapel, MD  ZETIA 10 MG tablet TAKE 1 TABLET BY MOUTH EVERY DAY Patient not taking: Reported on 09/29/2015 11/08/14   Janith Lima, MD    Family History Family History  Problem Relation Age of Onset  . Ovarian cancer Mother   . Diabetes Father   . Diabetes Brother      x 2  . Prostate cancer Brother   . Diabetes Maternal Grandmother   . Diabetes Other   . Diabetes Maternal Uncle   . Colon cancer Neg Hx   . Esophageal cancer Neg Hx   . Rectal cancer Neg Hx   . Stomach cancer Neg Hx   . Neurologic Disorder Neg Hx     Social History Social History  Substance Use Topics  . Smoking status: Never Smoker  . Smokeless tobacco: Never Used     Comment: been around 2nd hand smoke  . Alcohol use No     Allergies   Citrus; Ibuprofen; and Other   Review of Systems Review of Systems  All other systems reviewed and are negative.    Physical Exam Updated Vital Signs BP (!) 141/46 (BP Location: Right Arm)   Pulse 65   Temp 98.7 F (37.1 C) (Oral)   Resp 17   Ht '5\' 4"'  (1.626 m)   Wt 170 lb (77.1 kg)   LMP 07/24/1993   SpO2 99%   BMI 29.18 kg/m   Physical Exam  Constitutional: She is oriented to person, place, and time. She appears well-developed and well-nourished.  HENT:  Head: Normocephalic and atraumatic.  Eyes: Conjunctivae and EOM are normal. Pupils are equal, round, and reactive to light.  Neck: Normal range of motion and phonation normal. Neck supple.  Cardiovascular: Normal rate.   Pulmonary/Chest: Effort normal.  Musculoskeletal: Normal range of motion.  Neurological: She is alert and oriented to person, place, and time. She exhibits normal muscle tone.  Skin: Skin is warm and dry.  Psychiatric: She has a normal mood and affect. Her behavior is normal. Judgment and thought content normal.  Nursing note and vitals reviewed.    ED Treatments / Results  Labs (all labs ordered are listed, but only abnormal results are displayed) Labs Reviewed - No data to display  EKG  EKG Interpretation None       Radiology No results found.  Procedures Procedures (including critical care time)  Medications Ordered in ED Medications - No data to display   Initial Impression / Assessment and Plan / ED Course  I have  reviewed the triage vital signs and the nursing notes.  Pertinent labs & imaging results that were available during my care of the patient were reviewed by me and considered in my medical decision making (see chart for details).  Clinical Course    Medications - No data to display  Patient Vitals for the past 24 hrs:  BP Temp Temp src Pulse Resp SpO2 Height Weight  09/29/15 1001 - - - - - - '5\' 4"'  (1.626 m) 170 lb (77.1 kg)  09/29/15 0933 (!) 141/46 98.7 F (37.1 C) Oral 65 17 99 % - -  10:34 AM Reevaluation with update and discussion. After initial assessment and treatment, an updated evaluation reveals Findings discussed with patient, all questions answered. Finnlee Silvernail L    Final Clinical Impressions(s) / ED Diagnoses   Final diagnoses:  Multiple myeloma, remission status unspecified ([place])   Leg pain, likely secondary to multiple myeloma. Patient does not have any acute illnesses apparent on questioning, or visual physical examination. There is no indication for further evaluation or intervention at this time.  Nursing Notes Reviewed/ Care Coordinated Applicable Imaging Reviewed Interpretation of Laboratory Data incorporated into ED treatment  The patient appears reasonably screened and/or stabilized for discharge and I doubt any other medical condition or other [facility] requiring further screening, evaluation, or treatment in the ED at this time prior to discharge.  Plan: Home Medications- continue; Home Treatments- rest; return here if the recommended treatment, does not improve the symptoms; Recommended follow up- PCP prn, Oncology asap   New Prescriptions New Prescriptions   No medications on file         Daleen Bo, MD 09/29/15 1035

## 2015-09-29 NOTE — ED Triage Notes (Signed)
Patient states yesterday she began feeling weakness with generalized body aches and pains.  Patient was supposed to leave for [place] today for treatment for multiple myeloma.  Patient states she is being treated there versus here because "I came here for years and nobody told me I had cancer."  No records available regarding tx in [place].  Patient denies N/V/D, fever, chest pain and SOB.

## 2016-03-27 ENCOUNTER — Encounter: Payer: Self-pay | Admitting: Gastroenterology

## 2016-11-29 ENCOUNTER — Telehealth: Payer: Self-pay | Admitting: Hematology

## 2016-11-29 NOTE — Telephone Encounter (Signed)
Patient came in wanting an appt but was informed that she needed a referral.

## 2017-10-10 ENCOUNTER — Emergency Department (HOSPITAL_COMMUNITY)
Admission: EM | Admit: 2017-10-10 | Discharge: 2017-10-10 | Disposition: A | Discharge status: Home / Self Care | Payer: Medicare (Managed Care) | Attending: Emergency Medicine | Admitting: Emergency Medicine

## 2017-10-10 ENCOUNTER — Other Ambulatory Visit: Payer: Self-pay

## 2017-10-10 ENCOUNTER — Encounter (HOSPITAL_COMMUNITY): Payer: Self-pay | Admitting: Emergency Medicine

## 2017-10-10 DIAGNOSIS — R030 Elevated blood-pressure reading, without diagnosis of hypertension: Secondary | ICD-10-CM | POA: Diagnosis not present

## 2017-10-10 DIAGNOSIS — Z79899 Other long term (current) drug therapy: Secondary | ICD-10-CM | POA: Diagnosis not present

## 2017-10-10 DIAGNOSIS — Z452 Encounter for adjustment and management of vascular access device: Secondary | ICD-10-CM | POA: Diagnosis present

## 2017-10-10 DIAGNOSIS — G8929 Other chronic pain: Secondary | ICD-10-CM | POA: Diagnosis not present

## 2017-10-10 DIAGNOSIS — I10 Essential (primary) hypertension: Secondary | ICD-10-CM | POA: Insufficient documentation

## 2017-10-10 DIAGNOSIS — M545 Low back pain: Secondary | ICD-10-CM | POA: Diagnosis not present

## 2017-10-10 NOTE — ED Provider Notes (Signed)
[facility] DEPT Provider Note   CSN: [id number] Arrival date & time: 10/10/17  1604     History   Chief Complaint Chief Complaint  Patient presents with  . port a cath pain    HPI Brianna Pittman is a 77 y.o. female.  HPI Patient with history of multiple myeloma.  States she is treated for this in [place].  She has a right-sided Port-A-Cath in place which she states is been there for more than 2 years.  She has episodic pain to the Port-A-Cath region.  She denies any pain currently.  She has chronic low back pain which radiates to her right hip.  No recent falls.  No focal weakness or numbness.  Denies fever or chills.  She is asking to have disability paperwork signed. Past Medical History:  Diagnosis Date  . Allergy   . Anemia   . Anxiety   . Benign neoplasm of unspecified site   . Cataract   . Chest pain   . Colon polyps   . Depression    h/o depression  . Depression with anxiety   . Esophageal reflux   . Hyperlipidemia   . Hypertension   . Osteoarthrosis, unspecified whether generalized or localized, unspecified site   . Ulcer   . Vitamin D deficiency     Patient Active Problem List   Diagnosis Date Noted  . Smoldering multiple myeloma ([place]) 12/28/2014  . Spinal stenosis in cervical region 09/24/2014  . 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 Dysphagia, pharyngoesophageal phase 08/06/2014  . PAC (premature atrial contraction) 06/09/2014  . Tremor 05/21/2014  . Nonspecific abnormal electrocardiogram (ECG) (EKG) 04/09/2014  . Routine general medical examination at a health care facility 04/09/2014  . Other screening mammogram 09/17/2013  . Constipation 06/11/2013  . OAB (overactive bladder) 06/11/2013  . GERD (gastroesophageal reflux disease) 03/21/2012  . 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 Health care maintenance 09/12/2011  . Vitamin D deficiency 08/10/2008  . Osteoarthritis 03/09/2006  .  Hyperlipidemia with target LDL less than 130 02/06/2006  . Essential hypertension 02/06/2006    Past Surgical History:  Procedure Laterality Date  . ABDOMINAL HYSTERECTOMY  1995  . breast reduction    . cataracts     both eyes  . CESAREAN SECTION     x 4  . tummy tuck       OB History   None      Home Medications    Prior to Admission medications   Medication Sig Start Date End Date Taking? Authorizing Provider  amLODipine (NORVASC) 10 MG tablet Take 10 mg by mouth daily.   Yes [provider]  carbidopa-levodopa (SINEMET IR) 25-100 MG tablet Take 2 tablets before breakfast, then 1 tablet before lunch 01/01/15  Yes Cameron Sprang, MD  Ferrous Sulfate Dried (SLOW IRON PO) Take 1 tablet by mouth daily.   Yes [provider]  naproxen sodium (ALEVE) 220 MG tablet Take 440 mg by mouth daily as needed (pain).   Yes [provider]  simvastatin (ZOCOR) 20 MG tablet Take 1 tablet (20 mg total) by mouth daily. 08/06/14  Yes Janith Lima, MD  esomeprazole (NEXIUM) 20 MG capsule TAKE 1 CAPSULE EVERY DAY WITH BREAKFAST Patient not taking: Reported on 09/29/2015 05/07/15   Janith Lima, MD  methocarbamol (ROBAXIN) 500 MG tablet Take 1 tablet (500 mg total) by mouth 2 (two) times daily as needed for muscle spasms. Patient not taking: Reported  on 09/29/2015 10/16/14   Noemi Chapel, MD  nebivolol (BYSTOLIC) 5 MG tablet Take 1 tablet (5 mg total) by mouth daily. Patient not taking: Reported on 10/10/2017 05/13/15   Janith Lima, MD  ZETIA 10 MG tablet TAKE 1 TABLET BY MOUTH EVERY DAY Patient not taking: Reported on 10/10/2017 11/08/14   Janith Lima, MD    Family History Family History  Problem Relation Age of Onset  . Ovarian cancer Mother   . Diabetes Father   . Diabetes Brother        x 2  . Prostate cancer Brother   . Diabetes Maternal Grandmother   . Diabetes Other   . Diabetes Maternal Uncle   . Colon cancer Neg Hx   . Esophageal cancer Neg Hx     . Rectal cancer Neg Hx   . Stomach cancer Neg Hx   . Neurologic Disorder Neg Hx     Social History Social History   Tobacco Use  . Smoking status: Never Smoker  . Smokeless tobacco: Never Used  . Tobacco comment: been around 2nd hand smoke  Substance Use Topics  . Alcohol use: No  . Drug use: No     Allergies   Citrus; Ibuprofen; and Other   Review of Systems Review of Systems  Constitutional: Negative for chills and fever.  HENT: Negative for sore throat and trouble swallowing.   Eyes: Negative for visual disturbance.  Respiratory: Negative for shortness of breath.   Cardiovascular: Negative for chest pain.  Gastrointestinal: Negative for constipation, diarrhea, nausea and vomiting.  Musculoskeletal: Positive for back pain and myalgias. Negative for neck pain.  Skin: Negative for rash and wound.  Neurological: Negative for dizziness, weakness, light-headedness, numbness and headaches.  All other systems reviewed and are negative.    Physical Exam Updated Vital Signs BP (!) 209/88 (BP Location: Left Arm)   Pulse 92   Temp 98.5 F (36.9 C) (Oral)   Resp 20   LMP 07/24/1993   SpO2 99%   Physical Exam  Constitutional: She is oriented to person, place, and time. She appears well-developed and well-nourished. No distress.  HENT:  Head: Normocephalic and atraumatic.  Mouth/Throat: Oropharynx is clear and moist. No oropharyngeal exudate.  Eyes: Pupils are equal, round, and reactive to light. EOM are normal.  Neck: Normal range of motion. Neck supple. No JVD present.  No meningismus  Cardiovascular: Normal rate and regular rhythm. Exam reveals no gallop and no friction rub.  No murmur heard. Pulmonary/Chest: Effort normal and breath sounds normal. No stridor. No respiratory distress. She has no wheezes. She has no rales. She exhibits no tenderness.  Port-A-Cath in place in the right upper chest.  No tenderness to palpation.  No erythema or warmth.  Abdominal:  Soft. Bowel sounds are normal. There is no tenderness. There is no rebound and no guarding.  Musculoskeletal: Normal range of motion. She exhibits no edema or tenderness.  No lower extremity swelling or asymmetry.  Distal pulses are 2+.  Mild diffuse lower lumbar midline tenderness to palpation.  No step-offs or deformity.  Pelvis is stable.  Lymphadenopathy:    She has no cervical adenopathy.  Neurological: She is alert and oriented to person, place, and time.  5/5 motor in all extremities.  Sensation fully intact.  Ambulating without assistance.  Skin: Skin is warm and dry. Capillary refill takes less than 2 seconds. No rash noted. She is not diaphoretic. No erythema.  Psychiatric: She has a normal mood and  affect. Her behavior is normal.  Nursing note and vitals reviewed.    ED Treatments / Results  Labs (all labs ordered are listed, but only abnormal results are displayed) Labs Reviewed - No data to display  EKG None  Radiology No results found.  Procedures Procedures (including critical care time)  Medications Ordered in ED Medications - No data to display   Initial Impression / Assessment and Plan / ED Course  I have reviewed the triage vital signs and the nursing notes.  Pertinent labs & imaging results that were available during my care of the patient were reviewed by me and considered in my medical decision making (see chart for details).     Patient refusing any imaging studies.  Complaints appear to be chronic in nature.  Normal neurologic exam.  She is advised to follow-up closely with her oncologist and primary physician.   Final Clinical Impressions(s) / ED Diagnoses   Final diagnoses:  Encounter for care related to Port-a-Cath  Chronic midline low back pain without sciatica  Elevated blood pressure reading    ED Discharge Orders    None       Julianne Rice, MD 10/10/17 1758

## 2017-10-10 NOTE — ED Notes (Signed)
PT MADE AWARE OF CURRENT VITAL SIGNS. ELEVATED BP. PT AWARE EDP WAS FINE WITH HER BEING DISCHARGED AND FOR HER TO TAKE HOME MEDICATIONS AND FOLLOW UP WITH HER PCP AS SOON AS POSSIBLE. PT VERBALIZED UNDERSTANDING. NO RX GIVEN

## 2017-10-10 NOTE — ED Notes (Signed)
MD at bedside. 

## 2017-10-10 NOTE — Discharge Instructions (Addendum)
Make an appointment to follow-up closely with your oncologist and with your primary physician.

## 2017-10-10 NOTE — ED Triage Notes (Signed)
Pt reports for over a week she has been having pain at her port a cath. reports had placed 3 years ago in [place].

## 2017-10-14 ENCOUNTER — Encounter (HOSPITAL_COMMUNITY): Payer: Self-pay

## 2017-10-14 ENCOUNTER — Emergency Department (HOSPITAL_COMMUNITY)
Admission: EM | Admit: 2017-10-14 | Discharge: 2017-10-14 | Disposition: A | Discharge status: Home / Self Care | Payer: Medicare (Managed Care) | Attending: Emergency Medicine | Admitting: Emergency Medicine

## 2017-10-14 ENCOUNTER — Other Ambulatory Visit: Payer: Self-pay

## 2017-10-14 DIAGNOSIS — Z79899 Other long term (current) drug therapy: Secondary | ICD-10-CM | POA: Diagnosis not present

## 2017-10-14 DIAGNOSIS — R55 Syncope and collapse: Secondary | ICD-10-CM

## 2017-10-14 DIAGNOSIS — I10 Essential (primary) hypertension: Secondary | ICD-10-CM | POA: Insufficient documentation

## 2017-10-14 DIAGNOSIS — G20 Parkinson's disease: Secondary | ICD-10-CM | POA: Diagnosis not present

## 2017-10-14 DIAGNOSIS — Z8579 Personal history of other malignant neoplasms of lymphoid, hematopoietic and related tissues: Secondary | ICD-10-CM | POA: Diagnosis not present

## 2017-10-14 DIAGNOSIS — R3981 Functional urinary incontinence: Secondary | ICD-10-CM | POA: Diagnosis not present

## 2017-10-14 DIAGNOSIS — D369 Benign neoplasm, unspecified site: Secondary | ICD-10-CM | POA: Insufficient documentation

## 2017-10-14 LAB — CBC
HCT: 34.9 % — ABNORMAL LOW (ref 36.0–46.0)
Hemoglobin: 11.6 g/dL — ABNORMAL LOW (ref 12.0–15.0)
MCH: 31.9 pg (ref 26.0–34.0)
MCHC: 33.2 g/dL (ref 30.0–36.0)
MCV: 95.9 fL (ref 78.0–100.0)
Platelets: 171 10*3/uL (ref 150–400)
RBC: 3.64 MIL/uL — ABNORMAL LOW (ref 3.87–5.11)
RDW: 14.9 % (ref 11.5–15.5)
WBC: 3.5 10*3/uL — ABNORMAL LOW (ref 4.0–10.5)

## 2017-10-14 LAB — URINALYSIS, ROUTINE W REFLEX MICROSCOPIC
Bilirubin Urine: NEGATIVE
Glucose, UA: NEGATIVE mg/dL
Hgb urine dipstick: NEGATIVE
Ketones, ur: 5 mg/dL — AB
Leukocytes, UA: NEGATIVE
Nitrite: NEGATIVE
Protein, ur: NEGATIVE mg/dL
Specific Gravity, Urine: 1.017 (ref 1.005–1.030)
pH: 6 (ref 5.0–8.0)

## 2017-10-14 LAB — BASIC METABOLIC PANEL
Anion gap: 7 (ref 5–15)
BUN: 22 mg/dL (ref 8–23)
CO2: 26 mmol/L (ref 22–32)
Calcium: 8.8 mg/dL — ABNORMAL LOW (ref 8.9–10.3)
Chloride: 109 mmol/L (ref 98–111)
Creatinine, Ser: 1.11 mg/dL — ABNORMAL HIGH (ref 0.44–1.00)
GFR calc Af Amer: 54 mL/min — ABNORMAL LOW (ref >60)
GFR calc non Af Amer: 47 mL/min — ABNORMAL LOW (ref >60)
Glucose, Bld: 102 mg/dL — ABNORMAL HIGH (ref 70–99)
Potassium: 4.3 mmol/L (ref 3.5–5.1)
Sodium: 142 mmol/L (ref 135–145)

## 2017-10-14 LAB — CBG MONITORING, ED: Glucose-Capillary: 110 mg/dL — ABNORMAL HIGH (ref 70–99)

## 2017-10-14 NOTE — Discharge Instructions (Signed)
Your testing showed no specific abnormalities, please seek medical exam for any severe or worsening symptoms otherwise you may follow-up with your family doctor within the next week as needed

## 2017-10-14 NOTE — ED Provider Notes (Signed)
[facility] DEPT Provider Note   CSN: [id number] Arrival date & time: 10/14/17  0917     History   Chief Complaint Chief Complaint  Patient presents with  . Loss of Consciousness    HPI Braylea Brancato is a 77 y.o. female.  The history is provided by the patient, a relative and medical records.  Loss of Consciousness   This is a recurrent problem. The current episode started less than 1 hour ago. The problem occurs every several days. The problem has been resolved. She lost consciousness for a period of greater than 5 minutes. The problem is associated with inactivity. Associated symptoms include bladder incontinence. Pertinent negatives include abdominal pain, back pain, chest pain, dizziness, focal weakness, headaches, light-headedness, nausea, palpitations, seizures, slurred speech, vertigo, visual change and vomiting. She has tried nothing for the symptoms. Her past medical history is significant for HTN. Her past medical history does not include CVA or seizures. Past medical history comments: Parkinson's disease.    Patient is a 77 year old female, according to the patient and as an additional historian her daughter who accompanies her she has had a syncopal episode that occurred this morning while she was sitting in her chair drinking tea, the daughter reports that she has had multiple evaluations for syncope in the past, she has been admitted to the hospitals, she was admitted to [facility] during which time they actually saw the syncopal episode and did not correlate this with arrhythmia.  She has had a EEG monitoring as well as multiple other studies none of which have yielded any answers.  This morning's syncopal episode seemed to last longer than usual hence the daughter called the ambulance transport.  There is been nothing to eat or drink, the patient has since resumed back to her normal state of health and complains of no fevers  vomiting abdominal pain chest pain coughing shortness of breath swelling numbness or weakness out of the ordinary for her Parkinson's disease.  She is currently undergoing chemotherapy in [place] for multiple myeloma and is up-to-date on her treatments.  The patient has no complaints at this time, the daughter states that she is back to her normal self.  There has been no change in medications recently other than adding aspirin for some chronic arthritis pain as well as adding some over-the-counter heat wraps topical products.  Past Medical History:  Diagnosis Date  . Allergy   . Anemia   . Anxiety   . Benign neoplasm of unspecified site   . Cataract   . Chest pain   . Colon polyps   . Depression    h/o depression  . Depression with anxiety   . Esophageal reflux   . Hyperlipidemia   . Hypertension   . Osteoarthrosis, unspecified whether generalized or localized, unspecified site   . Ulcer   . Vitamin D deficiency     Patient Active Problem List   Diagnosis Date Noted  . 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 Spinal stenosis in cervical region 09/24/2014  . 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 Dysphagia, pharyngoesophageal phase 08/06/2014  . PAC (premature atrial contraction) 06/09/2014  . Tremor 05/21/2014  . Nonspecific abnormal electrocardiogram (ECG) (EKG) 04/09/2014  . Routine general medical examination at a health care facility 04/09/2014  . Other screening mammogram 09/17/2013  . Constipation 06/11/2013  . OAB (overactive bladder) 06/11/2013  . GERD (gastroesophageal reflux disease) 03/21/2012  . 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  Health care maintenance 09/12/2011  . Vitamin D deficiency 08/10/2008  . Osteoarthritis 03/09/2006  . Hyperlipidemia with target LDL less than 130 02/06/2006  . Essential hypertension 02/06/2006    Past Surgical History:  Procedure Laterality Date  . ABDOMINAL HYSTERECTOMY  1995  . breast  reduction    . cataracts     both eyes  . CESAREAN SECTION     x 4  . tummy tuck       OB History   None      Home Medications    Prior to Admission medications   Medication Sig Start Date End Date Taking? Authorizing Provider  amLODipine (NORVASC) 10 MG tablet Take 10 mg by mouth daily.    [provider]  carbidopa-levodopa (SINEMET IR) 25-100 MG tablet Take 2 tablets before breakfast, then 1 tablet before lunch 01/01/15   Cameron Sprang, MD  esomeprazole (NEXIUM) 20 MG capsule TAKE 1 CAPSULE EVERY DAY WITH BREAKFAST Patient not taking: Reported on 09/29/2015 05/07/15   Janith Lima, MD  Ferrous Sulfate Dried (SLOW IRON PO) Take 1 tablet by mouth daily.    [provider]  methocarbamol (ROBAXIN) 500 MG tablet Take 1 tablet (500 mg total) by mouth 2 (two) times daily as needed for muscle spasms. Patient not taking: Reported on 09/29/2015 10/16/14   Noemi Chapel, MD  naproxen sodium (ALEVE) 220 MG tablet Take 440 mg by mouth daily as needed (pain).    [provider]  nebivolol (BYSTOLIC) 5 MG tablet Take 1 tablet (5 mg total) by mouth daily. Patient not taking: Reported on 10/10/2017 05/13/15   Janith Lima, MD  simvastatin (ZOCOR) 20 MG tablet Take 1 tablet (20 mg total) by mouth daily. 08/06/14   Janith Lima, MD  ZETIA 10 MG tablet TAKE 1 TABLET BY MOUTH EVERY DAY Patient not taking: Reported on 10/10/2017 11/08/14   Janith Lima, MD    Family History Family History  Problem Relation Age of Onset  . Ovarian cancer Mother   . Diabetes Father   . Diabetes Brother        x 2  . Prostate cancer Brother   . Diabetes Maternal Grandmother   . Diabetes Other   . Diabetes Maternal Uncle   . Colon cancer Neg Hx   . Esophageal cancer Neg Hx   . Rectal cancer Neg Hx   . Stomach cancer Neg Hx   . Neurologic Disorder Neg Hx     Social History Social History   Tobacco Use  . Smoking status: Never Smoker  . Smokeless tobacco: Never Used  .  Tobacco comment: been around 2nd hand smoke  Substance Use Topics  . Alcohol use: No  . Drug use: No     Allergies   Citrus; Ibuprofen; and Other   Review of Systems Review of Systems  Cardiovascular: Positive for syncope. Negative for chest pain and palpitations.  Gastrointestinal: Negative for abdominal pain, nausea and vomiting.  Genitourinary: Positive for bladder incontinence.  Musculoskeletal: Negative for back pain.  Neurological: Negative for dizziness, vertigo, focal weakness, seizures, light-headedness and headaches.  All other systems reviewed and are negative.    Physical Exam Updated Vital Signs BP (!) 142/62 (BP Location: Left Arm)   Pulse 61   Temp 98.2 F (36.8 C) (Oral)   Resp 19   LMP 07/24/1993   SpO2 100%   Physical Exam  Constitutional: She appears well-developed and well-nourished. No distress.  HENT:  Head: Normocephalic  and atraumatic.  Mouth/Throat: Oropharynx is clear and moist. No oropharyngeal exudate.  Eyes: Pupils are equal, round, and reactive to light. Conjunctivae and EOM are normal. Right eye exhibits no discharge. Left eye exhibits no discharge. No scleral icterus.  Neck: Normal range of motion. Neck supple. No JVD present. No thyromegaly present.  Cardiovascular: Normal rate, regular rhythm, normal heart sounds and intact distal pulses. Exam reveals no gallop and no friction rub.  No murmur heard. Pulmonary/Chest: Effort normal and breath sounds normal. No respiratory distress. She has no wheezes. She has no rales.  Abdominal: Soft. Bowel sounds are normal. She exhibits no distension and no mass. There is no tenderness.  Musculoskeletal: Normal range of motion. She exhibits no edema or tenderness.  Lymphadenopathy:    She has no cervical adenopathy.  Neurological: She is alert. Coordination normal.  The patient is awake alert and able to follow commands, she has good grips bilaterally which are symmetrical, no facial droop, speech is  baseline, mental status is awake and answering questions appropriately.  Skin: Skin is warm and dry. No rash noted. No erythema.  Psychiatric: She has a normal mood and affect. Her behavior is normal.  Nursing note and vitals reviewed.    ED Treatments / Results  Labs (all labs ordered are listed, but only abnormal results are displayed) Labs Reviewed  BASIC METABOLIC PANEL - Abnormal; Notable for the following components:      Result Value   Glucose, Bld 102 (*)    Creatinine, Ser 1.11 (*)    Calcium 8.8 (*)    GFR calc non Af Amer 47 (*)    GFR calc Af Amer 54 (*)    All other components within normal limits  URINALYSIS, ROUTINE W REFLEX MICROSCOPIC - Abnormal; Notable for the following components:   Ketones, ur 5 (*)    All other components within normal limits  CBC - Abnormal; Notable for the following components:   WBC 3.5 (*)    RBC 3.64 (*)    Hemoglobin 11.6 (*)    HCT 34.9 (*)    All other components within normal limits  CBG MONITORING, ED - Abnormal; Notable for the following components:   Glucose-Capillary 110 (*)    All other components within normal limits  CBG MONITORING, ED    EKG EKG Interpretation  Date/Time:  Sunday October 14 2017 09:29:36 EDT Ventricular Rate:  60 PR Interval:    QRS Duration: 81 QT Interval:  416 QTC Calculation: 416 R Axis:   58 Text Interpretation:  Sinus rhythm Nonspecific T abnormalities, lateral leads Since last tracing Nonspecific T wave abnormality NOW PRESENT Rate slower Confirmed by Noemi Chapel [phone]) on 10/14/2017 9:53:38 AM   Radiology No results found.  Procedures Procedures (including critical care time)  Medications Ordered in ED Medications - No data to display   Initial Impression / Assessment and Plan / ED Course  I have reviewed the triage vital signs and the nursing notes.  Pertinent labs & imaging results that were available during my care of the patient were reviewed by me and considered in my  medical decision making (see chart for details).  Clinical Course as of Oct 15 1155  Nancy Fetter Oct 14, 2017  1156 Labs reviewed showing no acute abnormalities, the patient has tolerated oral food and fluids and appears stable for discharge   [BM]    Clinical Course User Index [BM] Noemi Chapel, MD    It is unclear exactly what is causing the  patient's syncope though she has had an exhaustive work-up and I do not know that any further testing for this will yield any beneficial results.  I will check an EKG as well as some labs and a urinalysis but otherwise the patient appears to be back to baseline.  Final Clinical Impressions(s) / ED Diagnoses   Final diagnoses:  Syncope, unspecified syncope type    ED Discharge Orders    None       Noemi Chapel, MD 10/14/17 1157

## 2017-10-14 NOTE — ED Triage Notes (Signed)
Her daughter phoned EMS as pt. Was seen by her to "pass out" whilst drinking tea sitting in her chair. She arrives here drowsy and in no distress. She is being treated for lymphoma in [place]; unsure of most recent treatment.

## 2017-10-14 NOTE — ED Notes (Signed)
Bed: YT11 Expected date:  Expected time:  Means of arrival:  Comments: 77 yo syncope w/ hx of the same

## 2017-10-24 ENCOUNTER — Encounter: Payer: Self-pay | Admitting: Internal Medicine

## 2017-10-24 ENCOUNTER — Ambulatory Visit (INDEPENDENT_AMBULATORY_CARE_PROVIDER_SITE_OTHER): Payer: Medicare (Managed Care) | Admitting: Internal Medicine

## 2017-10-24 VITALS — BP 134/86 | HR 67 | Temp 98.6°F | Ht 63.5 in | Wt 171.0 lb

## 2017-10-24 DIAGNOSIS — C9000 Multiple myeloma not having achieved remission: Secondary | ICD-10-CM | POA: Diagnosis not present

## 2017-10-24 DIAGNOSIS — M47816 Spondylosis without myelopathy or radiculopathy, lumbar region: Secondary | ICD-10-CM | POA: Diagnosis not present

## 2017-10-24 MED ORDER — GABAPENTIN 100 MG PO CAPS: 100.0000 mg | ORAL_CAPSULE | Freq: Three times a day (TID) | ORAL | 3 refills | Status: DC

## 2017-10-24 MED ORDER — TRAMADOL HCL 50 MG PO TABS: 50.0000 mg | ORAL_TABLET | Freq: Four times a day (QID) | ORAL | 0 refills | Status: DC | PRN

## 2017-10-24 MED ORDER — LIDOCAINE 5 % EX PTCH: 1.0000 | MEDICATED_PATCH | CUTANEOUS | 0 refills | Status: DC

## 2017-10-24 NOTE — Patient Instructions (Signed)
Please take all new medication as prescribed - the gabapentin (watch for sleepiness), the tramadol as needed, and the patch as needed  You will be contacted regarding the referral for: Oncology, but this may not be able to be done before going back to [place]  Please continue all other medications as before, and refills have been done if requested.  Please have the pharmacy call with any other refills you may need.  Please keep your appointments with your specialists as you may have planned

## 2017-10-24 NOTE — Assessment & Plan Note (Addendum)
Will try referral to oncology, but advised daughter this evalaution and tx may not be practical to be done prior to her soon return to [place]  Note:  Total time for pt hx, exam, review of record with pt in the room, determination of diagnoses and plan for further eval and tx is > 40 min, with over 50% spent in coordination and counseling of patient including the differential dx, tx, further evaluation and other management of MM, and LBP

## 2017-10-24 NOTE — Assessment & Plan Note (Signed)
With probable at least bilateral sciatica, for gabapentin 100 tid, lidoderm patch, and tramadol prn, to f/u with PCP next wk

## 2017-10-24 NOTE — Progress Notes (Signed)
Subjective:    Patient ID: Brianna Pittman, female    DOB: 1940/05/22, 77 y.o.   MRN: [id number]  HPI  Here visiting the daughter who is trying to assist with some legal matters and has some documents coming, but not sure when they will arrive; pt is now being tx for MM at [place], has port in place right chest with monthly chemo (tolerates well) and is actually due tomorrow). Though not definite she may be going back to [place] on sat or sun this weekend, but may be another wk depending on the timing of receipt of the legal papers..  Today, chief complaint Pt continues to have recurring LBP (and shoulders and knees), but no  bowel or bladder change, fever, wt loss,  Worsening numbness/weakness, gait change or falls. Has known severe lumbar DJD  Tylenol not really helping.  And now ibuprofen not helping as well.  Not pain med seeking, in fact has declined tx to date, but now too bad to put up with, and daughter brings her as she seemed too much pain this AM.  Pt states her LBP does radiate to both distal feet.  Has tried some otc paiin patch recently that did help some.  Wants to avoid schedule II narcotic is possible.  Has not tried tramadol in past. Pt could obtain f/u next wik with PCP or oncology in [place] if needed per daughter.  Last seen in ED with syncopal episode unclear etiology, UA negative at that date 8/25.  Denies urinary symptoms such as dysuria, frequency, urgency, flank pain, hematuria or n/v, fever, chills. Past Medical History:  Diagnosis Date  . Allergy   . Anemia   . Anxiety   . Benign neoplasm of unspecified site   . Cataract   . Chest pain   . Colon polyps   . Depression    h/o depression  . Depression with anxiety   . Esophageal reflux   . Hyperlipidemia   . Hypertension   . Osteoarthrosis, unspecified whether generalized or localized, unspecified site   . Ulcer   . Vitamin D deficiency    Past Surgical History:  Procedure Laterality Date  . ABDOMINAL HYSTERECTOMY  1995  .  breast reduction    . cataracts     both eyes  . CESAREAN SECTION     x 4  . tummy tuck      reports that she has never smoked. She has never used smokeless tobacco. She reports that she does not drink alcohol or use drugs. family history includes Diabetes in her brother, father, maternal grandmother, maternal uncle, and other; Ovarian cancer in her mother; Prostate cancer in her brother. Allergies  Allergen Reactions  . Citrus Other (See Comments)    Upset stomach  . Ibuprofen     REACTION: itching  . Other Other (See Comments)    Coffee:severe acid reflux   Current Outpatient Medications on File Prior to Visit  Medication Sig Dispense Refill  . amLODipine (NORVASC) 10 MG tablet Take 10 mg by mouth daily.    . carbidopa-levodopa (SINEMET IR) 25-100 MG tablet Take 2 tablets before breakfast, then 1 tablet before lunch 270 tablet 1  . esomeprazole (NEXIUM) 20 MG capsule TAKE 1 CAPSULE EVERY DAY WITH BREAKFAST 90 capsule 3  . Ferrous Sulfate Dried (SLOW IRON PO) Take 1 tablet by mouth daily.    . methocarbamol (ROBAXIN) 500 MG tablet Take 1 tablet (500 mg total) by mouth 2 (two) times daily as needed for  muscle spasms. 20 tablet 0  . naproxen sodium (ALEVE) 220 MG tablet Take 440 mg by mouth daily as needed (pain).    . nebivolol (BYSTOLIC) 5 MG tablet Take 1 tablet (5 mg total) by mouth daily. 90 tablet 2  . simvastatin (ZOCOR) 20 MG tablet Take 1 tablet (20 mg total) by mouth daily. 14 tablet 0  . ZETIA 10 MG tablet TAKE 1 TABLET BY MOUTH EVERY DAY 30 tablet 11   No current facility-administered medications on file prior to visit.    Review of Systems  Constitutional: Negative for other unusual diaphoresis or sweats HENT: Negative for ear discharge or swelling Eyes: Negative for other worsening visual disturbances Respiratory: Negative for stridor or other swelling  Gastrointestinal: Negative for worsening distension or other blood Genitourinary: Negative for retention or other  urinary change Musculoskeletal: Negative for other MSK pain or swelling Skin: Negative for color change or other new lesions Neurological: Negative for worsening tremors and other numbness  Psychiatric/Behavioral: Negative for worsening agitation or other fatigue All other system neg per pt    Objective:   Physical Exam BP 134/86   Pulse 67   Temp 98.6 F (37 C) (Oral)   Ht 5' 3.5" (1.613 m)   Wt 171 lb (77.6 kg)   LMP 07/24/1993   SpO2 96%   BMI 29.82 kg/m  VS noted,  Constitutional: Pt appears in NAD HENT: Head: NCAT.  Right Ear: External ear normal.  Left Ear: External ear normal.  Eyes: . Pupils are equal, round, and reactive to light. Conjunctivae and EOM are normal Nose: without d/c or deformity Neck: Neck supple. Gross normal ROM Cardiovascular: Normal rate and regular rhythm.   Pulmonary/Chest: Effort normal and breath sounds without rales or wheezing.  Abd: soft NT, + BS Spine diffuse midline tender low lumbar Neurological: Pt is alert. At baseline orientation, motor grossly intact, but small tremulousness noted, has some probable mild cognitive impairment Skin: Skin is warm. No rashes, other new lesions, no LE edema Psychiatric: Pt behavior is normal without agitation  No other exam findings  Lab Results  Component Value Date   WBC 3.5 (L) 10/14/2017   HGB 11.6 (L) 10/14/2017   HCT 34.9 (L) 10/14/2017   PLT 171 10/14/2017   GLUCOSE 102 (H) 10/14/2017   CHOL 199 04/09/2014   TRIG 85.0 04/09/2014   HDL 52.30 04/09/2014   LDLDIRECT 119.5 11/09/2009   LDLCALC 130 (H) 04/09/2014   ALT 16 11/26/2014   AST 18 11/26/2014   NA 142 10/14/2017   K 4.3 10/14/2017   CL 109 10/14/2017   CREATININE 1.11 (H) 10/14/2017   BUN 22 10/14/2017   CO2 26 10/14/2017   TSH 1.77 04/09/2014   HGBA1C 5.8 01/11/2010        Assessment & Plan:

## 2017-10-25 ENCOUNTER — Telehealth: Payer: Self-pay

## 2017-10-25 NOTE — Telephone Encounter (Signed)
Called pt home phone and spoke with pt's daughter, Rosann Auerbach. Request made for pt to receive tx during 2 week visit to [place] from [place]. Unable to provide care during this period as pt is currently receiving care at another facility. In the future, pt daughter verbalized that pt may be here over a longer period of time, and asked what would be required in order to receive treatment. Explained that we would need in-depth information including, medication administration and dosing, doctor's notes, lab work, and contact information to collaborate with the other physician. Pt daughter verbalized understanding and asked about insurance coverage. Explained that insurance coverage can change based on the facility and the insurance provider would be the best source of information at this time.

## 2017-10-31 ENCOUNTER — Telehealth: Payer: Self-pay | Admitting: Internal Medicine

## 2017-10-31 NOTE — Telephone Encounter (Signed)
Copied from [place] [id number]. Topic: General - Other >> Oct 31, 2017 11:55 AM Cecelia Byars, NT wrote: Reason for CRM: The pharmacy told the patient  she needs a pa in order for the  lidocaine (LIDODERM) 5 % to get filled , please send to  [facility] [address] [place] [phone] (Phone) [phone] (Fax)

## 2017-11-01 NOTE — Telephone Encounter (Signed)
Unfortunately I think not; but some patients try OTC biofreeze

## 2017-11-01 NOTE — Telephone Encounter (Signed)
This is not a covered medication for the indication that it was rx'ed for. Is there an alternative that can be sent in?

## 2017-11-02 NOTE — Telephone Encounter (Signed)
Pt dtr informed to try biofreeze. Pt dtr stated understanding.

## 2017-11-05 ENCOUNTER — Telehealth: Payer: Self-pay | Admitting: Internal Medicine

## 2017-11-05 NOTE — Telephone Encounter (Signed)
Patient is calling to follow up and see if any progress has been made from previous call

## 2017-11-05 NOTE — Telephone Encounter (Signed)
Pts daughter called stating that pt was still in pain and was advised by Dr. Jenny Reichmann at her last visit to call office if traMADol (ULTRAM) 50 MG tablet and gabapentin (NEURONTIN) 100 MG capsule were not working for pt. Pts daughter is requesting Hydrocodone or something similar for pt. Please advise.

## 2017-11-05 NOTE — Telephone Encounter (Signed)
Copied from [place] [id number]. Topic: Quick Communication - See Telephone Encounter >> Nov 05, 2017 10:29 AM Sheran Luz wrote: CRM for notification. See Telephone encounter for: 11/05/17.  Pts daughter called stating that pt was still in pain and was advised by Dr. Jenny Reichmann at her last visit to call office if traMADol (ULTRAM) 50 MG tablet and gabapentin (NEURONTIN) 100 MG capsule were not working for pt. Pts daughter is requesting Hydrocodone or something similar for pt. Please advise.  [facility] [place], [place] [phone] (Phone) [phone] (Fax)

## 2017-11-05 NOTE — Telephone Encounter (Signed)
Very sorry, but I do not normally treat with schedule II or higher narcotics for pain; please see Dr Jones/PCP if more chronic pain treatment is needed

## 2017-11-06 ENCOUNTER — Encounter: Payer: Self-pay | Admitting: Family

## 2017-11-06 ENCOUNTER — Telehealth: Payer: Self-pay | Admitting: Family

## 2017-11-06 ENCOUNTER — Ambulatory Visit (INDEPENDENT_AMBULATORY_CARE_PROVIDER_SITE_OTHER): Payer: Medicare (Managed Care) | Admitting: Family

## 2017-11-06 VITALS — BP 142/82 | HR 67 | Temp 97.5°F | Ht 63.5 in | Wt 168.0 lb

## 2017-11-06 DIAGNOSIS — M545 Low back pain: Secondary | ICD-10-CM

## 2017-11-06 DIAGNOSIS — G8929 Other chronic pain: Secondary | ICD-10-CM

## 2017-11-06 MED ORDER — HYDROCODONE-ACETAMINOPHEN 5-325 MG PO TABS: 1.0000 | ORAL_TABLET | Freq: Four times a day (QID) | ORAL | 0 refills | Status: DC | PRN

## 2017-11-06 NOTE — Telephone Encounter (Signed)
Pt dtr states that pt is still in pain and that the tramadol is not helping. Is there an alternative that will could help or would you rather pt come back in and see you?

## 2017-11-06 NOTE — Patient Instructions (Signed)
You need to see your PCP and oncologist in [place] about your continued back issues. They need to be managing this for you.

## 2017-11-06 NOTE — Telephone Encounter (Signed)
Daughter came back in after patients appointment with Brianna Pittman requesting Brianna Pittman to look at bumps patient had on her body.  After speaking with [name] I explained to the patient and daughter that when a patient presents with multiple issues, the provider will address the most important concern.  I have informed the daughter we would send a note back for [name] to reach back out.  But that we would need to schedule another visit to address this concern.

## 2017-11-06 NOTE — Progress Notes (Signed)
Brianna Pittman is a 77 y.o. female with the following history as recorded in EpicCare:  Patient Active Problem List   Diagnosis Date Noted  . Multiple myeloma ([place]) 10/24/2017  . 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 Spinal stenosis in cervical region 09/24/2014  . 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 Dysphagia, pharyngoesophageal phase 08/06/2014  . PAC (premature atrial contraction) 06/09/2014  . Tremor 05/21/2014  . Nonspecific abnormal electrocardiogram (ECG) (EKG) 04/09/2014  . Routine general medical examination at a health care facility 04/09/2014  . Other screening mammogram 09/17/2013  . Constipation 06/11/2013  . OAB (overactive bladder) 06/11/2013  . GERD (gastroesophageal reflux disease) 03/21/2012  . 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 Health care maintenance 09/12/2011  . Vitamin D deficiency 08/10/2008  . Osteoarthritis 03/09/2006  . Hyperlipidemia with target LDL less than 130 02/06/2006  . Essential hypertension 02/06/2006    Current Outpatient Medications  Medication Sig Dispense Refill  . amLODipine (NORVASC) 10 MG tablet Take 10 mg by mouth daily.    . carbidopa-levodopa (SINEMET IR) 25-100 MG tablet Take 2 tablets before breakfast, then 1 tablet before lunch 270 tablet 1  . esomeprazole (NEXIUM) 20 MG capsule TAKE 1 CAPSULE EVERY DAY WITH BREAKFAST 90 capsule 3  . Ferrous Sulfate Dried (SLOW IRON PO) Take 1 tablet by mouth daily.    . naproxen sodium (ALEVE) 220 MG tablet Take 440 mg by mouth daily as needed (pain).    . nebivolol (BYSTOLIC) 5 MG tablet Take 1 tablet (5 mg total) by mouth daily. 90 tablet 2  . simvastatin (ZOCOR) 20 MG tablet Take 1 tablet (20 mg total) by mouth daily. 14 tablet 0  . ZETIA 10 MG tablet TAKE 1 TABLET BY MOUTH EVERY DAY 30 tablet 11  . HYDROcodone-acetaminophen (NORCO) 5-325 MG tablet Take 1 tablet by mouth every 6 (six)  hours as needed for moderate pain. 10 tablet 0   No current facility-administered medications for this visit.     Allergies: Citrus; Ibuprofen; and Other  Past Medical History:  Diagnosis Date  . Allergy   . Anemia   . Anxiety   . Benign neoplasm of unspecified site   . Cataract   . Chest pain   . Colon polyps   . Depression    h/o depression  . Depression with anxiety   . Esophageal reflux   . Hyperlipidemia   . Hypertension   . Osteoarthrosis, unspecified whether generalized or localized, unspecified site   . Ulcer   . Vitamin D deficiency     Past Surgical History:  Procedure Laterality Date  . ABDOMINAL HYSTERECTOMY  1995  . breast reduction    . cataracts     both eyes  . CESAREAN SECTION     x 4  . tummy tuck      Family History  Problem Relation Age of Onset  . Ovarian cancer Mother   . Diabetes Father   . Diabetes Brother        x 2  . Prostate cancer Brother   . Diabetes Maternal Grandmother   . Diabetes Other   . Diabetes Maternal Uncle   . Colon cancer Neg Hx   . Esophageal cancer Neg Hx   . Rectal cancer Neg Hx   . Stomach cancer Neg Hx   . Neurologic Disorder Neg Hx     Social History   Tobacco Use  . Smoking status:  Never Smoker  . Smokeless tobacco: Never Used  . Tobacco comment: been around 2nd hand smoke  Substance Use Topics  . Alcohol use: No    Subjective:  Patient presents with her daughter today with concerns for continued low back pain; was a patient here until she moved to [place] in 2016; came to visit her daughter here in early August and was seen by a provider at this clinic earlier this month- given Rx for Tramadol and Gabapentin; no relief with either medication. has a history of Multiple Myeloma- lives in [place] and under care of oncologist in [place]; will be going to [place] tomorrow; notes that primary care provider did an X-ray/ MRI in late July and was told that she had severe arthritis; had been doing PT in [place] until she  came to visit her daughter in early August; asking for short term refill on Hydrocodone until they can get patient back to [place] and back to her established medical providers.     Objective:  Vitals:   11/06/17 1308  BP: (!) 142/82  Pulse: 67  Temp: (!) 97.5 F (36.4 C)  TempSrc: Oral  SpO2: 96%  Weight: 168 lb (76.2 kg)  Height: 5' 3.5" (1.613 m)    General: Well developed, well nourished, in no acute distress  Skin : Warm and dry.  Head: Normocephalic and atraumatic  Lungs: Respirations unlabored; clear to auscultation bilaterally without wheeze, rales, rhonchi  CVS exam: normal rate and regular rhythm.  Musculoskeletal: No deformities; no active joint inflammation  Extremities: No edema, cyanosis, clubbing  Vessels: Symmetric bilaterally  Neurologic: Alert and oriented; speech intact; face symmetrical; uses cane;     1. Chronic low back pain, unspecified back pain laterality, with sciatica presence unspecified     Plan:  [place] Database is checked- no prescriptions filled other than Tramadol given here in early September; agree to give #10 Norco as patient is traveling by car to [place]; she is encouraged to get in with her PCP and oncologist as quickly as possible for management/ evaluation of her back pain. Both daughter and patient agree.  No follow-ups on file.  No orders of the defined types were placed in this encounter.   Requested Prescriptions   Signed Prescriptions Disp Refills  . HYDROcodone-acetaminophen (NORCO) 5-325 MG tablet 10 tablet 0    Sig: Take 1 tablet by mouth every 6 (six) hours as needed for moderate pain.

## 2017-11-06 NOTE — Telephone Encounter (Signed)
I have spoken with patient.  She will call back on Friday to schedule appt

## 2017-11-06 NOTE — Telephone Encounter (Signed)
Please offer another appointment for them to come back in. I know she was leaving to go to [place] for tomorrow so they may want to make an appointment for when they come back or she may see someone in [place] about it?

## 2019-05-01 ENCOUNTER — Ambulatory Visit (INDEPENDENT_AMBULATORY_CARE_PROVIDER_SITE_OTHER): Payer: Medicare (Managed Care) | Admitting: Family

## 2019-05-01 ENCOUNTER — Encounter: Payer: Self-pay | Admitting: Family

## 2019-05-01 ENCOUNTER — Other Ambulatory Visit: Payer: Self-pay

## 2019-05-01 ENCOUNTER — Ambulatory Visit (INDEPENDENT_AMBULATORY_CARE_PROVIDER_SITE_OTHER): Payer: Medicare (Managed Care)

## 2019-05-01 VITALS — BP 130/68 | HR 68 | Temp 98.1°F | Ht 63.5 in | Wt 174.8 lb

## 2019-05-01 DIAGNOSIS — M545 Low back pain, unspecified: Secondary | ICD-10-CM

## 2019-05-01 DIAGNOSIS — C9000 Multiple myeloma not having achieved remission: Secondary | ICD-10-CM | POA: Diagnosis not present

## 2019-05-01 DIAGNOSIS — G8929 Other chronic pain: Secondary | ICD-10-CM

## 2019-05-01 DIAGNOSIS — G20 Parkinson's disease: Secondary | ICD-10-CM

## 2019-05-01 DIAGNOSIS — M25511 Pain in right shoulder: Secondary | ICD-10-CM | POA: Diagnosis not present

## 2019-05-01 DIAGNOSIS — M159 Polyosteoarthritis, unspecified: Secondary | ICD-10-CM

## 2019-05-01 DIAGNOSIS — G20C Parkinsonism, unspecified: Secondary | ICD-10-CM

## 2019-05-01 DIAGNOSIS — M8949 Other hypertrophic osteoarthropathy, multiple sites: Secondary | ICD-10-CM

## 2019-05-01 DIAGNOSIS — M150 Primary generalized (osteo)arthritis: Secondary | ICD-10-CM

## 2019-05-01 NOTE — Progress Notes (Signed)
Brianna Pittman is a 79 y.o. female with the following history as recorded in EpicCare:  Patient Active Problem List   Diagnosis Date Noted  . Multiple myeloma ([place]) 10/24/2017  . 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 Spinal stenosis in cervical region 09/24/2014  . 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 Dysphagia, pharyngoesophageal phase 08/06/2014  . PAC (premature atrial contraction) 06/09/2014  . Tremor 05/21/2014  . Nonspecific abnormal electrocardiogram (ECG) (EKG) 04/09/2014  . Routine general medical examination at a health care facility 04/09/2014  . Other screening mammogram 09/17/2013  . Constipation 06/11/2013  . OAB (overactive bladder) 06/11/2013  . GERD (gastroesophageal reflux disease) 03/21/2012  . 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 Health care maintenance 09/12/2011  . Vitamin D deficiency 08/10/2008  . Osteoarthritis 03/09/2006  . Hyperlipidemia with target LDL less than 130 02/06/2006  . Essential hypertension 02/06/2006    Current Outpatient Medications  Medication Sig Dispense Refill  . amLODipine (NORVASC) 10 MG tablet Take 10 mg by mouth daily.    . carbidopa-levodopa (SINEMET IR) 25-100 MG tablet Take 2 tablets before breakfast, then 1 tablet before lunch 270 tablet 1  . cholecalciferol (VITAMIN D3) 25 MCG (1000 UNIT) tablet Take 5,000 Units by mouth daily.    . Ferrous Sulfate Dried (SLOW IRON PO) Take 1 tablet by mouth daily.    . naproxen sodium (ALEVE) 220 MG tablet Take 440 mg by mouth daily as needed (pain).    . nebivolol (BYSTOLIC) 5 MG tablet Take 1 tablet (5 mg total) by mouth daily. 90 tablet 2  . simvastatin (ZOCOR) 20 MG tablet Take 1 tablet (20 mg total) by mouth daily. 14 tablet 0   No current facility-administered medications for this visit.    Allergies: Citrus, Ibuprofen, and Other  Past Medical History:  Diagnosis Date  .  Allergy   . Anemia   . Anxiety   . Benign neoplasm of unspecified site   . Cataract   . Chest pain   . Colon polyps   . Depression    h/o depression  . Depression with anxiety   . Esophageal reflux   . Hyperlipidemia   . Hypertension   . Osteoarthrosis, unspecified whether generalized or localized, unspecified site   . Ulcer   . Vitamin D deficiency     Past Surgical History:  Procedure Laterality Date  . ABDOMINAL HYSTERECTOMY  1995  . breast reduction    . cataracts     both eyes  . CESAREAN SECTION     x 4  . tummy tuck      Family History  Problem Relation Age of Onset  . Ovarian cancer Mother   . Diabetes Father   . Diabetes Brother        x 2  . Prostate cancer Brother   . Diabetes Maternal Grandmother   . Diabetes Other   . Diabetes Maternal Uncle   . Colon cancer Neg Hx   . Esophageal cancer Neg Hx   . Rectal cancer Neg Hx   . Stomach cancer Neg Hx   . Neurologic Disorder Neg Hx     Social History   Tobacco Use  . Smoking status: Never Smoker  . Smokeless tobacco: Never Used  . Tobacco comment: been around 2nd hand smoke  Substance Use Topics  . Alcohol use: No    Subjective:  Patient presents with her daughter; patient is a very  limited historian- daughter provides the history; has not been seen here since 2019; Has been living in [place]- was due for follow up there last month;  Notes she is having increased pain in her back/ shoulder- not new issues but pain does seem more noticeable recently; daughter is not sure if symptoms are related to her Multiple Myeloma, Parkinson's Disease or arthritis; cannot tolerate any type of pain medication- is using OTC Tylenol and Ibuprofen; is not currently under active care of any providers in [place]; In [place], patient has a GP ( sees every other month), oncologist ( sees every other month) and neurologist ( every 4 months);  Daughter is planning to take her mother to [place] soon for follow-up- wanted  to be sure it was safe to drive her mother;    Objective:  Vitals:   05/01/19 1024  BP: 130/68  Pulse: 68  Temp: 98.1 F (36.7 C)  TempSrc: Oral  SpO2: 99%  Weight: 174 lb 12.8 oz (79.3 kg)  Height: 5' 3.5" (1.613 m)    General: Well developed, well nourished, in no acute distress  Skin : Warm and dry.  Head: Normocephalic and atraumatic  Lungs: Respirations unlabored;  Musculoskeletal: No deformities; LROM in right shoulder due to pain;  Extremities: No edema, cyanosis, clubbing  Vessels: Symmetric bilaterally  Neurologic: Alert and oriented; speech intact; face symmetrical; walks with a cane;    Assessment:  1. Chronic right shoulder pain   2. Chronic bilateral low back pain without sciatica   3. Multiple myeloma not having achieved remission (HCC)   4. 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 Primary osteoarthritis involving multiple joints     Plan:  Daughter is requesting updated X-rays of shoulder and low back to ensure it is safe to travel with her mom to [place]; they do plan to get the patient back to [place] soon for follow-up/ continued care there. Patient and daughter defer any type of medication today;  If the patient does opt to move back to [facility] permanently, they will follow-up with her PCP here to discuss chronic care needs.  This visit occurred during the SARS-CoV-2 public health emergency.  Safety protocols were in place, including screening questions prior to the visit, additional usage of staff PPE, and extensive cleaning of exam room while observing appropriate contact time as indicated for disinfecting solutions.      No follow-ups on file.  Orders Placed This Encounter  Procedures  . DG Shoulder Right    Standing Status:   Future    Number of Occurrences:   1    Standing Expiration Date:   06/30/2020    Order Specific Question:   Reason for Exam (SYMPTOM  OR DIAGNOSIS REQUIRED)    Answer:   right shoulder pain    Order Specific Question:    Preferred imaging location?    Answer:   [facility]    Order Specific Question:   Radiology Contrast Protocol - do NOT remove file path    Answer:   \\[email or web address].pdf  . DG Lumbar Spine 2-3 Views    Standing Status:   Future    Number of Occurrences:   1    Standing Expiration Date:   06/30/2020    Order Specific Question:   Reason for Exam (SYMPTOM  OR DIAGNOSIS REQUIRED)    Answer:   low back pain    Order Specific Question:   Preferred imaging location?    Answer:  [facility]    Order Specific Question:   Radiology Contrast Protocol - do NOT remove file path    Answer:   \\[email or web address]    Requested Prescriptions    No prescriptions requested or ordered in this encounter

## 2019-06-04 ENCOUNTER — Telehealth: Payer: Self-pay

## 2019-06-04 NOTE — Telephone Encounter (Signed)
Okay per Ronnald Ramp for pt to switch.

## 2019-06-04 NOTE — Telephone Encounter (Signed)
Yes I am accepting new patient.

## 2019-06-04 NOTE — Telephone Encounter (Signed)
New message    The patient daughter Daisy Lazar calling wanted her mom to switching location/ PCP to the [place] location due to the convenience of the home location.   The daughter is open to any provider who will accept her mother.   The daughter is asking Dr. Ronnald Ramp to select a provider for her mother.

## 2019-06-05 NOTE — Telephone Encounter (Signed)
Brianna Pittman and informed that we recommend Wilfred Lacy, NP and that she is taking on new patients.   Informed that [place] has been sent the message and will reach out to her to schedule the transfer of care visit.

## 2019-06-10 NOTE — Telephone Encounter (Signed)
Please call to schedule TOC. TY

## 2019-06-19 ENCOUNTER — Ambulatory Visit: Payer: Medicare (Managed Care) | Admitting: Family Medicine

## 2020-08-10 ENCOUNTER — Ambulatory Visit: Payer: Medicare (Managed Care) | Admitting: Internal Medicine

## 2020-08-13 ENCOUNTER — Other Ambulatory Visit: Payer: Self-pay

## 2020-08-13 ENCOUNTER — Ambulatory Visit (INDEPENDENT_AMBULATORY_CARE_PROVIDER_SITE_OTHER): Payer: Medicare (Managed Care) | Admitting: Internal Medicine

## 2020-08-13 ENCOUNTER — Encounter: Payer: Self-pay | Admitting: Internal Medicine

## 2020-08-13 VITALS — BP 134/80 | HR 74 | Temp 98.1°F | Resp 18 | Ht 64.0 in | Wt 166.8 lb

## 2020-08-13 DIAGNOSIS — C9000 Multiple myeloma not having achieved remission: Secondary | ICD-10-CM | POA: Diagnosis not present

## 2020-08-13 DIAGNOSIS — M5441 Lumbago with sciatica, right side: Secondary | ICD-10-CM | POA: Diagnosis not present

## 2020-08-13 DIAGNOSIS — G20 Parkinson's disease: Secondary | ICD-10-CM | POA: Diagnosis not present

## 2020-08-13 DIAGNOSIS — G8929 Other chronic pain: Secondary | ICD-10-CM

## 2020-08-13 DIAGNOSIS — G20C Parkinsonism, unspecified: Secondary | ICD-10-CM

## 2020-08-13 DIAGNOSIS — M150 Primary generalized (osteo)arthritis: Secondary | ICD-10-CM

## 2020-08-13 DIAGNOSIS — M5442 Lumbago with sciatica, left side: Secondary | ICD-10-CM

## 2020-08-13 DIAGNOSIS — I10 Essential (primary) hypertension: Secondary | ICD-10-CM

## 2020-08-13 DIAGNOSIS — M8949 Other hypertrophic osteoarthropathy, multiple sites: Secondary | ICD-10-CM

## 2020-08-13 DIAGNOSIS — M159 Polyosteoarthritis, unspecified: Secondary | ICD-10-CM

## 2020-08-13 MED ORDER — TRAMADOL HCL 50 MG PO TABS: 50.0000 mg | ORAL_TABLET | Freq: Four times a day (QID) | ORAL | 0 refills | Status: DC | PRN

## 2020-08-13 MED ORDER — GABAPENTIN 100 MG PO CAPS: 100.0000 mg | ORAL_CAPSULE | Freq: Three times a day (TID) | ORAL | 5 refills | Status: DC

## 2020-08-13 NOTE — Progress Notes (Signed)
Patient ID: Brianna Pittman, female   DOB: [birth year], 80 y.o.   MRN: [id number]        Chief Complaint: follow up with daughter from [place], regarding chronic pain       HPI:  Brianna Pittman is a 80 y.o. female here as above  Pt continues to have recurring LBP without change in severity, bowel or bladder change, fever, wt loss, but also has intermittent right > left neuritic pain it seems to below the knees, but also has bilateral chronic knee pain with marked swelling to the right most of the time, walks with cane, no recent giveaways or falls.  Last MRI possibly last yr, but not clear   Daughter states also she believes pt PD symptoms recently worsening, and not sure about neurology or heme onc f/u for MM .  Pt denies chest pain, increased sob or doe, wheezing, orthopnea, PND, increased LE swelling, palpitations, dizziness or syncope.   Pt denies polydipsia, polyuria, or new focal neuro s/s.   Pt denies fever, wt loss, night sweats, loss of appetite, or other constitutional symptoms        Wt Readings from Last 3 Encounters:  08/13/20 166 lb 12.8 oz (75.7 kg)  05/01/19 174 lb 12.8 oz (79.3 kg)  11/06/17 168 lb (76.2 kg)   BP Readings from Last 3 Encounters:  08/13/20 134/80  05/01/19 130/68  11/06/17 (!) 142/82         Past Medical History:  Diagnosis Date   Allergy    Anemia    Anxiety    Benign neoplasm of unspecified site    Cataract    Chest pain    Colon polyps    Depression    h/o depression   Depression with anxiety    Esophageal reflux    Hyperlipidemia    Hypertension    Osteoarthrosis, unspecified whether generalized or localized, unspecified site    Ulcer    Vitamin D deficiency    Past Surgical History:  Procedure Laterality Date   ABDOMINAL HYSTERECTOMY  1995   breast reduction     cataracts     both eyes   CESAREAN SECTION     x 4   tummy tuck      reports that she has never smoked. She has never used smokeless tobacco. She reports that she does not  drink alcohol and does not use drugs. family history includes Diabetes in her brother, father, maternal grandmother, maternal uncle, and another family member; Ovarian cancer in her mother; Prostate cancer in her brother. Allergies  Allergen Reactions   Citrus Other (See Comments)    Upset stomach   Ibuprofen     REACTION: itching   Other Other (See Comments)    Coffee:severe acid reflux   Current Outpatient Medications on File Prior to Visit  Medication Sig Dispense Refill   amLODipine (NORVASC) 10 MG tablet Take 10 mg by mouth daily.     carbidopa-levodopa (SINEMET IR) 25-100 MG tablet Take 2 tablets before breakfast, then 1 tablet before lunch 270 tablet 1   cholecalciferol (VITAMIN D3) 25 MCG (1000 UNIT) tablet Take 5,000 Units by mouth daily.     Ferrous Sulfate Dried (SLOW IRON PO) Take 1 tablet by mouth daily.     naproxen sodium (ALEVE) 220 MG tablet Take 440 mg by mouth daily as needed (pain).     nebivolol (BYSTOLIC) 5 MG tablet Take 1 tablet (5 mg total) by mouth daily. 90 tablet 2  simvastatin (ZOCOR) 20 MG tablet Take 1 tablet (20 mg total) by mouth daily. 14 tablet 0   No current facility-administered medications on file prior to visit.        ROS:  All others reviewed and negative.  Objective        PE:  BP 134/80   Pulse 74   Temp 98.1 F (36.7 C) (Rectal)   Resp 18   Ht 5' 4"  (1.626 m)   Wt 166 lb 12.8 oz (75.7 kg)   LMP 07/24/1993   SpO2 99%   BMI 28.63 kg/m                 Constitutional: Pt appears in NAD               HENT: Head: NCAT.                Right Ear: External ear normal.                 Left Ear: External ear normal.                Eyes: . Pupils are equal, round, and reactive to light. Conjunctivae and EOM are normal               Nose: without d/c or deformity               Neck: Neck supple. Gross normal ROM               Cardiovascular: Normal rate and regular rhythm.                 Pulmonary/Chest: Effort normal and breath sounds  without rales or wheezing.                Abd:  Soft, NT, ND, + BS, no organomegaly               Neurological: Pt is alert. At baseline orientation, motor grossly intact; right knee with 1-2+ effusion               Skin: Skin is warm. No rashes, no other new lesions, LE edema -none               Psychiatric: Pt behavior is normal without agitation   Micro: none  Cardiac tracings I have personally interpreted today:  none  Pertinent Radiological findings (summarize): none   Lab Results  Component Value Date   WBC 3.5 (L) 10/14/2017   HGB 11.6 (L) 10/14/2017   HCT 34.9 (L) 10/14/2017   PLT 171 10/14/2017   GLUCOSE 102 (H) 10/14/2017   CHOL 199 04/09/2014   TRIG 85.0 04/09/2014   HDL 52.30 04/09/2014   LDLDIRECT 119.5 11/09/2009   LDLCALC 130 (H) 04/09/2014   ALT 16 11/26/2014   AST 18 11/26/2014   NA 142 10/14/2017   K 4.3 10/14/2017   CL 109 10/14/2017   CREATININE 1.11 (H) 10/14/2017   BUN 22 10/14/2017   CO2 26 10/14/2017   TSH 1.77 04/09/2014   HGBA1C 5.8 01/11/2010   Assessment/Plan:  Brianna Pittman is a 81 y.o. Black or African American [2] female with  has a past medical history of Allergy, Anemia, Anxiety, Benign neoplasm of unspecified site, Cataract, Chest pain, Colon polyps, Depression, Depression with anxiety, Esophageal reflux, Hyperlipidemia, Hypertension, Osteoarthrosis, unspecified whether generalized or localized, unspecified site, Ulcer, and Vitamin D deficiency.  Multiple myeloma (HCC) Also for referral heme/onc f/u  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  [place]) Also for referral neurology - Dr Tat  Low back pain Suspect significant undelrying lumbar ddd or djd, possilby spinal stenosis, for tramadol prn, gabapentin 100 tid, and refer sports medicine  Osteoarthritis Worst now to bilateral knees, with right knee effusion and difficulty with ambulating, also for tramadol prn, and refer sports medicine  Essential hypertension BP Readings from Last 3 Encounters:   08/13/20 134/80  05/01/19 130/68  11/06/17 (!) 142/82   Stable, pt to continue medical treatment norvasc, bystolic  Followup: Return if symptoms worsen or fail to improve.  Cathlean Cower, MD 08/14/2020 2:49 PM [facility]

## 2020-08-13 NOTE — Patient Instructions (Addendum)
Please take all new medication as prescribed - the tramadol for pain as needed, and the gabapentin as directed  You will be contacted regarding the referral for: Neurology - Dr Tat, and Oncology  Please consider seeing Sports Medicine on the first floor for further evaluation for the knees and lower back  Please continue all other medications as before, and refills have been done if requested.  Please have the pharmacy call with any other refills you may need.  Please keep your appointments with your specialists as you may have planned

## 2020-08-14 ENCOUNTER — Encounter: Payer: Self-pay | Admitting: Internal Medicine

## 2020-08-14 DIAGNOSIS — M545 Low back pain, unspecified: Secondary | ICD-10-CM | POA: Insufficient documentation

## 2020-08-14 NOTE — Assessment & Plan Note (Signed)
BP Readings from Last 3 Encounters:  08/13/20 134/80  05/01/19 130/68  11/06/17 (!) 142/82   Stable, pt to continue medical treatment norvasc, bystolic

## 2020-08-14 NOTE — Assessment & Plan Note (Addendum)
Suspect significant undelrying lumbar ddd or djd, possilby spinal stenosis, for tramadol prn, gabapentin 100 tid, and refer sports medicine

## 2020-08-14 NOTE — Assessment & Plan Note (Signed)
Also for referral neurology - Dr Tat

## 2020-08-14 NOTE — Assessment & Plan Note (Addendum)
Also for referral heme/onc f/u

## 2020-08-14 NOTE — Assessment & Plan Note (Signed)
Worst now to bilateral knees, with right knee effusion and difficulty with ambulating, also for tramadol prn, and refer sports medicine

## 2020-08-16 NOTE — Progress Notes (Signed)
Subjective:    I'm seeing this patient as a consultation for: Dr. Cathlean Cower. Note will be routed back to referring provider/PCP.  CC: Low back pain & bilateral knee pain  I, Molly Weber, LAT, ATC, am serving as scribe for Dr. Lynne Leader.  HPI: Pt is a 80 y/o female c/o chronic bilateral knee pain, R>L, worsening over the last couple months. Pt locates pain to her B ant/post knees.  She does report a fall in Feb. 2022.   Knee swelling: yes- R Mechanical symptoms: yes Aggravates: walking and weight-bearing activity Treatments tried: tramadol; Aleve; IcyHot  Additionally, pt c/o chronic LBP x few months. Pt locates pain to her entire lower back.   Radiating pain: yes to her ankles but R>L LE numbness/tingling: yes Aggravates: transitioning from supine to sit and sit-to-stand; prolonged sitting Treatments tried: tramadol, gabapentin; Aleve; heat  Dx imaging: 05/01/19 L-spine XR  11/23/08 R knee XR  06/03/08 R knee XR  05/15/00 L-spine MRI  Medical history significant for multiple myeloma and multisystem atrophy with Parkinson's disease.  She has difficulty with gait and ambulation and has had frequent falls.  Past medical history, Surgical history, Family history, Social history, Allergies, and medications have been entered into the medical record, reviewed.   Review of Systems: No new headache, visual changes, nausea, vomiting, diarrhea, constipation, dizziness, abdominal pain, skin rash, fevers, chills, night sweats, weight loss, swollen lymph nodes, body aches, joint swelling, muscle aches, chest pain, shortness of breath, mood changes, visual or auditory hallucinations.   Objective:    Vitals:   08/17/20 1333  BP: 132/70  Pulse: 68  SpO2: 95%   General: Well Developed, well nourished, and in no acute distress.  Neuro/Psych: Alert and oriented x3, extra-ocular muscles intact, able to move all 4 extremities, sensation grossly intact. Skin: Warm and dry, no rashes noted.   Respiratory: Not using accessory muscles, speaking in full sentences, trachea midline.  Cardiovascular: Pulses palpable, no extremity edema. Abdomen: Does not appear distended. MSK:  L-spine normal. Minimally diffusely tender midline lumbar spine.  No areas of specific point tenderness. Tender palpation paraspinal musculature. Decreased lumbar motion. Forward flexed position with standing and gait.  Right knee mild effusion normal motion with crepitation. Stable ligamentous exam. Tender palpation medial joint line. Decreased strength.  Left knee mild effusion normal motion with crepitation. Stable ligamentous exam. Tender palpation medial joint line. Decreased strength.    Lab and Radiology Results  Procedure: Real-time Ultrasound Guided Injection of right knee superior lateral patellar space Device: Philips Affiniti 50G Images permanently stored and available for review in PACS Verbal informed consent obtained.  Discussed risks and benefits of procedure. Warned about infection bleeding damage to structures skin hypopigmentation and fat atrophy among others. Patient expresses understanding and agreement Time-out conducted.   Noted no overlying erythema, induration, or other signs of local infection.   Skin prepped in a sterile fashion.   Local anesthesia: Topical Ethyl chloride.   With sterile technique and under real time ultrasound guidance: 40 mg of Kenalog and 2 mL of Marcaine injected into knee joint. Fluid seen entering the joint capsule.   Completed without difficulty   Pain immediately resolved suggesting accurate placement of the medication.   Advised to call if fevers/chills, erythema, induration, drainage, or persistent bleeding.   Images permanently stored and available for review in the ultrasound unit.  Impression: Technically successful ultrasound guided injection.    Procedure: Real-time Ultrasound Guided Injection of left knee superior lateral patellar  space  Device: Philips Affiniti 50G Images permanently stored and available for review in PACS Verbal informed consent obtained.  Discussed risks and benefits of procedure. Warned about infection bleeding damage to structures skin hypopigmentation and fat atrophy among others. Patient expresses understanding and agreement Time-out conducted.   Noted no overlying erythema, induration, or other signs of local infection.   Skin prepped in a sterile fashion.   Local anesthesia: Topical Ethyl chloride.   With sterile technique and under real time ultrasound guidance: 40 mg of Kenalog and 2 mL of Marcaine injected into joint capsule. Fluid seen entering the knee joint.   Completed without difficulty   Pain immediately resolved suggesting accurate placement of the medication.   Advised to call if fevers/chills, erythema, induration, drainage, or persistent bleeding.   Images permanently stored and available for review in the ultrasound unit.  Impression: Technically successful ultrasound guided injection.    X-ray images L-spine and bilateral knees obtained today personally and independently interpreted  L-spine: DDD L5-S1.  Diffuse facet DJD.  No acute fractures.  Right knee: Mild medial and lateral DJD.  Moderate patellofemoral DJD.  No acute fractures.  Left knee: Moderate medial DJD.  Moderate patellofemoral DJD.  No acute fractures.  Await formal radiology review  Impression and Recommendations:    Assessment and Plan: 80 y.o. female with low back pain.  Neck pain chronic and predominantly due to degenerative changes and probably due to more weakness.  Her multiple myeloma history is obviously concerning however I do not see any obvious compression fracture myeloma because of the spine however radiology overread is still pending. Plan for physical therapy primarily for fall prevention but also for normal strength.  Knee pain bilaterally due to DJD.  Plan for steroid injection today  both sides.  Recheck in 6 weeks.  Impaired gait secondary to neurologic multisystem atrophy and Parkinson's disease.  Plan for PT for gait training.  Recheck 6 weeks.   PDMP not reviewed this encounter. Orders Placed This Encounter  Procedures   [place] LIMITED JOINT SPACE STRUCTURES LOW BILAT(NO LINKED CHARGES)    Order Specific Question:   Reason for Exam (SYMPTOM  OR DIAGNOSIS REQUIRED)    Answer:   B knee pain    Order Specific Question:   Preferred imaging location?    Answer:   [facility]   DG Knee AP/LAT W/Sunrise Right    Standing Status:   Future    Number of Occurrences:   1    Standing Expiration Date:   08/17/2021    Order Specific Question:   Reason for Exam (SYMPTOM  OR DIAGNOSIS REQUIRED)    Answer:   Bl knee pain    Order Specific Question:   Preferred imaging location?    Answer:   [facility]   DG Knee AP/LAT W/Sunrise Left    Standing Status:   Future    Number of Occurrences:   1    Standing Expiration Date:   08/17/2021    Order Specific Question:   Reason for Exam (SYMPTOM  OR DIAGNOSIS REQUIRED)    Answer:   Bl knee pain    Order Specific Question:   Preferred imaging location?    Answer:   [facility]   DG Lumbar Spine 2-3 Views    Standing Status:   Future    Number of Occurrences:   1    Standing Expiration Date:   08/17/2021    Order Specific Question:   Reason for  Exam (SYMPTOM  OR DIAGNOSIS REQUIRED)    Answer:   low back pain    Order Specific Question:   Preferred imaging location?    Answer:   [facility]   Ambulatory referral to Physical Therapy    Referral Priority:   Routine    Referral Type:   Physical Medicine    Referral Reason:   Specialty Services Required    Requested Specialty:   Physical Therapy    Number of Visits Requested:   1   No orders of the defined types were placed in this encounter.   Discussed warning signs or symptoms. Please see discharge instructions. Patient  expresses understanding.   The above documentation has been reviewed and is accurate and complete Lynne Leader, M.D.

## 2020-08-17 ENCOUNTER — Ambulatory Visit (INDEPENDENT_AMBULATORY_CARE_PROVIDER_SITE_OTHER): Payer: Medicare (Managed Care)

## 2020-08-17 ENCOUNTER — Ambulatory Visit (INDEPENDENT_AMBULATORY_CARE_PROVIDER_SITE_OTHER): Payer: Medicare (Managed Care) | Admitting: Family Medicine

## 2020-08-17 ENCOUNTER — Other Ambulatory Visit: Payer: Self-pay

## 2020-08-17 ENCOUNTER — Encounter: Payer: Self-pay | Admitting: Family Medicine

## 2020-08-17 ENCOUNTER — Ambulatory Visit: Payer: Self-pay

## 2020-08-17 VITALS — BP 132/70 | HR 68 | Ht 64.0 in | Wt 167.6 lb

## 2020-08-17 DIAGNOSIS — M8949 Other hypertrophic osteoarthropathy, multiple sites: Secondary | ICD-10-CM | POA: Diagnosis not present

## 2020-08-17 DIAGNOSIS — G8929 Other chronic pain: Secondary | ICD-10-CM

## 2020-08-17 DIAGNOSIS — M545 Low back pain, unspecified: Secondary | ICD-10-CM

## 2020-08-17 DIAGNOSIS — M25561 Pain in right knee: Secondary | ICD-10-CM | POA: Diagnosis not present

## 2020-08-17 DIAGNOSIS — M25562 Pain in left knee: Secondary | ICD-10-CM | POA: Diagnosis not present

## 2020-08-17 DIAGNOSIS — G20 Parkinson's disease: Secondary | ICD-10-CM | POA: Diagnosis not present

## 2020-08-17 DIAGNOSIS — G20C Parkinsonism, unspecified: Secondary | ICD-10-CM

## 2020-08-17 DIAGNOSIS — M159 Polyosteoarthritis, unspecified: Secondary | ICD-10-CM

## 2020-08-17 DIAGNOSIS — C9000 Multiple myeloma not having achieved remission: Secondary | ICD-10-CM

## 2020-08-17 DIAGNOSIS — Z7409 Other reduced mobility: Secondary | ICD-10-CM

## 2020-08-17 DIAGNOSIS — M150 Primary generalized (osteo)arthritis: Secondary | ICD-10-CM

## 2020-08-17 DIAGNOSIS — D472 Monoclonal gammopathy: Secondary | ICD-10-CM

## 2020-08-17 NOTE — Patient Instructions (Addendum)
Nice to meet you.  You had B knee injections today.  Call or go to the ER if you develop a large red swollen joint with extreme pain or oozing puss.   Please get an Xray today before you leave   Recheck in about 1 month  I've referred you to Physical Therapy.  Let us know if you don't hear from them in one week.

## 2020-08-18 NOTE — Progress Notes (Signed)
X-ray lumbar spine shows arthritis changes.  They appear to be stable from prior x-rays.

## 2020-08-18 NOTE — Progress Notes (Signed)
X-ray left knee shows medium arthritis.

## 2020-08-18 NOTE — Progress Notes (Signed)
X-ray right knee shows severe arthritis

## 2020-08-31 ENCOUNTER — Ambulatory Visit: Payer: Medicare (Managed Care) | Attending: Family Medicine | Admitting: Physical Therapy

## 2020-08-31 ENCOUNTER — Other Ambulatory Visit: Payer: Self-pay

## 2020-08-31 ENCOUNTER — Encounter: Payer: Self-pay | Admitting: Physical Therapy

## 2020-08-31 DIAGNOSIS — R29818 Other symptoms and signs involving the nervous system: Secondary | ICD-10-CM | POA: Insufficient documentation

## 2020-08-31 DIAGNOSIS — R2681 Unsteadiness on feet: Secondary | ICD-10-CM | POA: Insufficient documentation

## 2020-08-31 DIAGNOSIS — R2689 Other abnormalities of gait and mobility: Secondary | ICD-10-CM | POA: Diagnosis not present

## 2020-08-31 DIAGNOSIS — M6281 Muscle weakness (generalized): Secondary | ICD-10-CM | POA: Insufficient documentation

## 2020-08-31 NOTE — Patient Instructions (Signed)
Access Code: [email or web address] URL: [email or web address] Date: 08/31/2020 Prepared by: Mady Haagensen  Exercises Seated March - 1-2 x daily - 7 x weekly - 1-2 sets - 10 reps Seated Long Arc Quad - 1-2 x daily - 7 x weekly - 1-2 sets - 10 reps Seated Ankle Dorsiflexion AROM - 1-2 x daily - 7 x weekly - 1-2 sets - 10 reps

## 2020-09-01 NOTE — Therapy (Signed)
[facility] [address] [place], [place], [address] Phone: [phone]   Fax:  [phone]  Physical Therapy Evaluation  Patient Details  Name: Brianna Pittman MRN: [id number] Date of Birth: [birth year] Referring Provider (PT): Dr. Lynne Leader   Encounter Date: 08/31/2020   PT End of Session - 09/01/20 1722     Visit Number 1    Number of Visits 16    Date for PT Re-Evaluation 10/22/20    Authorization Type [employer]    Progress Note Due on Visit 10    PT Start Time 1320    PT Stop Time 1403    PT Time Calculation (min) 43 min    Equipment Utilized During Treatment Gait belt    Activity Tolerance Patient tolerated treatment well    Behavior During Therapy WFL for tasks assessed/performed             Past Medical History:  Diagnosis Date   Allergy    Anemia    Anxiety    Benign neoplasm of unspecified site    Cataract    Chest pain    Colon polyps    Depression    h/o depression   Depression with anxiety    Esophageal reflux    Hyperlipidemia    Hypertension    Osteoarthrosis, unspecified whether generalized or localized, unspecified site    Ulcer    Vitamin D deficiency     Past Surgical History:  Procedure Laterality Date   ABDOMINAL HYSTERECTOMY  1995   breast reduction     cataracts     both eyes   CESAREAN SECTION     x 4   tummy tuck      There were no vitals filed for this visit.    Subjective Assessment - 08/31/20 1329     Subjective Pt/daughter reports having injections in knees, but still having some pain.  Would like help with walking, getting up from chairs, getting into the bed.  Has a cane and walker; typically uses the cane.  No falls in the past 6 months.    Patient is accompained by: Family member   daughter   Pertinent History PMH:  high cholesterol, HTN, mutiple myeloma, MSA dx 2017, orthostatic hypotension, OA    Limitations Walking;Standing    How  long can you walk comfortably? few minutes    Patient Stated Goals Pt's goals for therapy are to get stronger for walking, transfers, bed mobiltiy    Currently in Pain? Yes    Pain Location Knee    Pain Orientation Right;Left    Pain Descriptors / Indicators Aching    Pain Type Chronic pain    Pain Onset More than a month ago    Pain Frequency Intermittent    Aggravating Factors  getting out of bed    Pain Relieving Factors medication    Effect of Pain on Daily Activities PT will attempt to address with exercises and modalities/education as needed    Multiple Pain Sites Yes    Pain Location Back    Pain Orientation Lower    Pain Descriptors / Indicators Aching    Pain Type Chronic pain    Pain Frequency Intermittent    Aggravating Factors  sitting, getting up from sitting    Pain Relieving Factors massage                [facility] PT Assessment - 09/01/20 1606       Assessment  Medical Diagnosis Parkinsonian tremor, imparied functional mobility    Referring Provider (PT) Dr. Lynne Leader    Onset Date/Surgical Date 08/17/20    Prior Therapy Prior to Covid      Precautions   Precautions Fall      Balance Screen   Has the patient fallen in the past 6 months No    Has the patient had a decrease in activity level because of a fear of falling?  Yes    Is the patient reluctant to leave their home because of a fear of falling?  Yes      [place] Private residence    Living Arrangements Children    Available Help at Discharge Family    Type of [place] to enter    Entrance Stairs-Number of Steps 1    [place] Two level    Alternate Level Stairs-Number of Steps [place] - 2 wheels;Cane - quad      Prior Function   Level of Independence Requires assistive device for independence;Needs assistance with ADLs    Leisure Enjoys making her tea at home, shopping       Observation/Other Assessments   Focus on Therapeutic Outcomes (FOTO)  NA      AROM   Overall AROM  Deficits    Overall AROM Comments decreased hip flexion, decreased L ankle dorsiflexion in sittin      Strength   Overall Strength Deficits    Strength Assessment Site Hip;Knee;Ankle    Right/Left Hip Right;Left    Right Hip Flexion 3-/5    Left Hip Flexion 3-/5    Right/Left Knee Right;Left    Right Knee Flexion 3+/5   pain with resistance   Right Knee Extension 4/5    Left Knee Flexion 3+/5   pain with resistance   Left Knee Extension 4/5    Right/Left Ankle Right;Left    Right Ankle Dorsiflexion 3+/5    Left Ankle Dorsiflexion 3+/5      Transfers   Transfers Sit to Stand;Stand to Sit    Sit to Stand 3: Mod assist    Stand to Sit 4: Min assist    Comments Takes pt 18.03 sec to perform 1 sit to stand      Ambulation/Gait   Ambulation/Gait Yes    Ambulation/Gait Assistance 4: Min guard;4: Min assist    Ambulation/Gait Assistance Details Pt uses cane, but with narrow placement, narrow BOS.  She stops several times for standing break during gait.    Ambulation Distance (Feet) 80 Feet   x 2   Assistive device Small based quad cane    Gait Pattern Step-through pattern;Decreased step length - right;Decreased step length - left;Decreased stride length;Decreased dorsiflexion - right;Decreased dorsiflexion - left;Shuffle;Narrow base of support;Poor foot clearance - left;Poor foot clearance - right    Ambulation Surface Level;Indoor      Standardized Balance Assessment   Standardized Balance Assessment Timed Up and Go Test      Timed Up and Go Test   Normal TUG (seconds) 133.28    TUG Comments cane and min guard                        Objective measurements completed on examination: See above findings.       Access Code: [email or web address] URL: [email or web address] Date: 08/31/2020 Prepared by: Mady Haagensen  Exercises-pt performed 1 set x 10 reps  of each-cues for technique (added to HEP)  Discussed importance of exercises in return to activity and decrease in pain. Seated March - 1-2 x daily - 7 x weekly - 1-2 sets - 10 reps Seated Long Arc Quad - 1-2 x daily - 7 x weekly - 1-2 sets - 10 reps Seated Ankle Dorsiflexion AROM - 1-2 x daily - 7 x weekly - 1-2 sets - 10 reps        PT Education - 09/01/20 1721     Education Details Eval results, POC, HEP initiated, cues for more effective sit to stand trasnfer    Person(s) Educated Patient;Child(ren)   daughter, Rosann Auerbach   Methods Explanation;Demonstration;Verbal cues;Handout    Comprehension Verbalized understanding;Returned demonstration              PT Short Term Goals - 09/01/20 1731       PT SHORT TERM GOAL #1   Title Pt will perform HEP with family supervision for improved strength/flexibility, balance, decreased pain, transfers, and gait.  TARGET 09/24/2020    Time 4    Period Weeks    Status New      PT SHORT TERM GOAL #2   Title Pt will perform sit to stand transfer with minimal assistance in less than 12 seconds for improved efficiency and safety with transfers.    Baseline 1 sit to stand, mod assist, 18.03 sec    Time 4    Period Weeks    Status New      PT SHORT TERM GOAL #3   Title Pt will perform TUG in less than or equal to 120 seconds for imrpoved short distances with gait in the home.    Baseline 133.28 sec    Time 4    Period Weeks    Status New      PT SHORT TERM GOAL #4   Title Pt will ambulate at least 100 ft using appropriate device (cane versus walker) with min guard, for improved household gait.    Baseline 80 ft, quad cane, standing breaks    Time 4    Period Weeks    Status New      PT SHORT TERM GOAL #5   Title Pt/daughter will verbalize understanding of fall prevention in the home environment.    Time 4    Period Weeks    Status New               PT Long Term Goals - 09/01/20 1735       PT LONG TERM GOAL #1   Title Pt  will perform HEP with family supervision for improved flexibility/strength, balance, transfers, and gait.  TARGET 10/22/2020    Time 8    Period Weeks    Status New      PT LONG TERM GOAL #2   Title Pt will perform sit to stand x 1 rep, supervision, in less than 8 secodns to demo improved transfer efficiency and functional strength.    Time 8    Period Weeks    Status New      PT LONG TERM GOAL #3   Title Pt will improve TUG score to less than or equal to 90 seconds for decreased fall risk, improved short distance mobility.    Time 8    Period Weeks    Status New      PT LONG TERM GOAL #4   Title Pt/daughter will report at  least 25% improvement in bed mobility and car transfers.    Time 8    Period Weeks    Status New      PT LONG TERM GOAL #5   Title Pt will ambulate at least 250 ft using appropriate device (RW versus cane) with supervision for improved gait efficiency and safety in the home/community.    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  Pt presents to [facility] with decreased functional strength, overall slowed movement patterns, decreased gait speed (unable to accurately assess due to assistance given to patient and pt needing standing rest breaks), decreased independence with gait and transfers, decreased balance, pain in bilateral knees (following injection from MD which initially decreased pain per MD note).  Pt demonstrates increased fall risk with TUG score > 2 minutes; she takes >18 seconds with mod assist for standing.  Daughter reports difficulty with car transfers and bed mobility.  Pt would benefit from skilled PT to address the above stated deficits for decreased fall risk and improved overall functional mobility in the home.    Personal Factors and Comorbidities  Comorbidity 3+    Comorbidities PMH:  high cholesterol, HTN, mutiple myeloma, MSA dx 2017, orthostatic hypotension, OA    Examination-Activity Limitations Locomotion Level;Bed Mobility;Transfers;Stand    Examination-Participation Restrictions Community Activity;Shop    Stability/Clinical Decision Making Evolving/Moderate complexity    Clinical Decision Making Moderate    Rehab Potential Good    PT Frequency 2x / week    PT Duration 8 weeks   including eval week   PT Treatment/Interventions ADLs/Self Care Home Management;DME Instruction;Neuromuscular re-education;Balance training;Therapeutic exercise;Therapeutic activities;Functional mobility training;Gait training;Cryotherapy;Patient/family education;Manual techniques;Passive range of motion    PT Next Visit Plan Review initial HEP and add to HEP as appropriate; continue to work on transfers-transfer technique (scooting, foot placement, forward lean, push up to stand with hands); work on gait (possibly with walker to help with increased step length; use of NuStep/SciFit for flexibility and lower extremity strength.  Will need to work on bed mobility, car transfers at some point    Consulted and Agree with Plan of Care Patient;Family member/caregiver    Family Member Consulted daughter             Patient will benefit from skilled therapeutic intervention in order to improve the following deficits and impairments:  Abnormal gait, Difficulty walking, Decreased activity tolerance, Decreased balance, Impaired flexibility, Decreased mobility, Decreased strength, Postural dysfunction  Visit Diagnosis: Other abnormalities of gait and mobility  Muscle weakness (generalized)  Unsteadiness on feet  Other symptoms and signs involving the nervous system     Problem List Patient Active Problem List   Diagnosis Date Noted   Low back pain 08/14/2020   Multiple myeloma ([place]) 10/24/2017   Degenerative joint disease (DJD) of lumbar spine  10/24/2017   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   Spinal stenosis in cervical region 09/24/2014   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   Dysphagia, pharyngoesophageal phase 08/06/2014   PAC (premature atrial contraction) 06/09/2014   Tremor 05/21/2014   Nonspecific abnormal electrocardiogram (ECG) (EKG) 04/09/2014   Routine general medical examination at a health care facility 04/09/2014   Other screening  mammogram 09/17/2013   Constipation 06/11/2013   OAB (overactive bladder) 06/11/2013   GERD (gastroesophageal reflux disease) 03/21/2012   MGUS (monoclonal gammopathy of unknown significance) 11/24/2011   Health care maintenance 09/12/2011   Vitamin D deficiency 08/10/2008   Osteoarthritis 03/09/2006   Hyperlipidemia with target LDL less than 130 02/06/2006   Essential hypertension 02/06/2006    Zeda Gangwer W. 09/01/2020, 5:40 PM Frazier Butt., PT   [facility] [address] [place], [place], [address] Phone: [phone]   Fax:  [phone]  Name: Brianna Pittman MRN: [id number] Date of Birth: [birth year]

## 2020-09-03 ENCOUNTER — Ambulatory Visit: Payer: Medicare (Managed Care) | Admitting: Physical Therapy

## 2020-09-06 ENCOUNTER — Other Ambulatory Visit: Payer: Self-pay

## 2020-09-06 ENCOUNTER — Ambulatory Visit: Payer: Medicare (Managed Care) | Admitting: Physical Therapy

## 2020-09-06 DIAGNOSIS — M6281 Muscle weakness (generalized): Secondary | ICD-10-CM

## 2020-09-06 DIAGNOSIS — R2689 Other abnormalities of gait and mobility: Secondary | ICD-10-CM | POA: Diagnosis not present

## 2020-09-06 DIAGNOSIS — R2681 Unsteadiness on feet: Secondary | ICD-10-CM

## 2020-09-06 DIAGNOSIS — R29818 Other symptoms and signs involving the nervous system: Secondary | ICD-10-CM

## 2020-09-06 NOTE — Patient Instructions (Signed)
Access Code: [email or web address] URL: [email or web address] Date: 09/06/2020 Prepared by: Janann August  Exercises Seated March - 1-2 x daily - 7 x weekly - 1-2 sets - 10 reps Seated Long Arc Quad - 1-2 x daily - 7 x weekly - 1-2 sets - 10 reps Seated Ankle Dorsiflexion AROM - 1-2 x daily - 7 x weekly - 1-2 sets - 10 reps Seated Heel Raise - 1-2 x daily - 7 x weekly - 1-2 sets - 10 reps Seated Active Hip Flexion - 1-2 x daily - 7 x weekly - 1-2 sets - 10 reps

## 2020-09-06 NOTE — Therapy (Signed)
[facility] [address] [place], [place], [address] Phone: [phone]   Fax:  [phone]  Physical Therapy Treatment  Patient Details  Name: Brianna Pittman MRN: [id number] Date of Birth: 19-Nov-1940 Referring Provider (PT): Dr. Lynne Leader   Encounter Date: 09/06/2020   PT End of Session - 09/07/20 0911     Visit Number 2    Number of Visits 16    Date for PT Re-Evaluation 10/22/20    Authorization Type [employer]    Progress Note Due on Visit 10    PT Start Time 1403    PT Stop Time 1445    PT Time Calculation (min) 42 min    Equipment Utilized During Treatment Gait belt    Activity Tolerance Patient tolerated treatment well    Behavior During Therapy WFL for tasks assessed/performed             Past Medical History:  Diagnosis Date   Allergy    Anemia    Anxiety    Benign neoplasm of unspecified site    Cataract    Chest pain    Colon polyps    Depression    h/o depression   Depression with anxiety    Esophageal reflux    Hyperlipidemia    Hypertension    Osteoarthrosis, unspecified whether generalized or localized, unspecified site    Ulcer    Vitamin D deficiency     Past Surgical History:  Procedure Laterality Date   ABDOMINAL HYSTERECTOMY  1995   breast reduction     cataracts     both eyes   CESAREAN SECTION     x 4   tummy tuck      There were no vitals filed for this visit.   Subjective Assessment - 09/06/20 1409     Subjective No changes since she was last here. For longer distances will use a RW.    Patient is accompained by: Family member   daughter   Pertinent History PMH:  high cholesterol, HTN, mutiple myeloma, MSA dx 2017, orthostatic hypotension, OA    Limitations Walking;Standing    How long can you walk comfortably? few minutes    Patient Stated Goals Pt's goals for therapy are to get stronger for walking, transfers, bed mobiltiy    Currently in  Pain? Yes    Pain Score 8     Pain Location Back    Pain Orientation Lower    Pain Descriptors / Indicators Dull    Pain Type Chronic pain    Pain Onset More than a month ago    Aggravating Factors  getting up    Pain Relieving Factors getting off the bed                               [facility] Adult PT Treatment/Exercise - 09/06/20 1440       Transfers   Transfers Sit to Stand;Stand to Sit    Sit to Stand 4: Min assist;With upper extremity assist;From chair/3-in-1    Sit to Stand Details Visual cues/gestures for sequencing;Verbal cues for sequencing;Verbal cues for technique;Tactile cues for weight shifting;Tactile cues for initiation    Sit to Stand Details (indicate cue type and reason) cues to scoot towards edge of chair, proper BLE placement and incr anterior weight shift/nose over toes to stand, initially in standing pt with incr forward flexed posture, takes incr time to stand  up tall and grab onto [facility]. approx. 4-5 reps performed throughout session    Stand to Sit 4: Min assist;With upper extremity assist;To chair/3-in-1    Stand to Sit Details needs tactile/verbal cues to reach behind and sit back down to chair    Comments pt needing cues to fully back BLE up to chair prior to sitting down.      Ambulation/Gait   Ambulation/Gait Yes    Ambulation/Gait Assistance 4: Min guard;4: Min assist    Ambulation/Gait Assistance Details ambulating into session with SBQC with narrow BOS and narrow cane placement (almost tripping over it with R foot), pt ambulates with very shuffled steps and incr forward flexed posture. trialed RW in session (pt's daughter reports she has one at home but it doesnt have wheels), used smallest height on regular RW but pt would benefit from youth RW at next session for proper height. cues for posture and step length/taking BIG steps (pt did well with cue step one foot in front of the other), pt needing a couple standing rest breaks with RW due to  fatigue, but overall much better pattern and improved safety with gait with RW. does continue with shuffled steps when going around with turns, min A at times for steering. pt's daughter asking where to purchase - discussed getting an order from [facility] to go through insurance/medical supply store or they are also available on [place] will continue to practice before determining which would be best.    Ambulation Distance (Feet) 50 Feet   x1, 80 x 1   Assistive device Small based quad cane;Rolling walker    Gait Pattern Step-through pattern;Decreased step length - right;Decreased step length - left;Decreased stride length;Decreased dorsiflexion - right;Decreased dorsiflexion - left;Shuffle;Narrow base of support;Poor foot clearance - left;Poor foot clearance - right    Ambulation Surface Level;Indoor              Access Code: [email or web address] URL: [email or web address] Date: 09/06/2020 Prepared by: Janann August   Reviewed previous HEP given at eval (pt only performed 1x), educated on purpose of exercises and importance of compliance.  Exercises Seated March - 1-2 x daily - 7 x weekly - 1-2 sets - 10 reps - pt did well using visual cue of touching therapist's hand on how high to lift up leg, discussed with pt's daughter to use this cute at home.  Seated Long Arc Quad - 1-2 x daily - 7 x weekly - 1-2 sets - 10 reps Seated Ankle Dorsiflexion AROM - 1-2 x daily - 7 x weekly - 1-2 sets - 10 reps  New additions on 09/06/20:  Seated Heel Raise - 1-2 x daily - 7 x weekly - 1-2 sets - 10 reps Seated Active Hip Flexion - 1-2 x daily - 7 x weekly - 1-2 sets - 10 reps - leaning forward and then sitting up with tall posture, did well with use of mirror for visual cue on how to sit up tall, pt reporting decr pain in low back with this exercise.          PT Education - 09/07/20 0910     Education Details initial HEP.    Person(s) Educated Patient;Child(ren)    Methods  Explanation;Demonstration;Handout;Verbal cues    Comprehension Verbalized understanding;Returned demonstration              PT Short Term Goals - 09/01/20 1731       PT SHORT TERM GOAL #1   Title Pt will perform HEP with  family supervision for improved strength/flexibility, balance, decreased pain, transfers, and gait.  TARGET 09/24/2020    Time 4    Period Weeks    Status New      PT SHORT TERM GOAL #2   Title Pt will perform sit to stand transfer with minimal assistance in less than 12 seconds for improved efficiency and safety with transfers.    Baseline 1 sit to stand, mod assist, 18.03 sec    Time 4    Period Weeks    Status New      PT SHORT TERM GOAL #3   Title Pt will perform TUG in less than or equal to 120 seconds for imrpoved short distances with gait in the home.    Baseline 133.28 sec    Time 4    Period Weeks    Status New      PT SHORT TERM GOAL #4   Title Pt will ambulate at least 100 ft using appropriate device (cane versus walker) with min guard, for improved household gait.    Baseline 80 ft, quad cane, standing breaks    Time 4    Period Weeks    Status New      PT SHORT TERM GOAL #5   Title Pt/daughter will verbalize understanding of fall prevention in the home environment.    Time 4    Period Weeks    Status New               PT Long Term Goals - 09/01/20 1735       PT LONG TERM GOAL #1   Title Pt will perform HEP with family supervision for improved flexibility/strength, balance, transfers, and gait.  TARGET 10/22/2020    Time 8    Period Weeks    Status New      PT LONG TERM GOAL #2   Title Pt will perform sit to stand x 1 rep, supervision, in less than 8 secodns to demo improved transfer efficiency and functional strength.    Time 8    Period Weeks    Status New      PT LONG TERM GOAL #3   Title Pt will improve TUG score to less than or equal to 90 seconds for decreased fall risk, improved short distance mobility.    Time 8     Period Weeks    Status New      PT LONG TERM GOAL #4   Title Pt/daughter will report at least 25% improvement in bed mobility and car transfers.    Time 8    Period Weeks    Status New      PT LONG TERM GOAL #5   Title Pt will ambulate at least 250 ft using appropriate device (RW versus cane) with supervision for improved gait efficiency and safety in the home/community.    Time 8    Period Weeks    Status New                   Plan - 09/07/20 0920     Clinical Impression Statement Focus of today's skilled session was reviewing HEP given at eval and adding exercise for low back stretching/posture and continued BLE strengthening. Trialed use of RW today for incr safety with gait, with pt during straight aways able to demo a step through pattern with cues for bigger/longer steps. Did need a couple standing rest breaks due to fatigue. Will continue to practice with RW for incr  safety and gait efficiency vs. use of SBQC. Will continue to progress towards LTGs.    Personal Factors and Comorbidities Comorbidity 3+    Comorbidities PMH:  high cholesterol, HTN, mutiple myeloma, MSA dx 2017, orthostatic hypotension, OA    Examination-Activity Limitations Locomotion Level;Bed Mobility;Transfers;Stand    Examination-Participation Restrictions Community Activity;Shop    Stability/Clinical Decision Making Evolving/Moderate complexity    Rehab Potential Good    PT Frequency 2x / week    PT Duration 8 weeks   including eval week   PT Treatment/Interventions ADLs/Self Care Home Management;DME Instruction;Neuromuscular re-education;Balance training;Therapeutic exercise;Therapeutic activities;Functional mobility training;Gait training;Cryotherapy;Patient/family education;Manual techniques;Passive range of motion    PT Next Visit Plan how is HEP?  continue to work on Education administrator (scooting, foot placement, forward lean, push up to stand with hands); continue with RW with gait  (would need to use youth size), use of NuStep/SciFit for flexibility and lower extremity strength.  Will need to work on bed mobility, car transfers at some point    Consulted and Agree with Plan of Care Patient;Family member/caregiver    Family Member Consulted daughter             Patient will benefit from skilled therapeutic intervention in order to improve the following deficits and impairments:  Abnormal gait, Difficulty walking, Decreased activity tolerance, Decreased balance, Impaired flexibility, Decreased mobility, Decreased strength, Postural dysfunction  Visit Diagnosis: Muscle weakness (generalized)  Other abnormalities of gait and mobility  Unsteadiness on feet  Other symptoms and signs involving the nervous system     Problem List Patient Active Problem List   Diagnosis Date Noted   Low back pain 08/14/2020   Multiple myeloma ([place]) 10/24/2017   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   Spinal stenosis in cervical region 09/24/2014   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   Dysphagia, pharyngoesophageal phase 08/06/2014   PAC (premature atrial contraction) 06/09/2014   Tremor 05/21/2014   Nonspecific abnormal electrocardiogram (ECG) (EKG) 04/09/2014   Routine general medical examination at a health care facility 04/09/2014   Other screening mammogram 09/17/2013   Constipation 06/11/2013   OAB (overactive bladder) 06/11/2013   GERD (gastroesophageal reflux disease) 03/21/2012   MGUS (monoclonal gammopathy of unknown significance) 11/24/2011   Health care maintenance 09/12/2011   Vitamin D deficiency 08/10/2008   Osteoarthritis 03/09/2006   Hyperlipidemia with target LDL less than 130 02/06/2006   Essential hypertension 02/06/2006    Arliss Journey, PT, DPT  09/07/2020, 9:22 AM  [facility] [address] [place], [place],  [address] Phone: [phone]   Fax:  [phone]  Name: Brianna Pittman MRN: [id number] Date of Birth: 01/24/1941

## 2020-09-10 ENCOUNTER — Ambulatory Visit: Payer: Medicare (Managed Care) | Admitting: Physical Therapy

## 2020-09-10 ENCOUNTER — Other Ambulatory Visit: Payer: Self-pay

## 2020-09-10 DIAGNOSIS — R2689 Other abnormalities of gait and mobility: Secondary | ICD-10-CM | POA: Diagnosis not present

## 2020-09-10 DIAGNOSIS — R29818 Other symptoms and signs involving the nervous system: Secondary | ICD-10-CM

## 2020-09-10 DIAGNOSIS — M6281 Muscle weakness (generalized): Secondary | ICD-10-CM

## 2020-09-10 DIAGNOSIS — R2681 Unsteadiness on feet: Secondary | ICD-10-CM

## 2020-09-10 NOTE — Therapy (Signed)
[facility] [address] [place], [place], [address] Phone: [phone]   Fax:  [phone]  Physical Therapy Treatment  Patient Details  Name: Brianna Pittman MRN: [id number] Date of Birth: 1940/08/09 Referring Provider (PT): Dr. Lynne Leader   Encounter Date: 09/10/2020   PT End of Session - 09/10/20 1647     Visit Number 3    Number of Visits 16    Date for PT Re-Evaluation 10/22/20    Authorization Type [employer]    Progress Note Due on Visit 10    PT Start Time 1315    PT Stop Time 1400    PT Time Calculation (min) 45 min    Equipment Utilized During Treatment Gait belt    Activity Tolerance Patient tolerated treatment well    Behavior During Therapy WFL for tasks assessed/performed             Past Medical History:  Diagnosis Date   Allergy    Anemia    Anxiety    Benign neoplasm of unspecified site    Cataract    Chest pain    Colon polyps    Depression    h/o depression   Depression with anxiety    Esophageal reflux    Hyperlipidemia    Hypertension    Osteoarthrosis, unspecified whether generalized or localized, unspecified site    Ulcer    Vitamin D deficiency     Past Surgical History:  Procedure Laterality Date   ABDOMINAL HYSTERECTOMY  1995   breast reduction     cataracts     both eyes   CESAREAN SECTION     x 4   tummy tuck      There were no vitals filed for this visit.   Subjective Assessment - 09/10/20 1325     Subjective Pt states she's been doing her exercises but not all at once. Pt reports difficulty bending her knee back.    Patient is accompained by: Family member   daughter   Pertinent History PMH:  high cholesterol, HTN, mutiple myeloma, MSA dx 2017, orthostatic hypotension, OA    Limitations Walking;Standing    How long can you walk comfortably? few minutes    Patient Stated Goals Pt's goals for therapy are to get stronger for walking,  transfers, bed mobiltiy    Currently in Pain? No/denies    Pain Onset More than a month ago                               [facility] Adult PT Treatment/Exercise - 09/10/20 0001       Transfers   Sit to Stand 4: Min assist;With upper extremity assist;From chair/3-in-1    Sit to Stand Details Visual cues/gestures for sequencing;Verbal cues for sequencing;Verbal cues for technique;Tactile cues for weight shifting;Tactile cues for initiation    Sit to Stand Details (indicate cue type and reason) cues for forward scooting/weight shifting, foot placement, forward trunk lean and pushing through UEs,    Stand to Sit 4: Min assist;With upper extremity assist;To chair/3-in-1    Stand to Sit Details tactile cues to control descent    Comments Worked on forward and backwards scooting x 5 reps; lateral weight shifts with reach to improve scooting x10 reps      Ambulation/Gait   Ambulation/Gait Assistance 4: Min guard;4: Min Engineer, drilling (Feet) [place]  device Small based quad cane;Rolling walker    Gait Pattern Step-through pattern;Decreased step length - right;Decreased step length - left;Decreased stride length;Decreased dorsiflexion - right;Decreased dorsiflexion - left;Shuffle;Narrow base of support;Poor foot clearance - left;Poor foot clearance - right    Ambulation Surface Level;Indoor    Gait Comments cues for bigger steps and heel strike      Therapeutic Activites    Therapeutic Activities Other Therapeutic Activities;ADL's    Other Therapeutic Activities Worked on bed mobility/scooting with min/mod A x 5 reps. Worked on sup<>sit transfers x 10 reps      Exercises   Exercises Knee/Hip      Knee/Hip Exercises: Stretches   Hip Flexor Stretch Right;Left;30 seconds    Hip Flexor Stretch Limitations supine      Knee/Hip Exercises: Seated   Long Arc Quad Strengthening;Both;2 sets;10 reps    Long Arc Quad Weight 1 lbs.    Other Seated Knee/Hip  Exercises heel/toe raise 2x10    Marching Strengthening;Both;10 reps    Sit to Sand 15 reps;with UE support      Knee/Hip Exercises: Supine   Bridges Strengthening;1 set;10 reps                      PT Short Term Goals - 09/01/20 1731       PT SHORT TERM GOAL #1   Title Pt will perform HEP with family supervision for improved strength/flexibility, balance, decreased pain, transfers, and gait.  TARGET 09/24/2020    Time 4    Period Weeks    Status New      PT SHORT TERM GOAL #2   Title Pt will perform sit to stand transfer with minimal assistance in less than 12 seconds for improved efficiency and safety with transfers.    Baseline 1 sit to stand, mod assist, 18.03 sec    Time 4    Period Weeks    Status New      PT SHORT TERM GOAL #3   Title Pt will perform TUG in less than or equal to 120 seconds for imrpoved short distances with gait in the home.    Baseline 133.28 sec    Time 4    Period Weeks    Status New      PT SHORT TERM GOAL #4   Title Pt will ambulate at least 100 ft using appropriate device (cane versus walker) with min guard, for improved household gait.    Baseline 80 ft, quad cane, standing breaks    Time 4    Period Weeks    Status New      PT SHORT TERM GOAL #5   Title Pt/daughter will verbalize understanding of fall prevention in the home environment.    Time 4    Period Weeks    Status New               PT Long Term Goals - 09/01/20 1735       PT LONG TERM GOAL #1   Title Pt will perform HEP with family supervision for improved flexibility/strength, balance, transfers, and gait.  TARGET 10/22/2020    Time 8    Period Weeks    Status New      PT LONG TERM GOAL #2   Title Pt will perform sit to stand x 1 rep, supervision, in less than 8 secodns to demo improved transfer efficiency and functional strength.    Time 8    Period Weeks  Status New      PT LONG TERM GOAL #3   Title Pt will improve TUG score to less than or equal  to 90 seconds for decreased fall risk, improved short distance mobility.    Time 8    Period Weeks    Status New      PT LONG TERM GOAL #4   Title Pt/daughter will report at least 25% improvement in bed mobility and car transfers.    Time 8    Period Weeks    Status New      PT LONG TERM GOAL #5   Title Pt will ambulate at least 250 ft using appropriate device (RW versus cane) with supervision for improved gait efficiency and safety in the home/community.    Time 8    Period Weeks    Status New                   Plan - 09/10/20 1644     Clinical Impression Statement Reviewed HEP -- added stretching for hip flexors and quads. Worked primarily with bed mobility, sup<>sit, and sit<>stand transfers. Able to amb with RW utilizing larger step pattern this session with continued cueing. Nu-step occupied; was not able to try this session.    Personal Factors and Comorbidities Comorbidity 3+    Comorbidities PMH:  high cholesterol, HTN, mutiple myeloma, MSA dx 2017, orthostatic hypotension, OA    Examination-Activity Limitations Locomotion Level;Bed Mobility;Transfers;Stand    Examination-Participation Restrictions Community Activity;Shop    Stability/Clinical Decision Making Evolving/Moderate complexity    Rehab Potential Good    PT Frequency 2x / week    PT Duration 8 weeks   including eval week   PT Treatment/Interventions ADLs/Self Care Home Management;DME Instruction;Neuromuscular re-education;Balance training;Therapeutic exercise;Therapeutic activities;Functional mobility training;Gait training;Cryotherapy;Patient/family education;Manual techniques;Passive range of motion    PT Next Visit Plan how is HEP?  continue to work on Education administrator (scooting, foot placement, forward lean, push up to stand with hands); continue with RW with gait (would need to use youth size), use of NuStep/SciFit for flexibility and lower extremity strength. Continue to work on bed  mobility. Will need to work on car transfers at some point    [place] and Agree with Plan of Care Patient;Family member/caregiver    Family Member Consulted daughter             Patient will benefit from skilled therapeutic intervention in order to improve the following deficits and impairments:  Abnormal gait, Difficulty walking, Decreased activity tolerance, Decreased balance, Impaired flexibility, Decreased mobility, Decreased strength, Postural dysfunction  Visit Diagnosis: Muscle weakness (generalized)  Other abnormalities of gait and mobility  Unsteadiness on feet  Other symptoms and signs involving the nervous system     Problem List Patient Active Problem List   Diagnosis Date Noted   Low back pain 08/14/2020   Multiple myeloma ([place]) 10/24/2017   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   Spinal stenosis in cervical region 09/24/2014   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   Dysphagia, pharyngoesophageal phase 08/06/2014   PAC (premature atrial contraction) 06/09/2014   Tremor 05/21/2014   Nonspecific abnormal electrocardiogram (ECG) (EKG) 04/09/2014   Routine general medical examination at a health care facility 04/09/2014   Other screening mammogram 09/17/2013   Constipation 06/11/2013   OAB (overactive bladder) 06/11/2013   GERD (gastroesophageal reflux disease) 03/21/2012   MGUS (monoclonal gammopathy of  unknown significance) 11/24/2011   Health care maintenance 09/12/2011   Vitamin D deficiency 08/10/2008   Osteoarthritis 03/09/2006   Hyperlipidemia with target LDL less than 130 02/06/2006   Essential hypertension 02/06/2006    Rome Echavarria April Ma L Deshler PT, DPT 09/10/2020, 4:48 PM  [facility] [address] [place], [place], [address] Phone: [phone]   Fax:  [phone]  Name: Brianna Pittman MRN: [id number] Date of Birth: 1940/11/13

## 2020-09-10 NOTE — Progress Notes (Signed)
Assessment/Plan:   1.  Parkinson's disease with dysautonomia  -Patient had symptoms back at least until 2015 when she saw Dr. Delice Lesch.  She likely has Parkinson's disease, especially with a strong family history of such (half brother and father with Parkinson's disease).  The diagnosis of MSA was brought into question when she was living in [place] because she had had multiple syncopal episodes.  I think it is unlikely, and it is more likely that she had Parkinson's disease with associated dysautonomia.  -Patient will continue carbidopa/levodopa 25/100, 2 tablets at 7 AM/noon/5 PM  -We will continue with physical therapy.  -Met with my LCSW today.  2.  Neurogenic orthostatic hypotension with syncope  -Records indicate patient had been on Florinef in the past and had developed significant hypertension and that had to be removed.  We will need to monitor.  -The way that the patient's daughter describes the symptoms as very suspicious for seizure, but her daughter states that an extensive work-up was undertaken in [place] including for seizure.  I could not find those records.  I did not see where neuroimaging was completed (with the exception of with Dr. Delice Lesch years ago).  I could not find an EEG, but certainly believe her daughter that a work-up was undertaken.  I wonder if it was a different hospital system that was linked in care everywhere.  Her daughter is a good historian and states that they were told that the episodes were syncope.  We may need to investigate these further in the future if they continue and continue to sound suspicious.  3.  Eyelid opening apraxia  -Discussed nature and pathophysiology.  Discussed value of Botox.  Discussed risks and benefits.  Discussed black box warning.  Initially, patient was very interested in it, but then stated later that she really wanted to think about that.  They can talk it over and let me know if they would like to proceed.  4.  Probable  PDD  -Patient has 24 hour/day care.  Daughter provides most of the care, and patient's sister helps as well.   Subjective:   Brianna Pittman was seen today in the movement disorders clinic for neurologic consultation at the request of Biagio Borg, MD.  The consultation is for the evaluation of parkinsonism.  Pt with daughter who supplements the history.  Patient was seen by Dr. Delice Lesch back in 2016.  Notes are reviewed.  At that point in time, she had right hand rest tremor, cogwheel rigidity on the right and was taking carbidopa/levodopa 25/100, 2 tablets before breakfast and 1 tablet before lunch.  Patient is a 80 year old female with a long history of chronic pain, hypertension, hyperlipidemia, multiple myeloma, neutropenia, anemia who presents for evaluation of Parkinson's disease, symptoms dating back at least to 2015 (per Dr. Lars Masson examination).    Pt moved to [place] and sought care there after Dr. Delice Lesch.  Her daughter states that she was cared for there starting in about 2016.  I was able to review notes, but the only note that I had from neurology was from February, 2022 and May, 2022.  It appears that in February, 2022 they thought that perhaps the patient had MSA (because of recurrent syncope).  Patient was given Florinef, but apparently developed significant hypertension with it (do not know the dose).  She is now on carbidopa/levodopa 25/100, 2 tid (7am/noon/5pm).     Specific Symptoms:  Tremor: Yes.  , R>L hand tremor Family  hx of similar:  Yes.   half brother with Parkinson's disease and father had Parkinsons Disease  Voice: very soft Sleep:   Vivid Dreams:  Yes.    Acting out dreams:  Yes.   Wet Pillows: No. Postural symptoms:  Yes.  , uses walker and cane  Falls?  Yes.  , some falls slipping off of the chucks from the bed but no major falls in last 6 months to one year Bradykinesia symptoms: shuffling gait, slow movements, difficulty getting out of a chair, and  difficulty regaining balance Urinary Incontinence:  Yes.   Difficulty Swallowing:  Yes.   Per pt but daughter has not noted (daughter cuts up food for her) Trouble with ADL's:  Yes.  , needs assist x 3 years Memory changes:  Yes.  , daughter with 24 hours per day care (or patients sister assists) Hallucinations:  Yes.   (Doesn't happen daily; happens if BP high or if has "syncope") Syncope: Yes.  , daughter states that she may just "go into syncope."  States that speech pattern will change and she has a far off look and words slur and they may get a wet paper towel and it may help and she will drool.  She used to urinate on herself but now doesn't.  She states that she will slump.  It happened one time while in the hospital.  States that she had extensive testing in the hospital and told it was syncope due to blood pressure.   Diplopia:  Yes.     Last MRI brain that I have access to was ordered by Dr. Delice Lesch in 2015, done with and without gadolinium.  There was mild small vessel disease.  PREVIOUS MEDICATIONS: Sinemet  ALLERGIES:   Allergies  Allergen Reactions   Citrus Other (See Comments)    Upset stomach   Ibuprofen     REACTION: itching   Other Other (See Comments)    Coffee:severe acid reflux    CURRENT MEDICATIONS:  Current Outpatient Medications  Medication Instructions   amLODipine (NORVASC) 10 mg, Oral, Daily   carbidopa-levodopa (SINEMET IR) 25-100 MG tablet Take 2 tablets before breakfast, then 1 tablet before lunch   cholecalciferol (VITAMIN D3) 5,000 Units, Oral, Daily   Ferrous Sulfate Dried (SLOW IRON PO) 1 tablet, Oral, Daily   gabapentin (NEURONTIN) 100 mg, Oral, 3 times daily   naproxen sodium (ALEVE) 440 mg, Oral, Daily PRN   nebivolol (BYSTOLIC) 5 mg, Oral, Daily   simvastatin (ZOCOR) 20 mg, Oral, Daily   traMADol (ULTRAM) 50 mg, Oral, Every 6 hours PRN    Objective:   VITALS:   Vitals:   09/14/20 0925  BP: 110/62  Pulse: 65  SpO2: 98%  Weight: 172  lb (78 kg)  Height: _0  (1.626 m)    GEN:  The patient appears stated age and is in NAD. HEENT:  Normocephalic, atraumatic.  The mucous membranes are moist. The superficial temporal arteries are without ropiness or tenderness. CV:  RRR Lungs:  CTAB Neck/HEME:  There are no carotid bruits bilaterally.  Neurological examination:  Orientation: The patient is alert and answers most of my questions.  She looks to her daughter for finer aspects of the history. Cranial nerves: There is good facial symmetry. Extraocular muscles are intact.  Eyes are closed most of the visit.  The visual fields are full to confrontational testing. The speech is fluent and quite hypophonic. Soft palate rises symmetrically and there is no tongue deviation. Hearing is intact  to conversational tone. Sensation: Sensation is intact to light and pinprick throughout (facial, trunk, extremities). Vibration is intact at the bilateral big toe. There is no extinction with double simultaneous stimulation. There is no sensory dermatomal level identified. Motor: Strength is 5/5 in the bilateral upper and lower extremities.   Shoulder shrug is equal and symmetric.  There is no pronator drift. Deep tendon reflexes: Deep tendon reflexes are 1/4 at the bilateral biceps, triceps, brachioradialis, patella and achilles. Plantar responses are downgoing bilaterally.  Movement examination: Tone: There is mild to moderate increased tone in the right upper/right lower extremity. Abnormal movements: there is RUE rest tremor, mild and rare Coordination:  There is mod decremation with RAM's, with any form of RAMS, including alternating supination and pronation of the forearm, hand opening and closing, finger taps, heel taps and toe taps.  There is some apraxia when asked to perform these movements as well. Gait and Station: The patient is assisted out of the transport chair.  She is given a walker.  She is short stepped with some apraxia of gait.    I have reviewed and interpreted the following labs independently   Chemistry      Component Value Date/Time   NA 142 10/14/2017 1002   NA 139 11/26/2014 1340   K 4.3 10/14/2017 1002   K 4.3 11/26/2014 1340   CL 109 10/14/2017 1002   CL 106 10/20/2011 1457   CO2 26 10/14/2017 1002   CO2 27 11/26/2014 1340   BUN 22 10/14/2017 1002   BUN 14.4 11/26/2014 1340   CREATININE 1.11 (H) 10/14/2017 1002   CREATININE 0.9 11/26/2014 1340      Component Value Date/Time   CALCIUM 8.8 (L) 10/14/2017 1002   CALCIUM 8.8 11/26/2014 1340   ALKPHOS 66 11/26/2014 1340   AST 18 11/26/2014 1340   ALT 16 11/26/2014 1340   BILITOT 0.51 11/26/2014 1340      Lab Results  Component Value Date   TSH 1.77 04/09/2014   Lab Results  Component Value Date   WBC 3.5 (L) 10/14/2017   HGB 11.6 (L) 10/14/2017   HCT 34.9 (L) 10/14/2017   MCV 95.9 10/14/2017   PLT 171 10/14/2017     Total time spent on today's visit was 63 minutes, including both face-to-face time and nonface-to-face time.  Time included that spent on review of records (prior notes available to me/labs/imaging if pertinent), discussing treatment and goals, answering patient's questions and coordinating care.  Cc:  Janith Lima, MD

## 2020-09-14 ENCOUNTER — Ambulatory Visit (INDEPENDENT_AMBULATORY_CARE_PROVIDER_SITE_OTHER): Payer: Medicare (Managed Care) | Admitting: Neurology

## 2020-09-14 ENCOUNTER — Encounter: Payer: Self-pay | Admitting: Neurology

## 2020-09-14 ENCOUNTER — Other Ambulatory Visit: Payer: Self-pay

## 2020-09-14 VITALS — BP 110/62 | HR 65 | Ht 64.0 in | Wt 172.0 lb

## 2020-09-14 DIAGNOSIS — G903 Multi-system degeneration of the autonomic nervous system: Secondary | ICD-10-CM

## 2020-09-14 DIAGNOSIS — R482 Apraxia: Secondary | ICD-10-CM | POA: Diagnosis not present

## 2020-09-14 DIAGNOSIS — F0280 Dementia in other diseases classified elsewhere without behavioral disturbance: Secondary | ICD-10-CM | POA: Diagnosis not present

## 2020-09-14 DIAGNOSIS — G20A1 Parkinson's disease without dyskinesia, without mention of fluctuations: Secondary | ICD-10-CM

## 2020-09-14 DIAGNOSIS — G20 Parkinson's disease: Secondary | ICD-10-CM | POA: Diagnosis not present

## 2020-09-14 NOTE — Patient Instructions (Signed)
We discussed botox for the eyelids.  You can think about this and let us know if you are interested You will continue on current medications for now Exercise!  The physicians and staff at [facility] are committed to providing excellent care. You may receive a survey requesting feedback about your experience at our office. We strive to receive "very good" responses to the survey questions. If you feel that your experience would prevent you from giving the office a "very good " response, please contact our office to try to remedy the situation. We may be reached at [phone]. Thank you for taking the time out of your busy day to complete the survey.

## 2020-09-20 ENCOUNTER — Other Ambulatory Visit: Payer: Self-pay | Admitting: Internal Medicine

## 2020-09-20 ENCOUNTER — Telehealth: Payer: Self-pay | Admitting: Internal Medicine

## 2020-09-20 DIAGNOSIS — G8929 Other chronic pain: Secondary | ICD-10-CM

## 2020-09-20 DIAGNOSIS — M159 Polyosteoarthritis, unspecified: Secondary | ICD-10-CM

## 2020-09-20 DIAGNOSIS — M150 Primary generalized (osteo)arthritis: Secondary | ICD-10-CM

## 2020-09-20 DIAGNOSIS — M8949 Other hypertrophic osteoarthropathy, multiple sites: Secondary | ICD-10-CM

## 2020-09-20 MED ORDER — TRAMADOL HCL 50 MG PO TABS: 50.0000 mg | ORAL_TABLET | Freq: Two times a day (BID) | ORAL | 3 refills | Status: DC | PRN

## 2020-09-20 NOTE — Telephone Encounter (Signed)
1.Medication Requested: traMADol (ULTRAM) 50 MG tablet  2. Pharmacy (Name, Street, [place]): [facility], [place]  3. On Med List: yes  4. Last Visit with PCP: 08/13/2020  5. Next visit date with PCP: n/a   Agent: Please be advised that RX refills may take up to 3 business days. We ask that you follow-up with your pharmacy.

## 2020-09-22 ENCOUNTER — Ambulatory Visit: Payer: Medicare (Managed Care) | Admitting: Physical Therapy

## 2020-09-29 ENCOUNTER — Encounter: Payer: Self-pay | Admitting: Physical Therapy

## 2020-09-29 ENCOUNTER — Ambulatory Visit: Payer: Medicare (Managed Care) | Attending: Family Medicine | Admitting: Physical Therapy

## 2020-09-29 ENCOUNTER — Other Ambulatory Visit: Payer: Self-pay

## 2020-09-29 DIAGNOSIS — R2681 Unsteadiness on feet: Secondary | ICD-10-CM | POA: Insufficient documentation

## 2020-09-29 DIAGNOSIS — M6281 Muscle weakness (generalized): Secondary | ICD-10-CM | POA: Diagnosis present

## 2020-09-29 DIAGNOSIS — R2689 Other abnormalities of gait and mobility: Secondary | ICD-10-CM | POA: Insufficient documentation

## 2020-09-29 DIAGNOSIS — R29818 Other symptoms and signs involving the nervous system: Secondary | ICD-10-CM | POA: Diagnosis present

## 2020-09-29 NOTE — Therapy (Signed)
[facility] [address] [place], [place], [address] Phone: [phone]   Fax:  [phone]  Physical Therapy Treatment  Patient Details  Name: Brianna Pittman MRN: [id number] Date of Birth: 1940-05-11 Referring Provider (PT): Dr. Lynne Leader   Encounter Date: 09/29/2020   PT End of Session - 09/29/20 1634     Visit Number 4    Number of Visits 16    Date for PT Re-Evaluation 10/22/20    Authorization Type [employer]age    Progress Note Due on Visit 10    PT Start Time 6962    PT Stop Time 1615    PT Time Calculation (min) 44 min    Equipment Utilized During Treatment Gait belt    Activity Tolerance Patient tolerated treatment well    Behavior During Therapy WFL for tasks assessed/performed             Past Medical History:  Diagnosis Date   Allergy    Anemia    Anxiety    Benign neoplasm of unspecified site    Cataract    Chest pain    Colon polyps    Depression    h/o depression   Depression with anxiety    Esophageal reflux    Hyperlipidemia    Hypertension    Osteoarthrosis, unspecified whether generalized or localized, unspecified site    Ulcer    Vitamin D deficiency     Past Surgical History:  Procedure Laterality Date   ABDOMINAL HYSTERECTOMY  1995   breast reduction     cataracts     both eyes   CESAREAN SECTION     x 4   tummy tuck      There were no vitals filed for this visit.   Subjective Assessment - 09/29/20 1542     Subjective Reports she has not been moving as well the past couple of weeks.    Patient is accompained by: Family member   daughter   Pertinent History PMH:  high cholesterol, HTN, mutiple myeloma, MSA dx 2017, orthostatic hypotension, OA    Limitations Walking;Standing    How long can you walk comfortably? few minutes    Patient Stated Goals Pt's goals for therapy are to get stronger for walking, transfers, bed mobiltiy    Currently in Pain?  No/denies    Pain Onset More than a month ago                [facility] PT Assessment - 09/29/20 1552       Timed Up and Go Test   Normal TUG (seconds) 134.22    TUG Comments with RW                           [facility] Adult PT Treatment/Exercise - 09/29/20 1602       Transfers   Transfers Sit to Stand;Stand to Sit    Sit to Stand 4: Min assist;With upper extremity assist;From chair/3-in-1    Sit to Stand Details Visual cues/gestures for sequencing;Verbal cues for sequencing;Verbal cues for technique;Tactile cues for weight shifting;Tactile cues for initiation    Sit to Stand Details (indicate cue type and reason) cues for scooting out towards edge of chair first, pt with incr difficulty doing this today, performed lateral weight shifting through hips first on mat to help initiate scooting which seemed to help (educated on giving pt's daughter this cue for pt at home), proper  foot placement and incr forward lean to stand up with BUE, performed approx. 6 reps throughout session. performed from chair and mat table    Stand to Sit 4: Min assist;With upper extremity assist;To chair/3-in-1    Stand to Sit Details verbal/tactile cues to reach back and sit down to chair, cues to fully back BLE up to mat table before sitting down      Ambulation/Gait   Ambulation/Gait Yes    Ambulation/Gait Assistance 4: Min guard;4: Min assist    Ambulation/Gait Assistance Details pt ambulated into clinic with [facility] (with one of its tips crooked and therapist had to fix it), had pt ambulate back into clinic with youth RW with cues for posture and step length (big kicks with legs)    Ambulation Distance (Feet) 100 Feet   x1, 80 x 2   Assistive device Rolling walker    Gait Pattern Step-through pattern;Decreased step length - right;Decreased step length - left;Decreased stride length;Decreased dorsiflexion - right;Decreased dorsiflexion - left;Shuffle;Narrow base of support;Poor foot clearance -  left;Poor foot clearance - right    Ambulation Surface Level;Indoor      Therapeutic Activites    Therapeutic Activities Other Therapeutic Activities;ADL's    Other Therapeutic Activities pt with improved balance and step length with RW and pt also liking it asking where to purchase. discussed can either do from [place] or go through insurance and showed where to get on [place] (daughter liked this option better). asking about another walker, will try a rollator at next session and determine best option for pt with likely it being the RW for safety                      PT Short Term Goals - 09/29/20 1544       PT SHORT TERM GOAL #1   Title Pt will perform HEP with family supervision for improved strength/flexibility, balance, decreased pain, transfers, and gait.  TARGET 09/24/2020    Time 4    Period Weeks    Status New      PT SHORT TERM GOAL #2   Title Pt will perform sit to stand transfer with minimal assistance in less than 12 seconds for improved efficiency and safety with transfers.    Baseline 1 sit to stand, mod assist, 18.03 sec, performed in 8.5 seconds with min A on 09/29/20    Time 4    Period Weeks    Status Achieved      PT SHORT TERM GOAL #3   Title Pt will perform TUG in less than or equal to 120 seconds for imrpoved short distances with gait in the home.    Baseline 133.28 sec; 134.22 seconds on 09/29/20 with RW.    Time 4    Period Weeks    Status Not Met      PT SHORT TERM GOAL #4   Title Pt will ambulate at least 100 ft using appropriate device (cane versus walker) with min guard, for improved household gait.    Baseline 80-100' with RW with min guard    Time 4    Period Weeks    Status Achieved      PT SHORT TERM GOAL #5   Title Pt/daughter will verbalize understanding of fall prevention in the home environment.    Time 4    Period Weeks    Status New               PT Long Term Goals -  09/01/20 1735       PT LONG TERM GOAL #1   Title  Pt will perform HEP with family supervision for improved flexibility/strength, balance, transfers, and gait.  TARGET 10/22/2020    Time 8    Period Weeks    Status New      PT LONG TERM GOAL #2   Title Pt will perform sit to stand x 1 rep, supervision, in less than 8 secodns to demo improved transfer efficiency and functional strength.    Time 8    Period Weeks    Status New      PT LONG TERM GOAL #3   Title Pt will improve TUG score to less than or equal to 90 seconds for decreased fall risk, improved short distance mobility.    Time 8    Period Weeks    Status New      PT LONG TERM GOAL #4   Title Pt/daughter will report at least 25% improvement in bed mobility and car transfers.    Time 8    Period Weeks    Status New      PT LONG TERM GOAL #5   Title Pt will ambulate at least 250 ft using appropriate device (RW versus cane) with supervision for improved gait efficiency and safety in the home/community.    Time 8    Period Weeks    Status New                   Plan - 09/29/20 1720     Clinical Impression Statement Assessed pt's STGs today. Pt did not meet STG #3. Performed TUG in 134.22 seconds with RW today (previously 133.28 seconds). Pt did meet STG #2 and #4. Pt able to perform single sit <> stand with min A in 8.5 seconds today. Continued to use RW for gait with pt able to demonstrate improved step length, but does need verbal cues throughout gait to maintain. Continued to work on sit <> stand transfers, with pt doing well with seated rocking through hips prior to scooting to help initiate. Will continue to progress towards LTGs.    Personal Factors and Comorbidities Comorbidity 3+    Comorbidities PMH:  high cholesterol, HTN, mutiple myeloma, MSA dx 2017, orthostatic hypotension, OA    Examination-Activity Limitations Locomotion Level;Bed Mobility;Transfers;Stand    Examination-Participation Restrictions Community Activity;Shop    Stability/Clinical Decision  Making Evolving/Moderate complexity    Rehab Potential Good    PT Frequency 2x / week    PT Duration 8 weeks   including eval week   PT Treatment/Interventions ADLs/Self Care Home Management;DME Instruction;Neuromuscular re-education;Balance training;Therapeutic exercise;Therapeutic activities;Functional mobility training;Gait training;Cryotherapy;Patient/family education;Manual techniques;Passive range of motion    PT Next Visit Plan check rest of STGs. continue to work on Education administrator (scooting, foot placement, forward lean, push up to stand with hands); continue with RW with gait (would need to use youth size and see about rollator), use of NuStep/SciFit for flexibility and lower extremity strength. Continue to work on bed mobility. Will need to work on car transfers at some point    [place] and Agree with Plan of Care Patient;Family member/caregiver    Family Member Consulted daughter             Patient will benefit from skilled therapeutic intervention in order to improve the following deficits and impairments:  Abnormal gait, Difficulty walking, Decreased activity tolerance, Decreased balance, Impaired flexibility, Decreased mobility,  Decreased strength, Postural dysfunction  Visit Diagnosis: Muscle weakness (generalized)  Other abnormalities of gait and mobility  Unsteadiness on feet     Problem List Patient Active Problem List   Diagnosis Date Noted   Low back pain 08/14/2020   Multiple myeloma ([place]) 10/24/2017   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   Spinal stenosis in cervical region 09/24/2014   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[place]) 08/18/2014   Dysphagia, pharyngoesophageal phase 08/06/2014   PAC (premature atrial contraction) 06/09/2014   Tremor 05/21/2014   Nonspecific abnormal electrocardiogram (ECG) (EKG) 04/09/2014   Routine general medical  examination at a health care facility 04/09/2014   Other screening mammogram 09/17/2013   Constipation 06/11/2013   OAB (overactive bladder) 06/11/2013   GERD (gastroesophageal reflux disease) 03/21/2012   MGUS (monoclonal gammopathy of unknown significance) 11/24/2011   Health care maintenance 09/12/2011   Vitamin D deficiency 08/10/2008   Osteoarthritis 03/09/2006   Hyperlipidemia with target LDL less than 130 02/06/2006   Essential hypertension 02/06/2006    Arliss Journey, PT, DPT  09/29/2020, 5:22 PM  [facility] [address] [place], [place], [address] Phone: [phone]   Fax:  [phone]  Name: Brianna Pittman MRN: [id number] Date of Birth: 15-Apr-1940

## 2020-10-01 ENCOUNTER — Encounter: Payer: Self-pay | Admitting: Physical Therapy

## 2020-10-01 ENCOUNTER — Other Ambulatory Visit: Payer: Self-pay

## 2020-10-01 ENCOUNTER — Ambulatory Visit: Payer: Medicare (Managed Care) | Admitting: Physical Therapy

## 2020-10-01 DIAGNOSIS — R2689 Other abnormalities of gait and mobility: Secondary | ICD-10-CM

## 2020-10-01 DIAGNOSIS — M6281 Muscle weakness (generalized): Secondary | ICD-10-CM | POA: Diagnosis not present

## 2020-10-01 DIAGNOSIS — R2681 Unsteadiness on feet: Secondary | ICD-10-CM

## 2020-10-01 DIAGNOSIS — R29818 Other symptoms and signs involving the nervous system: Secondary | ICD-10-CM

## 2020-10-01 NOTE — Therapy (Addendum)
[facility] [address] [place], [place], [address] Phone: [phone]   Fax:  [phone]  Physical Therapy Treatment  Patient Details  Name: Brianna Pittman MRN: [id number] Date of Birth: 09-Jan-1941 Referring Provider (PT): Dr. Lynne Leader   Encounter Date: 10/01/2020   PT End of Session - 10/01/20 1530     Visit Number 5    Number of Visits 16    Date for PT Re-Evaluation 10/22/20    Authorization Type [employer]    Progress Note Due on Visit 10    PT Start Time 1448    PT Stop Time 1528    PT Time Calculation (min) 40 min    Equipment Utilized During Treatment Gait belt    Activity Tolerance Patient tolerated treatment well    Behavior During Therapy WFL for tasks assessed/performed             Past Medical History:  Diagnosis Date   Allergy    Anemia    Anxiety    Benign neoplasm of unspecified site    Cataract    Chest pain    Colon polyps    Depression    h/o depression   Depression with anxiety    Esophageal reflux    Hyperlipidemia    Hypertension    Osteoarthrosis, unspecified whether generalized or localized, unspecified site    Ulcer    Vitamin D deficiency     Past Surgical History:  Procedure Laterality Date   ABDOMINAL HYSTERECTOMY  1995   breast reduction     cataracts     both eyes   CESAREAN SECTION     x 4   tummy tuck      There were no vitals filed for this visit.   Subjective Assessment - 10/01/20 1455     Subjective No changes, has been doing some of her exercises at home.    Patient is accompained by: Family member   daughter   Pertinent History PMH:  high cholesterol, HTN, mutiple myeloma, MSA dx 2017, orthostatic hypotension, OA    Limitations Walking;Standing    How long can you walk comfortably? few minutes    Patient Stated Goals Pt's goals for therapy are to get stronger for walking, transfers, bed mobiltiy    Currently in Pain? No/denies     Pain Onset More than a month ago                                10/01/20 1502  Transfers  Transfers Sit to Stand;Stand to Sit  Sit to Stand 4: Min guard;With upper extremity assist  Sit to Stand Details Visual cues/gestures for sequencing;Verbal cues for sequencing;Verbal cues for technique;Tactile cues for weight shifting;Tactile cues for initiation  Sit to Stand Details (indicate cue type and reason) initial cues for scooting out towards edge (using rocking to initially help initiate movement), proper foot placement and incr forward weight shift (nose over toes). pt doing much better with transfer today  Stand to Sit 4: Min guard;With upper extremity assist  Stand to Sit Details cues to back up fully to mat table and reach back  Ambulation/Gait  Ambulation/Gait Yes  Ambulation/Gait Assistance 4: Min guard  Ambulation/Gait Assistance Details with youth RW (brought clinic RW out to pt in waiting room instead of walking back with pt's [facility]). ambulated in and out of clinic plus x1 lap in therapy gym, needing  cues throughout for posture and incr step length B (pt able to maintain big steps for a few strides especially during straightaways, decr step length when rounding turns), consistent cues throughout for BIG steps and stepping one foot past the other. pt's daughter to order a youth RW from [place] this weekend. discussed proper height when RW arrives  Ambulation Distance (Feet) 115 Feet (80 x 2)  Assistive device Rolling walker  Gait Pattern Step-through pattern;Decreased step length - right;Decreased step length - left;Decreased stride length;Decreased dorsiflexion - right;Decreased dorsiflexion - left;Shuffle;Narrow base of support;Poor foot clearance - left;Poor foot clearance - right  Ambulation Surface Level;Indoor  Gait Comments pt's daughter to order pt a RW for home. provided tennis balls when pt's RW comes in  Therapeutic Activites   Therapeutic Activities  Other Therapeutic Activities  Other Therapeutic Activities showed pt's daughter how to put on gait belt properly around pt and how to properly guard (pt's daughter brought theirs in from home and was asking)     Access Code: [email or web address] URL: [email or web address] Date: 10/01/2020 Prepared by: Janann August  Reviewed pt's HEP with pt and pt's daughter as pt is not consistently doing at home:   Exercises Seated March - 1-2 x daily - 7 x weekly - 1-2 sets - 10 reps Seated Long Arc Quad - 1-2 x daily - 7 x weekly - 1-2 sets - 10 reps Seated Ankle Dorsiflexion AROM - 1-2 x daily - 7 x weekly - 1-2 sets - 10 reps Seated Heel Raise - 1-2 x daily - 7 x weekly - 1-2 sets - 10 reps Seated Active Hip Flexion - 1-2 x daily - 7 x weekly - 1-2 sets - 10 reps    Seated step over yardstick for hip flexion/ABD to help with getting in and out of the car x10 reps, then making yardstick taller x10 reps- cues for incr foot clearance. Discussed purpose of this exercise to simulate getting in and out of the car with bigger movements. Pt reporting really enjoying this exercise.         PT Short Term Goals - 09/29/20 1544       PT SHORT TERM GOAL #1   Title Pt will perform HEP with family supervision for improved strength/flexibility, balance, decreased pain, transfers, and gait.  TARGET 09/24/2020    Time 4    Period Weeks    Status New      PT SHORT TERM GOAL #2   Title Pt will perform sit to stand transfer with minimal assistance in less than 12 seconds for improved efficiency and safety with transfers.    Baseline 1 sit to stand, mod assist, 18.03 sec, performed in 8.5 seconds with min A on 09/29/20    Time 4    Period Weeks    Status Achieved      PT SHORT TERM GOAL #3   Title Pt will perform TUG in less than or equal to 120 seconds for imrpoved short distances with gait in the home.    Baseline 133.28 sec; 134.22 seconds on 09/29/20 with RW.    Time 4    Period Weeks    Status  Not Met      PT SHORT TERM GOAL #4   Title Pt will ambulate at least 100 ft using appropriate device (cane versus walker) with min guard, for improved household gait.    Baseline 80-100' with RW with min guard    Time 4    Period Weeks  Status Achieved      PT SHORT TERM GOAL #5   Title Pt/daughter will verbalize understanding of fall prevention in the home environment.    Time 4    Period Weeks    Status New               PT Long Term Goals - 09/01/20 1735       PT LONG TERM GOAL #1   Title Pt will perform HEP with family supervision for improved flexibility/strength, balance, transfers, and gait.  TARGET 10/22/2020    Time 8    Period Weeks    Status New      PT LONG TERM GOAL #2   Title Pt will perform sit to stand x 1 rep, supervision, in less than 8 secodns to demo improved transfer efficiency and functional strength.    Time 8    Period Weeks    Status New      PT LONG TERM GOAL #3   Title Pt will improve TUG score to less than or equal to 90 seconds for decreased fall risk, improved short distance mobility.    Time 8    Period Weeks    Status New      PT LONG TERM GOAL #4   Title Pt/daughter will report at least 25% improvement in bed mobility and car transfers.    Time 8    Period Weeks    Status New      PT LONG TERM GOAL #5   Title Pt will ambulate at least 250 ft using appropriate device (RW versus cane) with supervision for improved gait efficiency and safety in the home/community.    Time 8    Period Weeks    Status New                 10/03/20 1525  Plan  Clinical Impression Statement Pt did not meet STG #1 - has not been performing HEP consistently at home, reviewed with pt and pt's daughter present. Pt reporting feeling good after performing the exercises. Pt with improved sit <> stand transfers today from mat table, needing min guard, but does still need cues for proper set up. Continued to ambulate with youth RW with pt able to work  on incr stride length with consistent cues. Pt's daughter to order one for pt.  Personal Factors and Comorbidities Comorbidity 3+  Comorbidities PMH:  high cholesterol, HTN, mutiple myeloma, MSA dx 2017, orthostatic hypotension, OA  Examination-Activity Limitations Locomotion Level;Bed Mobility;Transfers;Stand  Examination-Participation Restrictions Community Activity;Shop  Pt will benefit from skilled therapeutic intervention in order to improve on the following deficits Abnormal gait;Difficulty walking;Decreased activity tolerance;Decreased balance;Impaired flexibility;Decreased mobility;Decreased strength;Postural dysfunction  Stability/Clinical Decision Making Evolving/Moderate complexity  Rehab Potential Good  PT Frequency 2x / week  PT Duration 8 weeks (including eval week)  PT Treatment/Interventions ADLs/Self Care Home Management;DME Instruction;Neuromuscular re-education;Balance training;Therapeutic exercise;Therapeutic activities;Functional mobility training;Gait training;Cryotherapy;Patient/family education;Manual techniques;Passive range of motion  PT Next Visit Plan check fall prevention (forgot to do this today) continue to work on sit <> stands, continue with RW with gait (youth RW, did pt's come in?) use of NuStep/SciFit for flexibility and lower extremity strength. Continue to work on bed mobility. Will need to work on car transfers at some point  Happy and Agree with Plan of Care Patient;Family member/caregiver  Family Member Consulted daughter       Patient will benefit from skilled therapeutic intervention in order to  improve the following deficits and impairments:     Visit Diagnosis: Other abnormalities of gait and mobility  Muscle weakness (generalized)  Unsteadiness on feet  Other symptoms and signs involving the nervous system     Problem List Patient Active Problem List   Diagnosis Date Noted   Low back pain  08/14/2020   Multiple myeloma ([place]) 10/24/2017   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) 08/18/2014   Dysphagia, pharyngoesophageal phase 08/06/2014   PAC (premature atrial contraction) 06/09/2014   Tremor 05/21/2014   Nonspecific abnormal electrocardiogram (ECG) (EKG) 04/09/2014   Routine general medical examination at a health care facility 04/09/2014   Other screening mammogram 09/17/2013   Constipation 06/11/2013   OAB (overactive bladder) 06/11/2013   GERD (gastroesophageal reflux disease) 03/21/2012   MGUS (monoclonal gammopathy of unknown significance) 11/24/2011   Health care maintenance 09/12/2011   Vitamin D deficiency 08/10/2008   Osteoarthritis 03/09/2006   Hyperlipidemia with target LDL less than 130 02/06/2006   Essential hypertension 02/06/2006    Arliss Journey, PT, DPT  10/01/2020, 3:32 PM  [facility] [address] [place], [place], [address] Phone: [phone]   Fax:  [phone]  Name: Joane Postel MRN: [id number] Date of Birth: 1940/03/20

## 2020-10-04 ENCOUNTER — Ambulatory Visit: Payer: Medicare (Managed Care) | Admitting: Physical Therapy

## 2020-10-04 ENCOUNTER — Telehealth: Payer: Self-pay | Admitting: Physical Therapy

## 2020-10-04 NOTE — Telephone Encounter (Signed)
Spoke with pt's daughter regarding missed PT appointment today.  She reports forgetting to call to cancel.  Reminded pt of next scheduled PT appointment on Thursday at 2 pm.  Mady Haagensen, PT 10/04/20 1:56 PM Phone: [phone] Fax: [phone]

## 2020-10-05 ENCOUNTER — Ambulatory Visit: Payer: Medicare (Managed Care) | Admitting: Family Medicine

## 2020-10-05 NOTE — Progress Notes (Deleted)
   I, Brianna Pittman, LAT, ATC acting as a scribe for Brianna Leader, MD.  Brianna Pittman is a 80 y.o. female who presents to [employer] [facility] today for f/u chronic bilateral knee pain and LBP. Of note, pt has a medical hx significant for multiple myeloma and multisystem atrophy with Parkinson's disease. She has difficulty with gait and ambulation and has had frequent falls. Pt was last seen by Dr. Georgina Pittman on 08/17/20 and was given bilat knee steroid injections and was referred to PT, of which she's completed 5 visits. Today, pt reports   Dx imaging: 08/17/20 L-spine, R & L knee XR 05/01/19 L-spine XR             11/23/08 R knee XR             06/03/08 R knee XR             05/15/00 L-spine MRI  Pertinent review of systems: ***  Relevant historical information: ***   Exam:  LMP 07/24/1993  General: Well Developed, well nourished, and in no acute distress.   MSK: ***    Lab and Radiology Results No results found for this or any previous visit (from the past 72 hour(s)). No results found.     Assessment and Plan: 80 y.o. female with ***   PDMP not reviewed this encounter. No orders of the defined types were placed in this encounter.  No orders of the defined types were placed in this encounter.    Discussed warning signs or symptoms. Please see discharge instructions. Patient expresses understanding.   ***

## 2020-10-06 ENCOUNTER — Telehealth: Payer: Self-pay | Admitting: Neurology

## 2020-10-06 NOTE — Telephone Encounter (Signed)
Patient's daughter called in stating her mother is ready to get Botox for her eyelids. She is curious how to get the process started?

## 2020-10-07 ENCOUNTER — Ambulatory Visit: Payer: Medicare (Managed Care) | Admitting: Physical Therapy

## 2020-10-11 ENCOUNTER — Ambulatory Visit: Payer: Medicare (Managed Care) | Admitting: Physical Therapy

## 2020-10-13 ENCOUNTER — Ambulatory Visit: Payer: Medicare (Managed Care) | Admitting: Physical Therapy

## 2020-10-18 ENCOUNTER — Encounter: Payer: Self-pay | Admitting: *Deleted

## 2020-10-18 NOTE — Progress Notes (Signed)
Initiated Benefit verification via [place]  Waiting for response then will complete PA  Patient Name   Taimi, Piner  SR Submission Date  10/18/2020 Service Request # (SR #) [id number] Type BV Full Location  [facility] Prescriber  Wells Guiles Tat Status  In Progress Action  None

## 2020-11-04 ENCOUNTER — Telehealth: Payer: Self-pay

## 2020-11-04 NOTE — Telephone Encounter (Signed)
New message   ADDLYNN GAITAN Key: [id number] - PA Case ID: [id number] Need help? Call us at [phone]  Status Sent to Plan today  Drug Botox 100UNIT solution  Form [employer] Electronic PA Form [phone] NCPDP)

## 2020-11-08 NOTE — Telephone Encounter (Addendum)
F/u    Paperwork from [facility] by [facility]   Notice of approval of medicare prescription drug coverage determination  Botox for solution - type of coverage approved - non formulary   Coverage starting 10/22/2020 until further notice.    Reference # [id number]  This request has been approved.  Please note any additional information provided by [employer] Part D at the bottom of your screen.

## 2020-12-08 ENCOUNTER — Other Ambulatory Visit: Payer: Self-pay

## 2020-12-08 DIAGNOSIS — G245 Blepharospasm: Secondary | ICD-10-CM

## 2020-12-08 NOTE — Progress Notes (Signed)
Botox approved by insurance. Pharmacy needs a script for the botox.  Please Confirm ,DX we need to use for the botox. And dosing.  Will pend the order until advised.    Order added.   Directions for [place] Order added.

## 2020-12-09 MED ORDER — BOTOX 100 UNITS IJ SOLR: INTRAMUSCULAR | 4 refills | Status: DC

## 2020-12-13 ENCOUNTER — Encounter: Payer: Self-pay | Admitting: *Deleted

## 2020-12-13 ENCOUNTER — Other Ambulatory Visit (HOSPITAL_COMMUNITY): Payer: Self-pay

## 2020-12-13 NOTE — Progress Notes (Signed)
Test claim for Botox 100units went through with $0 co-pay. Vita Barley will send e-script to [facility] and we will try to get it here before her appointment November 4th.

## 2020-12-14 MED ORDER — BOTOX 100 UNITS IJ SOLR: INTRAMUSCULAR | 4 refills | Status: DC

## 2020-12-20 ENCOUNTER — Other Ambulatory Visit (HOSPITAL_COMMUNITY): Payer: Self-pay

## 2020-12-20 MED ORDER — BOTOX 100 UNITS IJ SOLR
INTRAMUSCULAR | 4 refills | Status: AC
  Filled 2020-12-20: qty 1, 1d supply, fill #0
  Filled 2021-03-15: qty 1, 90d supply, fill #1

## 2020-12-20 NOTE — Addendum Note (Signed)
Addended by: Venetia Night on: 12/20/2020 12:23 PM   Modules accepted: Orders

## 2020-12-21 ENCOUNTER — Other Ambulatory Visit (HOSPITAL_COMMUNITY): Payer: Self-pay

## 2020-12-24 ENCOUNTER — Ambulatory Visit (INDEPENDENT_AMBULATORY_CARE_PROVIDER_SITE_OTHER): Payer: Medicare (Managed Care) | Admitting: Neurology

## 2020-12-24 ENCOUNTER — Other Ambulatory Visit: Payer: Self-pay

## 2020-12-24 DIAGNOSIS — G245 Blepharospasm: Secondary | ICD-10-CM | POA: Diagnosis not present

## 2020-12-24 MED ORDER — ONABOTULINUMTOXINA 100 UNITS IJ SOLR
100.0000 [IU] | Freq: Once | INTRAMUSCULAR | Status: AC
  Administered 2020-12-24: 25 [IU] via INTRAMUSCULAR

## 2020-12-24 NOTE — Procedures (Signed)
[facility]   Procedure Note Botox  Attending: Dr. Wells Guiles Ahlana Slaydon  Preoperative Diagnosis(es): Blepharospasm    Consent obtained from: The patient and daughter Benefits discussed included, but were not limited to ability to open eyes and therefore see better.  Risk discussed included, but were not limited pain and discomfort, bleeding, bruising, excessive weakness, venous thrombosis, muscle atrophy and drooping of eyelids.  A copy of the patient medication guide was given to the patient which explains the blackbox warning.  Patients identity and treatment sites confirmed Yes.  .  Details of Procedure: Skin was cleaned with alcohol.  A 30 gauge, 1/2 inch needle was introduced to the target muscle.  Prior to injection, the needle plunger was aspirated to make sure the needle was not within a blood vessel.  There was no blood retrieved on aspiration.    Following is a summary of the muscles injected  And the amount of Botulinum toxin used:   Dilution 0.9% preservative free saline mixed with 100 u Botox type A to make 5 U per 0.1cc (2 cc of saline used per 100 U botox)  Injections  Location Left  Right Units  Upper lateral orbicularis oculi  2.5 2.5 5.0  Upper medial orbicularis oculi      Lateral orbicularis oculi  5.0 5.0 10.0  Lower lateral orbicularis oculi  2.5 2.5 5.0  Lower medial orbicularis oculi 2.5 2.5 5.0  Trapezius          TOTAL UNITS:   25   Agent: Botulinum Type A ( Onobotulinum Toxin type A ).  1 vials of Botox were used, each containing 50 units and freshly diluted with 2 mL of sterile, non-perserved saline   Total injected (Units):  25  Total wasted (Units):  75   Pt tolerated procedure well without complications.   Reinjection is anticipated in 3 months.

## 2020-12-31 ENCOUNTER — Other Ambulatory Visit (HOSPITAL_COMMUNITY): Payer: Self-pay

## 2021-01-06 ENCOUNTER — Other Ambulatory Visit (HOSPITAL_COMMUNITY): Payer: Self-pay

## 2021-02-18 ENCOUNTER — Observation Stay (HOSPITAL_COMMUNITY)
Admission: EM | Admit: 2021-02-18 | Discharge: 2021-02-19 | Disposition: A | Discharge status: Home Health | Payer: Medicare (Managed Care) | Attending: Internal Medicine | Admitting: Internal Medicine

## 2021-02-18 ENCOUNTER — Observation Stay (HOSPITAL_COMMUNITY): Payer: Medicare (Managed Care)

## 2021-02-18 ENCOUNTER — Other Ambulatory Visit: Payer: Self-pay

## 2021-02-18 ENCOUNTER — Encounter (HOSPITAL_COMMUNITY): Payer: Self-pay | Admitting: Emergency Medicine

## 2021-02-18 ENCOUNTER — Emergency Department (HOSPITAL_COMMUNITY): Payer: Medicare (Managed Care)

## 2021-02-18 ENCOUNTER — Observation Stay (HOSPITAL_COMMUNITY)
Admit: 2021-02-18 | Discharge: 2021-02-18 | Disposition: A | Discharge status: Home / Self Care | Payer: Medicare (Managed Care) | Attending: Student in an Organized Health Care Education/Training Program | Admitting: Student in an Organized Health Care Education/Training Program

## 2021-02-18 DIAGNOSIS — R4781 Slurred speech: Secondary | ICD-10-CM | POA: Diagnosis not present

## 2021-02-18 DIAGNOSIS — I160 Hypertensive urgency: Secondary | ICD-10-CM | POA: Diagnosis not present

## 2021-02-18 DIAGNOSIS — I10 Essential (primary) hypertension: Secondary | ICD-10-CM | POA: Diagnosis present

## 2021-02-18 DIAGNOSIS — K219 Gastro-esophageal reflux disease without esophagitis: Secondary | ICD-10-CM | POA: Diagnosis present

## 2021-02-18 DIAGNOSIS — R2990 Unspecified symptoms and signs involving the nervous system: Secondary | ICD-10-CM

## 2021-02-18 DIAGNOSIS — E785 Hyperlipidemia, unspecified: Secondary | ICD-10-CM | POA: Diagnosis present

## 2021-02-18 DIAGNOSIS — Z79899 Other long term (current) drug therapy: Secondary | ICD-10-CM | POA: Insufficient documentation

## 2021-02-18 DIAGNOSIS — R4701 Aphasia: Secondary | ICD-10-CM | POA: Diagnosis present

## 2021-02-18 DIAGNOSIS — R479 Unspecified speech disturbances: Secondary | ICD-10-CM | POA: Diagnosis present

## 2021-02-18 DIAGNOSIS — C9000 Multiple myeloma not having achieved remission: Secondary | ICD-10-CM | POA: Diagnosis present

## 2021-02-18 DIAGNOSIS — E559 Vitamin D deficiency, unspecified: Secondary | ICD-10-CM | POA: Diagnosis present

## 2021-02-18 DIAGNOSIS — G20 Parkinson's disease: Secondary | ICD-10-CM | POA: Insufficient documentation

## 2021-02-18 DIAGNOSIS — G20C Parkinsonism, unspecified: Secondary | ICD-10-CM | POA: Diagnosis present

## 2021-02-18 DIAGNOSIS — Z20822 Contact with and (suspected) exposure to covid-19: Secondary | ICD-10-CM | POA: Insufficient documentation

## 2021-02-18 DIAGNOSIS — G928 Other toxic encephalopathy: Secondary | ICD-10-CM | POA: Diagnosis not present

## 2021-02-18 LAB — I-STAT CHEM 8, ED
BUN: 20 mg/dL (ref 8–23)
Calcium, Ion: 1.2 mmol/L (ref 1.15–1.40)
Chloride: 106 mmol/L (ref 98–111)
Creatinine, Ser: 0.9 mg/dL (ref 0.44–1.00)
Glucose, Bld: 113 mg/dL — ABNORMAL HIGH (ref 70–99)
HCT: 35 % — ABNORMAL LOW (ref 36.0–46.0)
Hemoglobin: 11.9 g/dL — ABNORMAL LOW (ref 12.0–15.0)
Potassium: 4.4 mmol/L (ref 3.5–5.1)
Sodium: 141 mmol/L (ref 135–145)
TCO2: 26 mmol/L (ref 22–32)

## 2021-02-18 LAB — PROTIME-INR
INR: 1 (ref 0.8–1.2)
Prothrombin Time: 13.4 seconds (ref 11.4–15.2)

## 2021-02-18 LAB — CBC
HCT: 35.3 % — ABNORMAL LOW (ref 36.0–46.0)
Hemoglobin: 11.2 g/dL — ABNORMAL LOW (ref 12.0–15.0)
MCH: 30.4 pg (ref 26.0–34.0)
MCHC: 31.7 g/dL (ref 30.0–36.0)
MCV: 95.7 fL (ref 80.0–100.0)
Platelets: 171 10*3/uL (ref 150–400)
RBC: 3.69 MIL/uL — ABNORMAL LOW (ref 3.87–5.11)
RDW: 13.9 % (ref 11.5–15.5)
WBC: 3.6 10*3/uL — ABNORMAL LOW (ref 4.0–10.5)
nRBC: 0 % (ref 0.0–0.2)

## 2021-02-18 LAB — DIFFERENTIAL
Abs Immature Granulocytes: 0.01 10*3/uL (ref 0.00–0.07)
Basophils Absolute: 0 10*3/uL (ref 0.0–0.1)
Basophils Relative: 0 %
Eosinophils Absolute: 0.4 10*3/uL (ref 0.0–0.5)
Eosinophils Relative: 11 %
Immature Granulocytes: 0 %
Lymphocytes Relative: 27 %
Lymphs Abs: 1 10*3/uL (ref 0.7–4.0)
Monocytes Absolute: 0.3 10*3/uL (ref 0.1–1.0)
Monocytes Relative: 8 %
Neutro Abs: 1.9 10*3/uL (ref 1.7–7.7)
Neutrophils Relative %: 54 %

## 2021-02-18 LAB — URINALYSIS, ROUTINE W REFLEX MICROSCOPIC
Bacteria, UA: NONE SEEN
Bilirubin Urine: NEGATIVE
Glucose, UA: NEGATIVE mg/dL
Hgb urine dipstick: NEGATIVE
Ketones, ur: NEGATIVE mg/dL
Nitrite: NEGATIVE
Protein, ur: NEGATIVE mg/dL
Specific Gravity, Urine: 1.024 (ref 1.005–1.030)
pH: 5 (ref 5.0–8.0)

## 2021-02-18 LAB — COMPREHENSIVE METABOLIC PANEL
ALT: 15 U/L (ref 0–44)
AST: 23 U/L (ref 15–41)
Albumin: 3.5 g/dL (ref 3.5–5.0)
Alkaline Phosphatase: 52 U/L (ref 38–126)
Anion gap: 6 (ref 5–15)
BUN: 19 mg/dL (ref 8–23)
CO2: 25 mmol/L (ref 22–32)
Calcium: 8.5 mg/dL — ABNORMAL LOW (ref 8.9–10.3)
Chloride: 107 mmol/L (ref 98–111)
Creatinine, Ser: 0.77 mg/dL (ref 0.44–1.00)
GFR, Estimated: >60 mL/min (ref >60)
Glucose, Bld: 116 mg/dL — ABNORMAL HIGH (ref 70–99)
Potassium: 4.4 mmol/L (ref 3.5–5.1)
Sodium: 138 mmol/L (ref 135–145)
Total Bilirubin: 0.7 mg/dL (ref 0.3–1.2)
Total Protein: 7.8 g/dL (ref 6.5–8.1)

## 2021-02-18 LAB — ETHANOL: Alcohol, Ethyl (B): <10 mg/dL (ref <10)

## 2021-02-18 LAB — RAPID URINE DRUG SCREEN, HOSP PERFORMED
Amphetamines: NOT DETECTED
Barbiturates: NOT DETECTED
Benzodiazepines: NOT DETECTED
Cocaine: NOT DETECTED
Opiates: NOT DETECTED
Tetrahydrocannabinol: NOT DETECTED

## 2021-02-18 LAB — RESP PANEL BY RT-PCR (FLU A&B, COVID) ARPGX2
Influenza A by PCR: NEGATIVE
Influenza B by PCR: NEGATIVE
SARS Coronavirus 2 by RT PCR: NEGATIVE

## 2021-02-18 LAB — CBG MONITORING, ED: Glucose-Capillary: 112 mg/dL — ABNORMAL HIGH (ref 70–99)

## 2021-02-18 LAB — APTT: aPTT: 20 seconds — ABNORMAL LOW (ref 24–36)

## 2021-02-18 MED ORDER — SENNOSIDES-DOCUSATE SODIUM 8.6-50 MG PO TABS: 1.0000 | ORAL_TABLET | Freq: Every evening | ORAL | Status: DC | PRN

## 2021-02-18 MED ORDER — SIMVASTATIN 20 MG PO TABS
20.0000 mg | ORAL_TABLET | Freq: Every day | ORAL | Status: DC
  Administered 2021-02-18: 19:00:00 20 mg via ORAL
  Filled 2021-02-18: qty 1

## 2021-02-18 MED ORDER — NEBIVOLOL HCL 5 MG PO TABS
5.0000 mg | ORAL_TABLET | Freq: Every day | ORAL | Status: DC
  Administered 2021-02-18 – 2021-02-19 (×2): 5 mg via ORAL
  Filled 2021-02-18 (×2): qty 1

## 2021-02-18 MED ORDER — ENOXAPARIN SODIUM 40 MG/0.4ML IJ SOSY
40.0000 mg | PREFILLED_SYRINGE | INTRAMUSCULAR | Status: DC
  Administered 2021-02-19: 40 mg via SUBCUTANEOUS
  Filled 2021-02-18: qty 0.4

## 2021-02-18 MED ORDER — ACETAMINOPHEN 160 MG/5ML PO SOLN: 650.0000 mg | ORAL | Status: DC | PRN

## 2021-02-18 MED ORDER — CARBIDOPA-LEVODOPA 25-100 MG PO TABS
1.0000 | ORAL_TABLET | Freq: Every day | ORAL | Status: DC
  Administered 2021-02-19: 1 via ORAL
  Filled 2021-02-18: qty 1

## 2021-02-18 MED ORDER — STROKE: EARLY STAGES OF RECOVERY BOOK
Freq: Once | Status: AC
  Filled 2021-02-18: qty 1

## 2021-02-18 MED ORDER — ACETAMINOPHEN 650 MG RE SUPP: 650.0000 mg | RECTAL | Status: DC | PRN

## 2021-02-18 MED ORDER — NEBIVOLOL HCL 5 MG PO TABS: 5.0000 mg | ORAL_TABLET | Freq: Every day | ORAL | Status: DC

## 2021-02-18 MED ORDER — ACETAMINOPHEN 325 MG PO TABS: 650.0000 mg | ORAL_TABLET | ORAL | Status: DC | PRN

## 2021-02-18 MED ORDER — VITAMIN D 25 MCG (1000 UNIT) PO TABS
5000.0000 [IU] | ORAL_TABLET | Freq: Every day | ORAL | Status: DC
  Administered 2021-02-19: 5000 [IU] via ORAL
  Filled 2021-02-18 (×2): qty 5

## 2021-02-18 MED ORDER — GABAPENTIN 100 MG PO CAPS
100.0000 mg | ORAL_CAPSULE | Freq: Three times a day (TID) | ORAL | Status: DC
  Administered 2021-02-19 (×2): 100 mg via ORAL
  Filled 2021-02-18 (×3): qty 1

## 2021-02-18 MED ORDER — SODIUM CHLORIDE 0.9 % IV SOLN: INTRAVENOUS | Status: DC

## 2021-02-18 MED ORDER — CARBIDOPA-LEVODOPA 25-100 MG PO TABS
2.0000 | ORAL_TABLET | Freq: Every day | ORAL | Status: DC
  Administered 2021-02-19: 2 via ORAL
  Filled 2021-02-18 (×2): qty 2

## 2021-02-18 MED ORDER — AMLODIPINE BESYLATE 5 MG PO TABS: 10.0000 mg | ORAL_TABLET | Freq: Every day | ORAL | Status: DC

## 2021-02-18 MED ORDER — AMLODIPINE BESYLATE 10 MG PO TABS
10.0000 mg | ORAL_TABLET | Freq: Every day | ORAL | Status: DC
  Administered 2021-02-18 – 2021-02-19 (×2): 10 mg via ORAL
  Filled 2021-02-18: qty 1
  Filled 2021-02-18: qty 2

## 2021-02-18 NOTE — Consult Note (Addendum)
[facility] Telemedicine Consult   Requesting Provider: Dr. Francia Greaves at [facility] long emergency room Consult Participants: Dr. Jerelyn Charles, Telespecialist RN-Deanna   bedside RN Dellia Beckwith Location of the provider: [facility] Location of the patient: [facility]- Room [facility]B  This consult was provided via telemedicine with 2-way video and audio communication. The patient/family was informed that care would be provided in this way and agreed to receive care in this manner.    Chief Complaint: Speech changes  HPI: 80 year old with past medical history of Parkinson's disease with dysautonomia, neurogenic orthostatic hypotension with syncope, chronic pain, neutropenia, multiple myeloma, hypertension, hyperlipidemia, prior patient of [facility] neurology-then moved to [place] and now back to [place] and a patient of Dr. Carles Collet at [facility] neurology, brought in by the daughter for concerns for speech changes. Last known well is somewhat difficult to ascertain due to the reasons below. The daughter reports that the patient has been feeling unwell for the past couple of days, with some speech problems-not as bad as what happened around 12:30 PM today, her blood pressures have been running on the higher side because of running out of her medications.  Her blood pressures at started to normalize at home with highest readings being in the 190s over the past few days. Daughter said she is taking her Sinemet but all the other medications were left back in [place] and has had difficult time getting those medications.  The patient was having some trouble with her speech but at around 12:30 PM today there was a significant difference in the quality of speech that the patient had-she had a lot of stuttering with her speech which made the daughter bring the patient to the emergency room.  Due to the sudden onset of neurological deficits, code stroke was activated and I evaluated the patient on camera via telemedicine.   Patient unable to provide any reliable history Daughter, who is the primary caregiver was at the bedside and provided history.  Patient's last neurological outpatient visit was September 14, 2020 with Dr. 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  On arrival to the ER her highest blood pressure reading was 185/78.  Past Medical History:  Diagnosis Date   Allergy    Anemia    Anxiety    Benign neoplasm of unspecified site    Cataract    Chest pain    Colon polyps    Depression    h/o depression   Depression with anxiety    Esophageal reflux    Hyperlipidemia    Hypertension    Osteoarthrosis, unspecified whether generalized or localized, unspecified site    Ulcer    Vitamin D deficiency     No current facility-administered medications for this encounter.  Current Outpatient Medications:    amLODipine (NORVASC) 10 MG tablet, Take 10 mg by mouth daily., Disp: , Rfl:    botulinum toxin Type A (BOTOX) 100 units SOLR injection, Inject 100 units IM into the eyelids once every 90 days, Disp: 1 each, Rfl: 4   carbidopa-levodopa (SINEMET IR) 25-100 MG tablet, Take 2 tablets before breakfast, then 1 tablet before lunch, Disp: 270 tablet, Rfl: 1   cholecalciferol (VITAMIN D3) 25 MCG (1000 UNIT) tablet, Take 5,000 Units by mouth daily., Disp: , Rfl:    Ferrous Sulfate Dried (SLOW IRON PO), Take 1 tablet by mouth daily., Disp: , Rfl:    gabapentin (NEURONTIN) 100 MG capsule, Take 1 capsule (  100 mg total) by mouth 3 (three) times daily., Disp: 90 capsule, Rfl: 5   nebivolol (BYSTOLIC) 5 MG tablet, Take 1 tablet (5 mg total) by mouth daily., Disp: 90 tablet, Rfl: 2   simvastatin (ZOCOR) 20 MG tablet, Take 1 tablet (20 mg total) by mouth daily., Disp: 14 tablet, Rfl: 0   traMADol (ULTRAM) 50 MG tablet, Take 1 tablet (50 mg total) by mouth every 12 (twelve) hours as needed., Disp: 60 tablet, Rfl: 3    LKW: Unclear-some speech changes at  1230 today but has been feeling unwell for the past 2 days with some speech changes tpa given?: No, unclear last known well IR Thrombectomy? No, poor MR S Modified Rankin Scale: 4-Needs assistance to walk and tend to bodily needs Time of teleneurologist evaluation: 1317 h  Exam: Vitals:   02/18/21 1305 02/18/21 1330  BP: (!) 185/78 (!) 183/78  Pulse: 88 82  Resp: 18 18  Temp: 98.2 F (36.8 C)   SpO2: 100% 99%    General: Awake alert looks a little concerned on the camera HEENT: Appears normocephalic and atraumatic CVS: Regular rate rhythm on the monitor Respiration: Breathing well and saturating well as noted by the O2 saturation on the monitor Neurological exam She is awake alert oriented to self.  She is not able to tell me where she is on the camera.  She had a hard time answering questions to begin with and was extremely bradykinetic. Her speech had a somewhat stuttering quality to it but she was able to name simple objects.  She was unable to tell me the month.  She was able to tell me her age likely in the 41s but her speech was not very comprehensible. Cranial nerves: Pupils appear equal round react light, extraocular movements unhindered, there was right eyelid blepharospasm off and on noted, face appears symmetric, tongue and palate midline. Motor examination with antigravity in all 4 extremities Sensation intact to touch Coordination: No gross dysmetria.   NIHSS 1A: Level of Consciousness - 0 1B: Ask Month and Age -35 1C: 'Blink Eyes' & 'Squeeze Hands' -0 2: Test Horizontal Extraocular Movements -0 3: Test Visual Fields -0 4: Test Facial Palsy -0 5A: Test Left Arm Motor Drift -0 5B: Test Right Arm Motor Drift -0 6A: Test Left Leg Motor Drift -0 6B: Test Right Leg Motor Drift -0 7: Test Limb Ataxia -0 8: Test Sensation -0 9: Test Language/Aphasia-1 10: Test Dysarthria -1 11: Test Extinction/Inattention -0 NIHSS score: 3   Imaging Reviewed: CT head: Aspects 10,  no bleed.  No evidence of large evolving ischemic infarction..  Labs reviewed in epic and pertinent values follow: CBC    Component Value Date/Time   WBC 3.6 (L) 02/18/2021 1308   RBC 3.69 (L) 02/18/2021 1308   HGB 11.9 (L) 02/18/2021 1319   HGB 11.0 (L) 11/26/2014 1339   HCT 35.0 (L) 02/18/2021 1319   HCT 33.8 (L) 11/26/2014 1339   PLT 171 02/18/2021 1308   PLT 198 11/26/2014 1339   MCV 95.7 02/18/2021 1308   MCV 95.8 11/26/2014 1339   MCH 30.4 02/18/2021 1308   MCHC 31.7 02/18/2021 1308   RDW 13.9 02/18/2021 1308   RDW 14.4 11/26/2014 1339   LYMPHSABS 1.0 02/18/2021 1308   LYMPHSABS 1.1 11/26/2014 1339   MONOABS 0.3 02/18/2021 1308   MONOABS 0.2 11/26/2014 1339   EOSABS 0.4 02/18/2021 1308   EOSABS 0.1 11/26/2014 1339   BASOSABS 0.0 02/18/2021 1308   BASOSABS 0.0 11/26/2014  1339   CMP     Component Value Date/Time   NA 141 02/18/2021 1319   NA 139 11/26/2014 1340   K 4.4 02/18/2021 1319   K 4.3 11/26/2014 1340   CL 106 02/18/2021 1319   CL 106 10/20/2011 1457   CO2 25 02/18/2021 1308   CO2 27 11/26/2014 1340   GLUCOSE 113 (H) 02/18/2021 1319   GLUCOSE 99 11/26/2014 1340   GLUCOSE 127 (H) 10/20/2011 1457   BUN 20 02/18/2021 1319   BUN 14.4 11/26/2014 1340   CREATININE 0.90 02/18/2021 1319   CREATININE 0.9 11/26/2014 1340   CALCIUM 8.5 (L) 02/18/2021 1308   CALCIUM 8.8 11/26/2014 1340   PROT 7.8 02/18/2021 1308   PROT 8.3 11/26/2014 1340   ALBUMIN 3.5 02/18/2021 1308   ALBUMIN 3.4 (L) 11/26/2014 1340   AST 23 02/18/2021 1308   AST 18 11/26/2014 1340   ALT 15 02/18/2021 1308   ALT 16 11/26/2014 1340   ALKPHOS 52 02/18/2021 1308   ALKPHOS 66 11/26/2014 1340   BILITOT 0.7 02/18/2021 1308   BILITOT 0.51 11/26/2014 1340   GFRNONAA >60 02/18/2021 1308   GFRNONAA 70 10/09/2011 1457   GFRAA 54 (L) 10/14/2017 1002   GFRAA 80 10/09/2011 1457     Assessment: 80 year old with above past medical history brought in for evaluation of sudden onset in the change of  her speech.  Has been feeling unwell for the past few days due to running high on her systolic blood pressures due to not having access to her blood pressure medications. This afternoon, the speech became more stuttering and quality making the family concerned and driving her to the emergency room for further evaluation. On my examination, she does have some stuttering of speech and attention concentration deficits but due to the unclear last known well and nonfocal exam essentially, not a candidate for IV thrombolysis.  Her baseline functional status is not the greatest and her modified Rankin score precludes her from consideration of thrombectomy.  I am concerned that her current symptoms might be secondary to toxic metabolic encephalopathy versus hypertensive encephalopathy-in the setting of underlying poor brain reserve and I would recommend work-up for such. She has been evaluated in the past for syncopal episodes with EEGs etc. and I would also recommend getting an EEG to make sure we are not ruling out seizures which can also be commonly seen in patients with neurodegenerative processes.  Impression -Speech disturbance secondary to toxic metabolic encephalopathy versus Hypertensive urgency -Evaluate for seizures -Evaluate for stroke -History of Parkinson's disease with dysautonomia  Recommendations:  -Reasonable to keep for Obs -Frequent neurochecks -EEG-technologist notified and will do it at [facility] long hospital -After the EEG is completed, MRI brain without contrast -There is no need for permissive hypertension unless the MRI shows a stroke.  In the presence of a negative MRI, her blood pressures should be reduced as you would for hypertensive urgency with no more than 20% a day for goal discharge blood pressure of normotension. -Urinalysis -Chest x-ray -Ammonia, B12, TSH -Her Parkinson medicines should be continued as per home dosing regimen -She needs follow-up outpatient with her  neurologist as scheduled  Discussed with EDP-Dr. Francia Greaves  I have also discussed this plan with the: Neuro hospitalist team-Dr. Theda Sers over secure chat.  This patient is receiving care for possible acute neurological changes. There was 55 minutes of care by this provider at the time of service, including time for direct evaluation via telemedicine, review of medical records,  imaging studies and discussion of findings with providers, the patient and/or family.  -- Amie Portland, MD [facility] Pager: [phone] If 7pm to 7am, please call on call as listed on AMION.

## 2021-02-18 NOTE — ED Notes (Signed)
EEG tech at bedside. 

## 2021-02-18 NOTE — ED Provider Notes (Signed)
[facility] DEPT Provider Note   CSN: [id number] Arrival date & time: 02/18/21  1302     History Chief Complaint  Patient presents with   stroke like symtpoms   Aphasia    [facility] is a 80 y.o. female.  80 year old female with prior medical history as detailed below presents for evaluation.  Patient is accompanied by her daughter.  Patient was last known normal at 12:30 PM.  Patient with acute onset per the daughter's report of slurred speech.  Patient also with mild left facial droop for daughter's report.  Patient with history of hypertension and parkinsonism.  She is visiting family here from [place].  She admits that she has been out of some of her medications for the last week.  It appears that she may still have her Sinemet which she has been taking.  Other medications were left behind.  The daughter at bedside provides majority of history.    The history is provided by the patient, medical records and a relative.  Neurologic Problem This is a new problem. The current episode started less than 1 hour ago. The problem occurs rarely. The problem has not changed since onset.Nothing aggravates the symptoms. Nothing relieves the symptoms.      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   Spinal stenosis in cervical region 09/24/2014   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    Dysphagia, pharyngoesophageal phase 08/06/2014   PAC (premature atrial contraction) 06/09/2014   Tremor 05/21/2014   Nonspecific abnormal electrocardiogram (ECG) (EKG) 04/09/2014   Routine general medical examination at a health care facility 04/09/2014   Other screening mammogram 09/17/2013   Constipation 06/11/2013   OAB (overactive bladder) 06/11/2013   GERD (gastroesophageal reflux disease) 03/21/2012   MGUS (monoclonal gammopathy of unknown significance) 11/24/2011   Health care maintenance 09/12/2011   Vitamin D deficiency 08/10/2008   Osteoarthritis 03/09/2006   Hyperlipidemia with target LDL less than 130 02/06/2006   Essential hypertension 02/06/2006    Past Surgical History:  Procedure Laterality Date   ABDOMINAL HYSTERECTOMY  1995   breast reduction     cataracts     both eyes   CESAREAN SECTION     x 4   tummy tuck       OB History   No obstetric history on file.     Family History  Problem Relation Age of Onset   Ovarian cancer Mother    Diabetes Father    Parkinson's disease Father    Parkinson's disease Brother    Diabetes Brother        x 2   Prostate cancer Brother    Diabetes Maternal Uncle    Diabetes Maternal Grandmother    Diabetes Other    Colon cancer Neg Hx  Esophageal cancer Neg Hx   ° Rectal cancer Neg Hx   ° Stomach cancer Neg Hx   ° Neurologic Disorder Neg Hx   ° ° °Social History  ° °Tobacco Use  ° Smoking status: Never  ° Smokeless tobacco: Never  ° Tobacco comments:  °  been around 2nd hand smoke  °Vaping Use  ° Vaping Use: Never used  °Substance Use Topics  ° Alcohol use: No  ° Drug use: No  ° ° °Home Medications °Prior to Admission medications   °Medication Sig Start Date End Date Taking? Authorizing Provider  °amLODipine (NORVASC) 10 MG tablet Take 10 mg by mouth daily.    [provider]  °botulinum toxin Type A (BOTOX) 100 units SOLR injection Inject 100 units IM into the eyelids once every 90 days 12/20/20   Tat, Rebecca S,  DO  °carbidopa-levodopa (SINEMET IR) 25-100 MG tablet Take 2 tablets before breakfast, then 1 tablet before lunch 01/01/15   Aquino, Karen M, MD  °cholecalciferol (VITAMIN D3) 25 MCG (1000 UNIT) tablet Take 5,000 Units by mouth daily.    [provider]  °Ferrous Sulfate Dried (SLOW IRON PO) Take 1 tablet by mouth daily.    [provider]  °gabapentin (NEURONTIN) 100 MG capsule Take 1 capsule (100 mg total) by mouth 3 (three) times daily. 08/13/20   John, James W, MD  °nebivolol (BYSTOLIC) 5 MG tablet Take 1 tablet (5 mg total) by mouth daily. 05/13/15   Jones, Thomas L, MD  °simvastatin (ZOCOR) 20 MG tablet Take 1 tablet (20 mg total) by mouth daily. 08/06/14   Jones, Thomas L, MD  °traMADol (ULTRAM) 50 MG tablet Take 1 tablet (50 mg total) by mouth every 12 (twelve) hours as needed. 09/20/20   Jones, Thomas L, MD  ° ° °Allergies    °Citrus, Ibuprofen, and Other ° °Review of Systems   °Review of Systems  °All other systems reviewed and are negative. ° °Physical Exam °Updated Vital Signs °BP (!) 164/67    Pulse 66    Temp 98.2 °F (36.8 °C) (Oral)    Resp 14    Ht 5' 4" (1.626 m)    Wt 72.6 kg    LMP 07/24/1993    SpO2 100%    BMI 27.48 kg/m²  ° °Physical Exam °Vitals and nursing note reviewed.  °Constitutional:   °   General: She is not in acute distress. °   Appearance: Normal appearance. She is well-developed.  °HENT:  °   Head: Normocephalic and atraumatic.  °Eyes:  °   Conjunctiva/sclera: Conjunctivae normal.  °   Pupils: Pupils are equal, round, and reactive to light.  °Cardiovascular:  °   Rate and Rhythm: Normal rate and regular rhythm.  °   Heart sounds: Normal heart sounds.  °Pulmonary:  °   Effort: Pulmonary effort is normal. No respiratory distress.  °   Breath sounds: Normal breath sounds.  °Abdominal:  °   General: There is no distension.  °   Palpations: Abdomen is soft.  °   Tenderness: There is no abdominal tenderness.  °Musculoskeletal:     °   General: No deformity. Normal range of  motion.  °   Cervical back: Normal range of motion and neck supple.  °Skin: °   General: Skin is warm and dry.  °Neurological:  °   Mental Status: She is alert.  °   Comments: Alert ° °Oriented to self ° °Speech is stuttering and mildly   slurred ° °No appreciable facial droop ° °Movements of BUE and BLE are slow, but moves against gravity in all 4 extremities  ° ° °ED Results / Procedures / Treatments   °Labs °(all labs ordered are listed, but only abnormal results are displayed) °Labs Reviewed  °APTT - Abnormal; Notable for the following components:  °    Result Value  ° aPTT 20 (*)   ° All other components within normal limits  °CBC - Abnormal; Notable for the following components:  ° WBC 3.6 (*)   ° RBC 3.69 (*)   ° Hemoglobin 11.2 (*)   ° HCT 35.3 (*)   ° All other components within normal limits  °COMPREHENSIVE METABOLIC PANEL - Abnormal; Notable for the following components:  ° Glucose, Bld 116 (*)   ° Calcium 8.5 (*)   ° All other components within normal limits  °URINALYSIS, ROUTINE W REFLEX MICROSCOPIC - Abnormal; Notable for the following components:  ° Leukocytes,Ua MODERATE (*)   ° All other components within normal limits  °CBG MONITORING, ED - Abnormal; Notable for the following components:  ° Glucose-Capillary 112 (*)   ° All other components within normal limits  °I-STAT CHEM 8, ED - Abnormal; Notable for the following components:  ° Glucose, Bld 113 (*)   ° Hemoglobin 11.9 (*)   ° HCT 35.0 (*)   ° All other components within normal limits  °RESP PANEL BY RT-PCR (FLU A&B, COVID) ARPGX2  °ETHANOL  °PROTIME-INR  °DIFFERENTIAL  °RAPID URINE DRUG SCREEN, HOSP PERFORMED  °TSH  °AMMONIA  °VITAMIN B12  ° ° °EKG °None ° °Radiology °CT HEAD CODE STROKE WO CONTRAST ° °Result Date: 02/18/2021 °CLINICAL DATA:  Code stroke.  Right facial droop, slurred speech EXAM: CT HEAD WITHOUT CONTRAST TECHNIQUE: Contiguous axial images were obtained from the base of the skull through the vertex without intravenous contrast.  COMPARISON:  08/31/2013. FINDINGS: Brain: No evidence of acute infarction, hemorrhage, cerebral edema, mass, mass effect, or midline shift. Ventricles and sulci are normal for age. No extra-axial fluid collection. Redemonstrated bilateral basal ganglia calcifications. Periventricular white matter changes, likely the sequela of chronic small vessel ischemic disease. Vascular: No hyperdense vessel. Atherosclerotic calcifications in the intracranial carotid and vertebral arteries. Skull: Normal. Negative for fracture or focal lesion. Sinuses/Orbits: No acute finding. Status post bilateral lens replacements. Other: The mastoid air cells are well aerated. ASPECTS ([place]ta Stroke Program Early CT Score) - Ganglionic level infarction (caudate, lentiform nuclei, internal capsule, insula, M1-M3 cortex): 7 - Supraganglionic infarction (M4-M6 cortex): 3 Total score (0-10 with 10 being normal): 10 IMPRESSION: 1. No acute intracranial process. 2. ASPECTS is 10 Code stroke imaging results were communicated on 02/18/2021 at 1:30 pm to provider  via telephone, who verbally acknowledged these results. Electronically Signed   By: Alison  Vasan M.D.   On: 02/18/2021 13:31   ° °Procedures °Procedures  ° °Medications Ordered in ED °Medications - No data to display ° °ED Course  °I have reviewed the triage vital signs and the nursing notes. ° °Pertinent labs & imaging results that were available during my care of the patient were reviewed by me and considered in my medical decision making (see chart for details). ° °  °MDM Rules/Calculators/A&P °                        °MDM ° °MSE complete ° °Cathern Mikkelson was evaluated in Emergency Department on 02/18/2021 for the symptoms described in the history of   present illness. She was evaluated in the context of the global COVID-19 pandemic, which necessitated consideration that the patient might be at risk for infection with the SARS-CoV-2 virus that causes COVID-19. Institutional  protocols and algorithms that pertain to the evaluation of patients at risk for COVID-19 are in a state of rapid change based on information released by regulatory bodies including the CDC and federal and state organizations. These policies and algorithms were followed during the patient's care in the ED.  Patient present with acute onset per report of slurred and stuttering speech.  Last known normal was at 12:30 PM today.  Code stroke initiated upon arrival patient to the ED.  Patient was felt to not be a candidate for tPA or other aggressive neuro intervention.   Neurology recommends continued inpatient observation, EEG, and MRI.  Patient may remain at [facility] if EEG/MRI without acute findings.  Patient's case discussed with hospitalist service who will evaluate for admission.    CRITICAL CARE Performed by: Valarie Merino   Total critical care time: 45 minutes  Critical care time was exclusive of separately billable procedures and treating other patients.  Critical care was necessary to treat or prevent imminent or life-threatening deterioration.  Critical care was time spent personally by me on the following activities: development of treatment plan with patient and/or surrogate as well as nursing, discussions with consultants, evaluation of patient's response to treatment, examination of patient, obtaining history from patient or surrogate, ordering and performing treatments and interventions, ordering and review of laboratory studies, ordering and review of radiographic studies, pulse oximetry and re-evaluation of patient's condition.    Final Clinical Impression(s) / ED Diagnoses Final diagnoses:  Speech disturbance, unspecified type    Rx / DC Orders ED Discharge Orders     None        Valarie Merino, MD 02/18/21 [phone]

## 2021-02-18 NOTE — ED Notes (Signed)
Attempted to call EEG tech, unsuccessful at this time.

## 2021-02-18 NOTE — ED Notes (Signed)
Unable to complete [facility] and swallow screen due to EEG in progress.

## 2021-02-18 NOTE — ED Notes (Signed)
BP 200/74. X, Blount informed via epic secure chat.

## 2021-02-18 NOTE — H&P (Signed)
History and Physical    Brianna Pittman CNO:[id number] DOB: 11/22/1940 DOA: 02/18/2021  PCP: Janith Lima, MD   Patient coming from: Home Chief Complaint  Patient presents with   stroke like symtpoms   Aphasia   HPI: Brianna Pittman is a 80 y.o. 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  Patient is visiting her family from [place] past week or so and has not been taking any of her medication except for Sinemet and daughter having trouble getting those medication here.  Apparently last known normal 12:30 PM but unsure given patient is a poor historian, patient noted to have slurred speech and some facial droop so brought to the ED  ED Course: Blood pressure in 160s to 190s, labs stable renal function and hemoglobin, UA without evidence of UTI influenza COVID-19 negative, UDS negative.  CT head as part of code stroke had no acute finding.  Teleneurology was consulted recommended to do EEG and then MRI brain after EEG, along with routine lab with TSH and ammonia level lipid panel, B12.  Observation was requested for overnight When he arrived in the ED patient is alert awake able to tell me her name but the speech is muffled slurred, reports her daughter went out to get something to eat.  She is able to follow simple, and instruction able to move all her extremities.  Assessment/Plan  Stroke-like symptom-slurred speech questionable left facial droop: Appreciate teleneurology input patient will be monitored overnight follow-up EEG after which MRI brain.  If no acute stroke no further work-up, presentation could be in the setting of uncontrolled hypertension and related to her Parkinson's disease.  Vitamin D deficiency history: Resume  her home vitamin D  HlD: Follow-up fasting lipid profile resume statin  Essential hypertension: Blood pressure accelerated/hypertension urgency.  Not been taking her nebivolol and amlodipine we will resume as soon as MRI is done if no acute stroke no need to follow permissive hypertension and will work on lowering the blood pressure slowly  Parkinsonian disease with dysautonomia  Neurogenic orthostatic hypotension with syncope: She is followed by neurology.  Resume her Sinemet, Neurontin  Mild leukopenia history of multiple myeloma follow-up outpatient CBC Multiple myeloma: Advised to follow-up with her PCP.  Overweight with Body mass index is 27.48 kg/m.   Severity of Illness: The appropriate patient status for this patient is OBSERVATION. Observation status is judged to be reasonable and necessary in order to provide the required intensity of service to ensure the patient's safety. The patient's presenting symptoms, physical exam findings, and initial radiographic and laboratory data in the context of their medical condition is felt to place them at decreased risk for further clinical deterioration. Furthermore, it is anticipated that the patient will be medically stable for discharge from the hospital within 2 midnights of admission.    DVT prophylaxis: enoxaparin (LOVENOX) injection 40 mg Start: 02/19/21 0700  Code Status:   Code Status: Full Code  Family Communication: Admission, patients condition and plan of care including tests being ordered have been discussed with the patient and daughter on the phone who indicate understanding and agree with the plan and Code Status.  Consults called:Teleneurology. Review of Systems:Unable to review all system given patient mental status, but denies any pain chest pain fever chills   Past Medical  History:  Diagnosis Date   Allergy    Anemia    Anxiety    Benign neoplasm of unspecified site    Cataract    Chest pain    Colon polyps     Depression    h/o depression   Depression with anxiety    Esophageal reflux    Hyperlipidemia    Hypertension    Osteoarthrosis, unspecified whether generalized or localized, unspecified site    Ulcer    Vitamin D deficiency     Past Surgical History:  Procedure Laterality Date   ABDOMINAL HYSTERECTOMY  1995   breast reduction     cataracts     both eyes   CESAREAN SECTION     x 4   tummy tuck       reports that she has never smoked. She has never used smokeless tobacco. She reports that she does not drink alcohol and does not use drugs.  Allergies  Allergen Reactions   Citrus Other (See Comments)    Upset stomach   Ibuprofen     REACTION: itching   Other Other (See Comments)    Coffee:severe acid reflux    Family History  Problem Relation Age of Onset   Ovarian cancer Mother    Diabetes Father    Parkinson's disease Father    Parkinson's disease Brother    Diabetes Brother        x 2   Prostate cancer Brother    Diabetes Maternal Uncle    Diabetes Maternal Grandmother    Diabetes Other    Colon cancer Neg Hx    Esophageal cancer Neg Hx    Rectal cancer Neg Hx    Stomach cancer Neg Hx    Neurologic Disorder Neg Hx      Prior to Admission medications   Medication Sig Start Date End Date Taking? Authorizing Provider  amLODipine (NORVASC) 10 MG tablet Take 10 mg by mouth daily.   Yes [provider]  carbidopa-levodopa (SINEMET IR) 25-100 MG tablet Take 2 tablets before breakfast, then 1 tablet before lunch 01/01/15  Yes Cameron Sprang, MD  cholecalciferol (VITAMIN D3) 25 MCG (1000 UNIT) tablet Take 5,000 Units by mouth daily.   Yes [provider]  Ferrous Sulfate Dried (SLOW IRON PO) Take 1 tablet by mouth daily.   Yes [provider]  gabapentin (NEURONTIN) 100 MG capsule Take 1 capsule (100 mg total) by mouth 3 (three) times daily. 08/13/20  Yes Biagio Borg, MD  nebivolol (BYSTOLIC) 5 MG tablet Take 1 tablet (5 mg total) by  mouth daily. 05/13/15  Yes Janith Lima, MD  simvastatin (ZOCOR) 20 MG tablet Take 1 tablet (20 mg total) by mouth daily. 08/06/14  Yes Janith Lima, MD  botulinum toxin Type A (BOTOX) 100 units SOLR injection Inject 100 units IM into the eyelids once every 90 days 12/20/20   Tat, Eustace Quail, DO  traMADol (ULTRAM) 50 MG tablet Take 1 tablet (50 mg total) by mouth every 12 (twelve) hours as needed. Patient not taking: Reported on 02/18/2021 09/20/20   Janith Lima, MD    Physical Exam: Vitals:   02/18/21 1515 02/18/21 1530 02/18/21 1545 02/18/21 1600  BP: (!) 163/66 (!) 164/67 (!) 173/74 (!) 181/76  Pulse: 65 66 65 70  Resp: _0 Temp:      TempSrc:      SpO2: 100% 100% 100% 100%  Weight:  Height:       General exam: Alert awake, conversant with slurred speech, older than stated age HEENT:Oral mucosa moist, Ear/Nose WNL grossly, dentition normal. Respiratory system: bilaterally clear,no wheezing or crackles,no use of accessory muscle Cardiovascular system: S1 & S2 +, No JVD,. Gastrointestinal system: Abdomen soft, NT,ND, BS+ Nervous System:Alert, awake, moving extremities and grossly nonfocal Extremities: No edema, distal peripheral pulses palpable.  Skin: No rashes,no icterus. MSK: Normal muscle bulk,tone, power   Labs on Admission: I have personally reviewed following labs and imaging studies CBC: Recent Labs  Lab 02/18/21 1308 02/18/21 1319  WBC 3.6*  --   NEUTROABS 1.9  --   HGB 11.2* 11.9*  HCT 35.3* 35.0*  MCV 95.7  --   PLT 171  --    Basic Metabolic Panel: Recent Labs  Lab 02/18/21 1308 02/18/21 1319  NA 138 141  K 4.4 4.4  CL 107 106  CO2 25  --   GLUCOSE 116* 113*  BUN 19 20  CREATININE 0.77 0.90  CALCIUM 8.5*  --    GFR: Estimated Creatinine Clearance: 49.5 mL/min (by C-G formula based on SCr of 0.9 mg/dL). Liver Function Tests: Recent Labs  Lab 02/18/21 1308  AST 23  ALT 15  ALKPHOS 52  BILITOT 0.7  PROT 7.8  ALBUMIN 3.5    No results for input(s): LIPASE, AMYLASE in the last 168 hours. No results for input(s): AMMONIA in the last 168 hours. Coagulation Profile: Recent Labs  Lab 02/18/21 1308  INR 1.0   Cardiac Enzymes: No results for input(s): CKTOTAL, CKMB, CKMBINDEX, TROPONINI in the last 168 hours. BNP (last 3 results) No results for input(s): PROBNP in the last 8760 hours. HbA1C: No results for input(s): HGBA1C in the last 72 hours. CBG: Recent Labs  Lab 02/18/21 1311  GLUCAP 112*   Lipid Profile: No results for input(s): CHOL, HDL, LDLCALC, TRIG, CHOLHDL, LDLDIRECT in the last 72 hours. Thyroid Function Tests: No results for input(s): TSH, T4TOTAL, FREET4, T3FREE, THYROIDAB in the last 72 hours. Anemia Panel: No results for input(s): VITAMINB12, FOLATE, FERRITIN, TIBC, IRON, RETICCTPCT in the last 72 hours. Urine analysis:    Component Value Date/Time   COLORURINE YELLOW 02/18/2021 [place] 02/18/2021 1431   LABSPEC 1.024 02/18/2021 1431   PHURINE 5.0 02/18/2021 1431   GLUCOSEU NEGATIVE 02/18/2021 1431   GLUCOSEU NEGATIVE 04/09/2014 1104   HGBUR NEGATIVE 02/18/2021 1431   [place] 02/18/2021 1431   [place] 02/18/2021 1431   PROTEINUR NEGATIVE 02/18/2021 1431   UROBILINOGEN 0.2 04/09/2014 1104   NITRITE NEGATIVE 02/18/2021 1431   LEUKOCYTESUR MODERATE (A) 02/18/2021 1431    Radiological Exams on Admission: CT HEAD CODE STROKE WO CONTRAST  Result Date: 02/18/2021 CLINICAL DATA:  Code stroke.  Right facial droop, slurred speech EXAM: CT HEAD WITHOUT CONTRAST TECHNIQUE: Contiguous axial images were obtained from the base of the skull through the vertex without intravenous contrast. COMPARISON:  08/31/2013. FINDINGS: Brain: No evidence of acute infarction, hemorrhage, cerebral edema, mass, mass effect, or midline shift. Ventricles and sulci are normal for age. No extra-axial fluid collection. Redemonstrated bilateral basal ganglia  calcifications. Periventricular white matter changes, likely the sequela of chronic small vessel ischemic disease. Vascular: No hyperdense vessel. Atherosclerotic calcifications in the intracranial carotid and vertebral arteries. Skull: Normal. Negative for fracture or focal lesion. Sinuses/Orbits: No acute finding. Status post bilateral lens replacements. Other: The mastoid air cells are well aerated. ASPECTS [facility] Stroke Program Early CT Score) - Ganglionic level  infarction (caudate, lentiform nuclei, internal capsule, insula, M1-M3 cortex): 7 - Supraganglionic infarction (M4-M6 cortex): 3 Total score (0-10 with 10 being normal): 10 IMPRESSION: 1. No acute intracranial process. 2. ASPECTS is 10 Code stroke imaging results were communicated on 02/18/2021 at 1:30 pm to provider Messick via telephone, who verbally acknowledged these results. Electronically Signed   By: Merilyn Baba M.D.   On: 02/18/2021 13:31    Antonieta Pert MD [facility]  If 7PM-7AM, please contact night-coverage [email or web address]  02/18/2021, 4:39 PM

## 2021-02-18 NOTE — Progress Notes (Signed)
EEG complete - results pending 

## 2021-02-18 NOTE — ED Triage Notes (Signed)
BIB family who reports about 12:30  pt had slurred speech, pt has had elevated BP over the past 3 days, has not taken BP meds for about 1 week. R sided facial deficits noted.

## 2021-02-18 NOTE — ED Notes (Signed)
Pt transported to MRI 

## 2021-02-19 DIAGNOSIS — R479 Unspecified speech disturbances: Secondary | ICD-10-CM | POA: Diagnosis not present

## 2021-02-19 DIAGNOSIS — R2990 Unspecified symptoms and signs involving the nervous system: Secondary | ICD-10-CM | POA: Diagnosis not present

## 2021-02-19 LAB — CBC
HCT: 29.9 % — ABNORMAL LOW (ref 36.0–46.0)
Hemoglobin: 9.8 g/dL — ABNORMAL LOW (ref 12.0–15.0)
MCH: 31.5 pg (ref 26.0–34.0)
MCHC: 32.8 g/dL (ref 30.0–36.0)
MCV: 96.1 fL (ref 80.0–100.0)
Platelets: 146 10*3/uL — ABNORMAL LOW (ref 150–400)
RBC: 3.11 MIL/uL — ABNORMAL LOW (ref 3.87–5.11)
RDW: 14 % (ref 11.5–15.5)
WBC: 3.1 10*3/uL — ABNORMAL LOW (ref 4.0–10.5)
nRBC: 0 % (ref 0.0–0.2)

## 2021-02-19 LAB — LIPID PANEL
Cholesterol: 232 mg/dL — ABNORMAL HIGH (ref 0–200)
HDL: 41 mg/dL (ref >40)
LDL Cholesterol: 179 mg/dL — ABNORMAL HIGH (ref 0–99)
Total CHOL/HDL Ratio: 5.7 RATIO
Triglycerides: 61 mg/dL (ref <150)
VLDL: 12 mg/dL (ref 0–40)

## 2021-02-19 LAB — TSH: TSH: 2.025 u[IU]/mL (ref 0.350–4.500)

## 2021-02-19 LAB — AMMONIA: Ammonia: 32 umol/L (ref 9–35)

## 2021-02-19 LAB — VITAMIN B12: Vitamin B-12: 283 pg/mL (ref 180–914)

## 2021-02-19 LAB — HEMOGLOBIN A1C
Hgb A1c MFr Bld: 5.8 % — ABNORMAL HIGH (ref 4.8–5.6)
Mean Plasma Glucose: 119.76 mg/dL

## 2021-02-19 MED ORDER — VITAMIN B-12 1000 MCG PO TABS
500.0000 ug | ORAL_TABLET | Freq: Every day | ORAL | Status: DC
  Administered 2021-02-19: 500 ug via ORAL
  Filled 2021-02-19: qty 1

## 2021-02-19 MED ORDER — HYDRALAZINE HCL 25 MG PO TABS: 25.0000 mg | ORAL_TABLET | Freq: Four times a day (QID) | ORAL | Status: DC | PRN

## 2021-02-19 MED ORDER — ATORVASTATIN CALCIUM 40 MG PO TABS: 40.0000 mg | ORAL_TABLET | Freq: Every day | ORAL | 0 refills | Status: DC

## 2021-02-19 MED ORDER — CYANOCOBALAMIN 500 MCG PO TABS: 500.0000 ug | ORAL_TABLET | Freq: Every day | ORAL | 0 refills | Status: AC

## 2021-02-19 MED ORDER — AMLODIPINE BESYLATE 10 MG PO TABS: 10.0000 mg | ORAL_TABLET | Freq: Every day | ORAL | 0 refills | Status: DC

## 2021-02-19 MED ORDER — NEBIVOLOL HCL 5 MG PO TABS: 5.0000 mg | ORAL_TABLET | Freq: Every day | ORAL | 0 refills | Status: DC

## 2021-02-19 MED ORDER — ATORVASTATIN CALCIUM 40 MG PO TABS: 80.0000 mg | ORAL_TABLET | Freq: Every day | ORAL | Status: DC

## 2021-02-19 NOTE — Evaluation (Signed)
Physical Therapy Evaluation Patient Details Name: Brianna Pittman MRN: [id number] DOB: [birth year] Today's Date: 02/19/2021  History of Present Illness  Brianna Pittman is a 80 y.o. 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 Imaging negative for acute stroke.  Clinical Impression  Pt admitted with above diagnosis.  Pt currently with functional limitations due to the deficits listed below (see PT Problem List).Pt has d/c orders at this time. Recommend HHPT.  If d/c falls through, please re-order PT.        Recommendations for follow up therapy are one component of a multi-disciplinary discharge planning process, led by the attending physician.  Recommendations may be updated based on patient status, additional functional criteria and insurance authorization.  Follow Up Recommendations Home health PT    Assistance Recommended at Discharge Frequent or constant Supervision/Assistance  Functional Status Assessment Patient has had a recent decline in their functional status and demonstrates the ability to make significant improvements in function in a reasonable and predictable amount of time.  Equipment Recommendations  None recommended by PT    Recommendations for Other Services       Precautions / Restrictions Precautions Precautions: Fall Restrictions Weight Bearing Restrictions: No      Mobility  Bed Mobility Overal bed mobility: Needs Assistance Bed Mobility: Supine to Sit     Supine to sit: Min assist;Min guard     General bed mobility comments: Initiated transfer and needed MIN A to get hips turned and scooted to edge of bed    Transfers Overall transfer level: Needs assistance Equipment used: Rolling walker (2  wheels) Transfers: Sit to/from Stand Sit to Stand: Min assist;Min guard           General transfer comment: Multiple sit to stands and A level fluctuated.  Improved with each sit to stand    Ambulation/Gait Ambulation/Gait assistance: Min assist;Min guard Gait Distance (Feet): 15 Feet (x2) Assistive device: Rolling walker (2 wheels) Gait Pattern/deviations: Decreased step length - right;Decreased step length - left;Trunk flexed       General Gait Details: Cues for step length  Stairs            Wheelchair Mobility    Modified Rankin (Stroke Patients Only)       Balance Overall balance assessment: Needs assistance Sitting-balance support: No upper extremity supported;Feet supported Sitting balance-Leahy Scale: Fair     Standing balance support: During functional activity;Reliant on assistive device for balance Standing balance-Leahy Scale: Poor                               Pertinent Vitals/Pain Pain Assessment: No/denies pain    Home Living Family/patient expects to be discharged to:: Private residence Living Arrangements: Children Available Help at Discharge: Family;Available 24 hours/day Type of Home: House           Home Equipment: Conservation officer, nature (2 wheels);Cane - quad;Wheelchair - manual;Hospital bed      Prior Function Prior Level of Function : Needs assist       Physical Assist : Mobility (physical) Mobility (physical): Gait   Mobility Comments: Daughter is "sometimes" with her when she walks ADLs Comments: daughter assists with all ADLs     Hand Dominance        Extremity/Trunk  Assessment   Upper Extremity Assessment Upper Extremity Assessment: Defer to OT evaluation RUE Deficits / Details: WFL ROM RUE Coordination: decreased fine motor;decreased gross motor (tremor) LUE Deficits / Details: WFL ROM LUE Coordination: decreased gross motor;decreased fine motor (tremor)    Lower Extremity Assessment Lower  Extremity Assessment: Generalized weakness    Cervical / Trunk Assessment Cervical / Trunk Assessment: Kyphotic  Communication   Communication: Expressive difficulties (very soft voice - hard to understand)  Cognition Arousal/Alertness: Awake/alert Behavior During Therapy: WFL for tasks assessed/performed Overall Cognitive Status: Within Functional Limits for tasks assessed                                          General Comments      Exercises     Assessment/Plan    PT Assessment All further PT needs can be met in the next venue of care  PT Problem List Decreased mobility;Decreased activity tolerance;Decreased balance       PT Treatment Interventions      PT Goals (Current goals can be found in the Care Plan section)  Acute Rehab PT Goals Patient Stated Goal: pt agreeable to work with PT PT Goal Formulation: All assessment and education complete, DC therapy    Frequency     Barriers to discharge        Co-evaluation               AM-PAC PT "6 Clicks" Mobility  Outcome Measure Help needed turning from your back to your side while in a flat bed without using bedrails?: A Little Help needed moving from lying on your back to sitting on the side of a flat bed without using bedrails?: A Little Help needed moving to and from a bed to a chair (including a wheelchair)?: A Little Help needed standing up from a chair using your arms (e.g., wheelchair or bedside chair)?: A Little Help needed to walk in hospital room?: A Little Help needed climbing 3-5 steps with a railing? : A Lot 6 Click Score: 17    End of Session Equipment Utilized During Treatment: Gait belt Activity Tolerance: Patient tolerated treatment well Patient left: in chair;with call bell/phone within reach;with chair alarm set Nurse Communication: Mobility status PT Visit Diagnosis: Other abnormalities of gait and mobility (R26.89)    Time: 3790-2409 PT Time Calculation (min)  (ACUTE ONLY): 32 min   Charges:   PT Evaluation $PT Eval Moderate Complexity: 1 Mod          Brianna Pittman L. Brianna Pittman, [place]  02/19/2021   Brianna Pittman 02/19/2021, 12:47 PM

## 2021-02-19 NOTE — Procedures (Signed)
Patient Name: Brianna Pittman  MRN: [id number]  Epilepsy Attending: Lora Havens  Referring Physician/Provider: Dr Amie [place] Date: 02/18/2021 Duration: 23.09 mins  Patient history:  80 year old brought in for evaluation of sudden onset in the change of her speech. EEG to evaluate for seizure.   Level of alertness: Awake  AEDs during EEG study: GBP  Technical aspects: This EEG study was done with scalp electrodes positioned according to the 10-20 International system of electrode placement. Electrical activity was acquired at a sampling rate of 500Hz  and reviewed with a high frequency filter of 70Hz  and a low frequency filter of 1Hz . EEG data were recorded continuously and digitally stored.   Description: The posterior dominant rhythm consists of 8 Hz activity of moderate voltage (25-35 uV) seen predominantly in posterior head regions, symmetric and reactive to eye opening and eye closing. EEG showed intermittent generalized and maximal left temporal region 3 to 6 Hz theta-delta slowing. Hyperventilation and photic stimulation were not performed.     ABNORMALITY - Intermittent slow, generalized and maximal left temporal region  IMPRESSION: This study is suggestive of nonspecific cortical dysfunction arising from left temporal region. Additionally there is mild diffuse encephalopathy, nonspecific etiology. No seizures or epileptiform discharges were seen throughout the recording.  [name]

## 2021-02-19 NOTE — Discharge Summary (Signed)
Physician Discharge Summary  Brianna Pittman ENI:[id number] DOB: [birth year] DOA: 02/18/2021  PCP: Janith Lima, MD  Admit date: 02/18/2021 Discharge date: 02/19/2021  Admitted From: home Disposition:  home  Recommendations for Outpatient Follow-up:  Follow up with PCP in 1-2 weeks Please obtain BMP/CBC in one week Please follow up on the following pending results: bp DAILY  Home Health:YES  Equipment/Devices: NONE  Discharge Condition: Stable Code Status:   Code Status: Full Code Diet recommendation:  Diet Order             Diet Heart Room service appropriate? Yes; Fluid consistency: Thin  Diet effective now                    Brief/Interim Summary:  80 y.o. 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  Patient is visiting her family from [place] past week or so and has not been taking any of her medication except for Sinemet and daughter having trouble getting those medication here.  Apparently last known normal 12:30 PM but unsure given patient is a poor historian, patient noted to have slurred speech and some facial droop so brought to the ED   ED Course: Blood pressure in 160s to 190s, labs stable renal function and hemoglobin, UA without evidence of UTI influenza COVID-19 negative, UDS negative.  CT head as part of code stroke had no acute finding.  Teleneurology was consulted recommended to do EEG and then MRI brain after EEG, along with routine lab with TSH and ammonia level lipid panel, B12.  Observation was requested for overnight When he arrived in the ED patient is alert awake able to tell me her name but the speech is muffled slurred, reports her daughter went out to get something to eat.  She is able to  follow simple, and instruction able to move all her extremities Patient was admitted underwent EEG nonspecific finding no evidence of seizure, MRI brain did not show acute stroke but microvascular and age-related atrophy changes. Blood pressure has improved after resuming home medication and her speech is much clearer.  Lipid panel with LDL uncontrolled at 179, ammonia normal B12 borderline normal. She is medically stable.  Discharge Diagnoses:   Stroke-like symptom-slurred speech questionable left facial droop: Suspect in the setting of hypertension urgency, no evidence of seizure and acute stroke completed MRI brain and EEG.  Seen by telemetry neurology.  B12 borderline will supplement, antihypertensives resumed   Hypertension urgency blood pressure has improved resume home meds prescription will be sent she was not able to take any of her medications as she did not bring them while visiting from  [place] to [place]   Vitamin D deficiency history: Cont her home vitamin D   HlD: Uncontrolled LDL 179 change statin to Lipitor 80     Parkinsonian disease with dysautonomia  Neurogenic orthostatic hypotension with syncope: She is followed by neurology.  cont her Sinemet, Neurontin   Mild leukopenia history of multiple myeloma follow-up outpatient CBC Multiple myeloma: Advised to follow-up with her PCP.   Overweight with Body mass index is 27.48 kg/m  Consults: Neurology  Subjective: Alert awake oriented speech clear  Discharge Exam: Vitals:   02/19/21 0811 02/19/21 1207  BP: (!) 149/61 (!) 121/52  Pulse: 66 (!) 58  Resp: 18 18  Temp: 97.9 F (36.6 C) 98.3 F (36.8 C)  SpO2: 100% 100%   General: Pt is alert, awake, not in acute distress Cardiovascular: RRR, S1/S2 +, no rubs, no gallops Respiratory: CTA bilaterally, no wheezing, no rhonchi Abdominal: Soft, NT, ND, bowel sounds + Extremities: no edema, no cyanosis  Discharge Instructions  Discharge Instructions     Discharge  instructions   Complete by: As directed    Please call call MD or return to ER for similar or worsening recurring problem that brought you to hospital or if any fever,nausea/vomiting,abdominal pain, uncontrolled pain, chest pain,  shortness of breath or any other alarming symptoms.  Please follow-up your doctor as instructed in a week time and call the office for appointment.  Monitor BP daily at home and follow up with pcp  Please avoid alcohol, smoking, or any other illicit substance and maintain healthy habits including taking your regular medications as prescribed.  You were cared for by a hospitalist during your hospital stay. If you have any questions about your discharge medications or the care you received while you were in the hospital after you are discharged, you can call the unit and ask to speak with the hospitalist on call if the hospitalist that took care of you is not available.  Once you are discharged, your primary care physician will handle any further medical issues. Please note that NO REFILLS for any discharge medications will be authorized once you are discharged, as it is imperative that you return to your primary care physician (or establish a relationship with a primary care physician if you do not have one) for your aftercare needs so that they can reassess your need for medications and monitor your lab values   Increase activity slowly   Complete by: As directed       Allergies as of 02/19/2021       Reactions   Citrus Other (See Comments)   Upset stomach   Ibuprofen    REACTION: itching   Other Other (See Comments)   Coffee:severe acid reflux        Medication List     STOP taking these medications    simvastatin 20 MG tablet Commonly known as: ZOCOR       TAKE these medications    amLODipine 10 MG tablet Commonly known as: NORVASC Take 1 tablet (10 mg total) by mouth daily.   atorvastatin 40 MG tablet Commonly known as: LIPITOR Take 1  tablet (40 mg total) by mouth at bedtime.   Botox 100 units Solr injection Generic drug: botulinum toxin Type A Inject 100 units IM into the eyelids once every 90 days   carbidopa-levodopa 25-100 MG tablet Commonly known as: SINEMET IR Take 2 tablets before breakfast, then 1 tablet before lunch   cholecalciferol 25 MCG (1000 UNIT) tablet Commonly known as: VITAMIN D3 Take 5,000 Units by mouth daily.   gabapentin 100 MG capsule Commonly known as: NEURONTIN Take 1 capsule (100 mg total) by mouth 3 (three) times daily.   nebivolol 5 MG tablet Commonly known as: BYSTOLIC Take 1 tablet (5 mg total) by mouth daily.   SLOW IRON PO Take 1 tablet by mouth daily.   traMADol 50 MG tablet Commonly known as: ULTRAM Take 1 tablet (50 mg total) by mouth every 12 (twelve) hours as needed.   vitamin B-12 500 MCG tablet Commonly known as: CYANOCOBALAMIN Take 1 tablet (500 mcg total) by mouth daily. Start taking on: February 20, 2021  Follow-up Information     Janith Lima, MD Follow up in 1 week(s).   Specialty: Internal Medicine Contact information: [place] [address] [phone]                Allergies  Allergen Reactions   Citrus Other (See Comments)    Upset stomach   Ibuprofen     REACTION: itching   Other Other (See Comments)    Coffee:severe acid reflux    The results of significant diagnostics from this hospitalization (including imaging, microbiology, ancillary and laboratory) are listed below for reference.    Microbiology: Recent Results (from the past 240 hour(s))  Resp Panel by RT-PCR (Flu A&B, Covid) Nasopharyngeal Swab     Status: None   Collection Time: 02/18/21  1:12 PM   Specimen: Nasopharyngeal Swab; Nasopharyngeal(NP) swabs in vial transport medium  Result Value Ref Range Status   SARS Coronavirus 2 by RT PCR NEGATIVE NEGATIVE Final    Comment: (NOTE) SARS-CoV-2 target nucleic acids are NOT DETECTED.  The  SARS-CoV-2 RNA is generally detectable in upper respiratory specimens during the acute phase of infection. The lowest concentration of SARS-CoV-2 viral copies this assay can detect is 138 copies/mL. A negative result does not preclude SARS-Cov-2 infection and should not be used as the sole basis for treatment or other patient management decisions. A negative result may occur with  improper specimen collection/handling, submission of specimen other than nasopharyngeal swab, presence of viral mutation(s) within the areas targeted by this assay, and inadequate number of viral copies(<138 copies/mL). A negative result must be combined with clinical observations, patient history, and epidemiological information. The expected result is Negative.  Fact Sheet for Patients:  EntrepreneurPulse.com.au  Fact Sheet for Healthcare Providers:  IncredibleEmployment.be  This test is no t yet approved or cleared by the [place] FDA and  has been authorized for detection and/or diagnosis of SARS-CoV-2 by FDA under an Emergency Use Authorization (EUA). This EUA will remain  in effect (meaning this test can be used) for the duration of the COVID-19 declaration under Section 564(b)(1) of the Act, 21 U.S.C.section 360bbb-3(b)(1), unless the authorization is terminated  or revoked sooner.       Influenza A by PCR NEGATIVE NEGATIVE Final   Influenza B by PCR NEGATIVE NEGATIVE Final    Comment: (NOTE) The Xpert Xpress SARS-CoV-2/FLU/RSV plus assay is intended as an aid in the diagnosis of influenza from Nasopharyngeal swab specimens and should not be used as a sole basis for treatment. Nasal washings and aspirates are unacceptable for Xpert Xpress SARS-CoV-2/FLU/RSV testing.  Fact Sheet for Patients: EntrepreneurPulse.com.au  Fact Sheet for Healthcare Providers: IncredibleEmployment.be  This test is not yet approved or  cleared by the [place] FDA and has been authorized for detection and/or diagnosis of SARS-CoV-2 by FDA under an Emergency Use Authorization (EUA). This EUA will remain in effect (meaning this test can be used) for the duration of the COVID-19 declaration under Section 564(b)(1) of the Act, 21 U.S.C. section 360bbb-3(b)(1), unless the authorization is terminated or revoked.  Performed at [facility], [place] [address], [place], [place] [address]     Procedures/Studies: MR BRAIN WO CONTRAST  Result Date: 02/18/2021 CLINICAL DATA:  Neuro deficit, acute, stroke suspected EXAM: MRI HEAD WITHOUT CONTRAST TECHNIQUE: Multiplanar, multiecho pulse sequences of the brain and surrounding structures were obtained without intravenous contrast. COMPARISON:  Same day CT head.  MRI August 18, 2008. FINDINGS: Brain: No evidence of acute infarct,  acute hemorrhage, mass lesion, midline shift, extra-axial fluid collection, or hydrocephalus. Moderate patchy and confluent T2/FLAIR hyperintensity within the white matter, nonspecific but compatible with chronic microvascular ischemic disease. Moderate atrophy with ex vacuo ventricular dilation. Vascular: Major arterial flow voids are maintained at the skull base. Skull and upper cervical spine: Normal marrow signal. Sinuses/Orbits: Clear sinuses.  Unremarkable orbits. Other: No substantial mastoid effusions. IMPRESSION: 1. No evidence of acute intracranial abnormality. 2. Moderate chronic microvascular ischemic. 3. Moderate cerebral atrophy (ICD10-G31.9). Electronically Signed   By: Margaretha Sheffield M.D.   On: 02/18/2021 18:49   EEG adult  Result Date: 02/19/2021 Lora Havens, MD     02/19/2021  8:55 AM Patient Name: Brianna Pittman MRN: [id number] Epilepsy Attending: Lora Havens Referring Physician/Provider: Dr Amie [place] Date: 02/18/2021 Duration: 23.09 mins Patient history:  81 year old brought in for evaluation of sudden onset in the  change of her speech. EEG to evaluate for seizure. Level of alertness: Awake AEDs during EEG study: GBP Technical aspects: This EEG study was done with scalp electrodes positioned according to the 10-20 International system of electrode placement. Electrical activity was acquired at a sampling rate of '500Hz'  and reviewed with a high frequency filter of '70Hz'  and a low frequency filter of '1Hz' . EEG data were recorded continuously and digitally stored. Description: The posterior dominant rhythm consists of 8 Hz activity of moderate voltage (25-35 uV) seen predominantly in posterior head regions, symmetric and reactive to eye opening and eye closing. EEG showed intermittent generalized and maximal left temporal region 3 to 6 Hz theta-delta slowing. Hyperventilation and photic stimulation were not performed.   ABNORMALITY - Intermittent slow, generalized and maximal left temporal region IMPRESSION: This study is suggestive of nonspecific cortical dysfunction arising from left temporal region. Additionally there is mild diffuse encephalopathy, nonspecific etiology. No seizures or epileptiform discharges were seen throughout the recording. Lora Havens   CT HEAD CODE STROKE WO CONTRAST  Result Date: 02/18/2021 CLINICAL DATA:  Code stroke.  Right facial droop, slurred speech EXAM: CT HEAD WITHOUT CONTRAST TECHNIQUE: Contiguous axial images were obtained from the base of the skull through the vertex without intravenous contrast. COMPARISON:  08/31/2013. FINDINGS: Brain: No evidence of acute infarction, hemorrhage, cerebral edema, mass, mass effect, or midline shift. Ventricles and sulci are normal for age. No extra-axial fluid collection. Redemonstrated bilateral basal ganglia calcifications. Periventricular white matter changes, likely the sequela of chronic small vessel ischemic disease. Vascular: No hyperdense vessel. Atherosclerotic calcifications in the intracranial carotid and vertebral arteries. Skull: Normal.  Negative for fracture or focal lesion. Sinuses/Orbits: No acute finding. Status post bilateral lens replacements. Other: The mastoid air cells are well aerated. ASPECTS [facility] Stroke Program Early CT Score) - Ganglionic level infarction (caudate, lentiform nuclei, internal capsule, insula, M1-M3 cortex): 7 - Supraganglionic infarction (M4-M6 cortex): 3 Total score (0-10 with 10 being normal): 10 IMPRESSION: 1. No acute intracranial process. 2. ASPECTS is 10 Code stroke imaging results were communicated on 02/18/2021 at 1:30 pm to provider Messick via telephone, who verbally acknowledged these results. Electronically Signed   By: Merilyn Baba M.D.   On: 02/18/2021 13:31    Labs: BNP (last 3 results) No results for input(s): BNP in the last 8760 hours. Basic Metabolic Panel: Recent Labs  Lab 02/18/21 1308 02/18/21 1319  NA 138 141  K 4.4 4.4  CL 107 106  CO2 25  --   GLUCOSE 116* 113*  BUN 19 20  CREATININE 0.77 0.90  CALCIUM 8.5*  --  Liver Function Tests: Recent Labs  Lab 02/18/21 1308  AST 23  ALT 15  ALKPHOS 52  BILITOT 0.7  PROT 7.8  ALBUMIN 3.5   No results for input(s): LIPASE, AMYLASE in the last 168 hours. Recent Labs  Lab 02/19/21 0147  AMMONIA 32   CBC: Recent Labs  Lab 02/18/21 1308 02/18/21 1319 02/19/21 0420  WBC 3.6*  --  3.1*  NEUTROABS 1.9  --   --   HGB 11.2* 11.9* 9.8*  HCT 35.3* 35.0* 29.9*  MCV 95.7  --  96.1  PLT 171  --  146*   Cardiac Enzymes: No results for input(s): CKTOTAL, CKMB, CKMBINDEX, TROPONINI in the last 168 hours. BNP: Invalid input(s): POCBNP CBG: Recent Labs  Lab 02/18/21 1311  GLUCAP 112*   D-Dimer No results for input(s): DDIMER in the last 72 hours. Hgb A1c Recent Labs    02/19/21 0420  HGBA1C 5.8*   Lipid Profile Recent Labs    02/19/21 0420  CHOL 232*  HDL 41  LDLCALC 179*  TRIG 61  CHOLHDL 5.7   Thyroid function studies Recent Labs    02/18/21 1630  TSH 2.025   Anemia work up Recent Labs     02/18/21 1630  VITAMINB12 283   Urinalysis    Component Value Date/Time   COLORURINE YELLOW 02/18/2021 [place] 02/18/2021 1431   LABSPEC 1.024 02/18/2021 1431   PHURINE 5.0 02/18/2021 1431   [place] 02/18/2021 1431   [place] 04/09/2014 1104   [place] 02/18/2021 1431   [place] 02/18/2021 1431   [place] 02/18/2021 1431   PROTEINUR NEGATIVE 02/18/2021 1431   UROBILINOGEN 0.2 04/09/2014 1104   NITRITE NEGATIVE 02/18/2021 1431   LEUKOCYTESUR MODERATE (A) 02/18/2021 1431   Sepsis Labs Invalid input(s): PROCALCITONIN,  WBC,  LACTICIDVEN Microbiology Recent Results (from the past 240 hour(s))  Resp Panel by RT-PCR (Flu A&B, Covid) Nasopharyngeal Swab     Status: None   Collection Time: 02/18/21  1:12 PM   Specimen: Nasopharyngeal Swab; Nasopharyngeal(NP) swabs in vial transport medium  Result Value Ref Range Status   SARS Coronavirus 2 by RT PCR NEGATIVE NEGATIVE Final    Comment: (NOTE) SARS-CoV-2 target nucleic acids are NOT DETECTED.  The SARS-CoV-2 RNA is generally detectable in upper respiratory specimens during the acute phase of infection. The lowest concentration of SARS-CoV-2 viral copies this assay can detect is 138 copies/mL. A negative result does not preclude SARS-Cov-2 infection and should not be used as the sole basis for treatment or other patient management decisions. A negative result may occur with  improper specimen collection/handling, submission of specimen other than nasopharyngeal swab, presence of viral mutation(s) within the areas targeted by this assay, and inadequate number of viral copies(<138 copies/mL). A negative result must be combined with clinical observations, patient history, and epidemiological information. The expected result is Negative.  Fact Sheet for Patients:  EntrepreneurPulse.com.au  Fact Sheet for Healthcare Providers:   IncredibleEmployment.be  This test is no t yet approved or cleared by the [place] FDA and  has been authorized for detection and/or diagnosis of SARS-CoV-2 by FDA under an Emergency Use Authorization (EUA). This EUA will remain  in effect (meaning this test can be used) for the duration of the COVID-19 declaration under Section 564(b)(1) of the Act, 21 U.S.C.section 360bbb-3(b)(1), unless the authorization is terminated  or revoked sooner.       Influenza A by PCR NEGATIVE NEGATIVE Final   Influenza B  by PCR NEGATIVE NEGATIVE Final    Comment: (NOTE) The Xpert Xpress SARS-CoV-2/FLU/RSV plus assay is intended as an aid in the diagnosis of influenza from Nasopharyngeal swab specimens and should not be used as a sole basis for treatment. Nasal washings and aspirates are unacceptable for Xpert Xpress SARS-CoV-2/FLU/RSV testing.  Fact Sheet for Patients: EntrepreneurPulse.com.au  Fact Sheet for Healthcare Providers: IncredibleEmployment.be  This test is not yet approved or cleared by the [place] FDA and has been authorized for detection and/or diagnosis of SARS-CoV-2 by FDA under an Emergency Use Authorization (EUA). This EUA will remain in effect (meaning this test can be used) for the duration of the COVID-19 declaration under Section 564(b)(1) of the Act, 21 U.S.C. section 360bbb-3(b)(1), unless the authorization is terminated or revoked.  Performed at [facility], [place] [address] [place], [place] [address]      Time coordinating discharge: 25 minutes  SIGNED: Antonieta Pert, MD  [facility] 02/19/2021, 12:20 PM  If 7PM-7AM, please contact night-coverage [email or web address]

## 2021-02-19 NOTE — Progress Notes (Signed)
Pt alert and oriented x4, able to follow commands, no c/o pain, no s/s of respiratory distress and resting comfortably in bed, will continue plan of care.  @0530  Called pt daughter Rosann Auerbach and informed her that her mother is here in [place] 1429 and also pt daughter answered some of the admission questions.

## 2021-02-19 NOTE — TOC Progression Note (Addendum)
Transition of Care [facility]) - Progression Note    Patient Details  Name: Brianna Pittman MRN: [id number] Date of Birth: [birth year]  Transition of Care [facility]) CM/SW Contact  Leeroy Cha, RN Phone Number: 02/19/2021, 1:08 PM  Clinical Narrative:    [facility] unable to hhc due to staffing. Sent to [facility] for review.-amy-running auth through insurance. Sent to [facility] for review-tct [place] Dy with [facility] .unable to handle oon. Sent to [facility] for review-will handle for therapies. This is an out of state medicare managed in [place].  Expected Discharge Plan: [place] Barriers to Discharge: No Barriers Identified  Expected Discharge Plan and Services Expected Discharge Plan: [place]   Discharge Planning Services: CM Consult Post Acute Care Choice: [place] arrangements for the past 2 months: Single Family Home Expected Discharge Date: 02/19/21                         HH Arranged: PT, OT HH Agency: [place] Date [facility] Agency Contacted: 02/19/21 Time HH Agency Contacted: 1308 Representative spoke with at [place]: amy   Social Determinants of Health ([place]) Interventions    Readmission Risk Interventions No flowsheet data found.

## 2021-02-19 NOTE — Evaluation (Signed)
Occupational Therapy Evaluation Patient Details Name: Brianna Pittman MRN: [id number] DOB: Jul 18, 1940 Today's Date: 02/19/2021   History of Present Illness Brianna Pittman is a 80 y.o. 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 Imaging negative for acute stroke.   Clinical Impression   Brianna Pittman is a 80 year old woman who presents with generalized weakness, decreased activity tolerance, bradykinesia and impaired balance. Patient typically lives at home with her daughter who helps her with ADLs and some movement. Patient reports feeling weaker today and required max assist to transfer to side of bed, min assist to stand and min guard to walk with walker. Her ambulation limited to bathroom and back and standing at sink. Patient able to wash face and hands at sink and hold on to walker during toileting/clothing management. Patient will benefit from skilled OT services while in hospital to improve deficits and learn compensatory strategies as needed in order to return to PLOF.  Recommend [place] services at [facility].        Recommendations for follow up therapy are one component of a multi-disciplinary discharge planning process, led by the attending physician.  Recommendations may be updated based on patient status, additional functional criteria and insurance authorization.   Follow Up Recommendations  Home health OT    Assistance Recommended at Discharge Frequent or constant Supervision/Assistance  Functional Status Assessment  Patient has had a recent decline in their functional status and demonstrates the ability to make significant improvements in function in a reasonable and predictable amount of time.  Equipment  Recommendations  None recommended by OT    Recommendations for Other Services       Precautions / Restrictions Precautions Precautions: Fall Restrictions Weight Bearing Restrictions: No      Mobility Bed Mobility                    Transfers                          Balance Overall balance assessment: Needs assistance Sitting-balance support: No upper extremity supported;Feet supported Sitting balance-Leahy Scale: Fair     Standing balance support: During functional activity;Reliant on assistive device for balance                               ADL either performed or assessed with clinical judgement   ADL Overall ADL's : Needs assistance/impaired Eating/Feeding: Set up;Sitting   Grooming: Standing;Min guard;Wash/dry face;Wash/dry Nurse, mental health Details (indicate cue type and reason): stood at sink to wash face and hands Upper Body Bathing: Moderate assistance;Sitting   Lower Body Bathing: Maximal assistance;Sit to/from stand   Upper Body Dressing : Moderate assistance;Sitting   Lower Body Dressing: Sit to/from stand;Total assistance   Toilet Transfer: Minimal assistance;Comfort height toilet;Ambulation;Rolling walker (2 wheels) Toilet Transfer Details (indicate cue type and reason): min assist to power up from low height Toileting- Clothing Manipulation and Hygiene: Maximal assistance;Sit to/from stand       Functional mobility during ADLs: Minimal assistance General ADL Comments: Min assist to stand and min guard with RW to ambulate in room.     Vision   Vision Assessment?: No apparent visual deficits     Perception  Praxis      Pertinent Vitals/Pain Pain Assessment: No/denies pain     Hand Dominance     Extremity/Trunk Assessment Upper Extremity Assessment Upper Extremity Assessment: RUE deficits/detail;LUE deficits/detail RUE Deficits / Details: WFL ROM RUE Coordination: decreased fine motor;decreased gross  motor (tremor) LUE Deficits / Details: WFL ROM LUE Coordination: decreased gross motor;decreased fine motor (tremor)   Lower Extremity Assessment Lower Extremity Assessment: Defer to PT evaluation   Cervical / Trunk Assessment Cervical / Trunk Assessment: Kyphotic   Communication Communication Communication: Expressive difficulties (very soft voice - hard to understand)   Cognition Arousal/Alertness: Awake/alert Behavior During Therapy: WFL for tasks assessed/performed Overall Cognitive Status: Within Functional Limits for tasks assessed                                       General Comments       Exercises     Shoulder Instructions      Home Living Family/patient expects to be discharged to:: Private residence Living Arrangements: Children Available Help at Discharge: Family;Available 24 hours/day Type of Home: House                       Home Equipment: Conservation officer, nature (2 wheels);Cane - quad;Wheelchair - manual;Hospital bed          Prior Functioning/Environment Prior Level of Function : Needs assist       Physical Assist : Mobility (physical) Mobility (physical): Gait   Mobility Comments: Daughter is "sometimes" with her when she walks ADLs Comments: daughter assists with all ADLs        OT Problem List: Decreased strength;Decreased activity tolerance;Impaired balance (sitting and/or standing);Decreased coordination;Impaired tone;Impaired UE functional use      OT Treatment/Interventions: Self-care/ADL training;Therapeutic exercise;DME and/or AE instruction;Therapeutic activities;Balance training;Patient/family education    OT Goals(Current goals can be found in the care plan section) Acute Rehab OT Goals Patient Stated Goal: to get stronger OT Goal Formulation: With patient Time For Goal Achievement: 03/05/21 Potential to Achieve Goals: Fair  OT Frequency: Min 2X/week   Barriers to D/C:            Co-evaluation  PT/OT/SLP Co-Evaluation/Treatment: Yes (co-eval)            AM-PAC OT "6 Clicks" Daily Activity     Outcome Measure Help from another person eating meals?: A Little Help from another person taking care of personal grooming?: A Little Help from another person toileting, which includes using toliet, bedpan, or urinal?: A Lot Help from another person bathing (including washing, rinsing, drying)?: A Lot Help from another person to put on and taking off regular upper body clothing?: A Lot Help from another person to put on and taking off regular lower body clothing?: Total 6 Click Score: 13   End of Session Equipment Utilized During Treatment: Gait belt;Rolling walker (2 wheels) Nurse Communication:  (okay to see)  Activity Tolerance: Patient limited by fatigue Patient left: in chair;with call bell/phone within reach  OT Visit Diagnosis: Muscle weakness (generalized) (M62.81);Other symptoms and signs involving the nervous system (R29.898)                Time: 7591-6384 OT Time Calculation (min): 32 min Charges:  OT General Charges $OT Visit: 1 Visit OT Evaluation $OT Eval Moderate Complexity: 1 Mod  Kahlan Engebretson, OTR/L [place]  Office [phone] Pager: [phone]   Derl Barrow L  Talha Iser 02/19/2021, 12:10 PM

## 2021-02-19 NOTE — TOC Initial Note (Signed)
Transition of Care [facility]) - Initial/Assessment Note    Patient Details  Name: Brianna Pittman MRN: [id number] Date of Birth: 18-Nov-1940  Transition of Care [facility]) CM/SW Contact:    Leeroy Cha, RN Phone Number: 02/19/2021, 1:01 PM  Clinical Narrative:                  Transition of Care [facility]) Screening Note   Patient Details  Name: Brianna Pittman Date of Birth: 22-Mar-1940   Transition of Care [facility]) CM/SW Contact:    Leeroy Cha, RN Phone Number: 02/19/2021, 1:01 PM    Transition of Care Department [facility]) has reviewed patient and no TOC needs have been identified at this time. We will continue to monitor patient advancement through interdisciplinary progression rounds. If new patient transition needs arise, please place a TOC consult.    Expected Discharge Plan: [place] Barriers to Discharge: No Barriers Identified   Patient Goals and CMS Choice Patient states their goals for this hospitalization and ongoing recovery are:: to go home CMS Medicare.gov Compare Post Acute Care list provided to:: Patient Choice offered to / list presented to : Patient  Expected Discharge Plan and Services Expected Discharge Plan: [place]   Discharge Planning Services: CM Consult Post Acute Care Choice: [place] arrangements for the past 2 months: Single Family Home Expected Discharge Date: 02/19/21                         HH Arranged: PT, OT HH Agency: [place] Date [place]: 02/19/21 Time HH Agency Contacted: 1301 Representative spoke with at [place] Arrangements/Services Living arrangements for the past 2 months: [place] with:: Self Patient language and need for interpreter reviewed:: Yes Do you feel safe going back to the place where you live?: Yes            Criminal Activity/Legal Involvement Pertinent to Current Situation/Hospitalization: No  - Comment as needed  Activities of Daily Living Home Assistive Devices/Equipment: Wheelchair ADL Screening (condition at time of admission) Patient's cognitive ability adequate to safely complete daily activities?: No Is the patient deaf or have difficulty hearing?: No Does the patient have difficulty seeing, even when wearing glasses/contacts?: No Does the patient have difficulty concentrating, remembering, or making decisions?: Yes Patient able to express need for assistance with ADLs?: Yes Does the patient have difficulty dressing or bathing?: Yes Independently performs ADLs?: No Communication: Independent Dressing (OT): Needs assistance Is this a change from baseline?: Pre-admission baseline Grooming: Needs assistance Is this a change from baseline?: Pre-admission baseline Feeding: Needs assistance Is this a change from baseline?: Pre-admission baseline Bathing: Needs assistance Is this a change from baseline?: Pre-admission baseline Toileting: Needs assistance Is this a change from baseline?: Pre-admission baseline In/Out Bed: Needs assistance Is this a change from baseline?: Pre-admission baseline Walks in Home: Needs assistance Is this a change from baseline?: Pre-admission baseline Does the patient have difficulty walking or climbing stairs?: Yes Weakness of Legs: Both Weakness of Arms/Hands: None  Permission Sought/Granted                  Emotional Assessment Appearance:: Appears stated age Attitude/Demeanor/Rapport: Engaged Affect (typically observed): Calm Orientation: : Oriented to  Time, Oriented to Place, Oriented to Self, Oriented to Situation Alcohol / Substance Use: Not Applicable Psych Involvement: No (comment)  Admission diagnosis:  Speech abnormality [R47.9] Speech disturbance, unspecified  type [R47.9] Patient Active Problem List   Diagnosis Date Noted   Stroke-like symptom 02/18/2021   Speech abnormality 02/18/2021   Low back pain 08/14/2020    Multiple myeloma ([place]) 10/24/2017   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   Spinal stenosis in cervical region 09/24/2014   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   Dysphagia, pharyngoesophageal phase 08/06/2014   PAC (premature atrial contraction) 06/09/2014   Tremor 05/21/2014   Nonspecific abnormal electrocardiogram (ECG) (EKG) 04/09/2014   Routine general medical examination at a health care facility 04/09/2014   Other screening mammogram 09/17/2013   Constipation 06/11/2013   OAB (overactive bladder) 06/11/2013   GERD (gastroesophageal reflux disease) 03/21/2012   MGUS (monoclonal gammopathy of unknown significance) 11/24/2011   Health care maintenance 09/12/2011   Vitamin D deficiency 08/10/2008   Osteoarthritis 03/09/2006   Hyperlipidemia with target LDL less than 130 02/06/2006   Essential hypertension 02/06/2006   PCP:  Janith Lima, MD Pharmacy:   [facility] [address], [address] [place] [address] Phone: [phone] Fax: [phone]  [place] [address] Phone: [phone] Fax: [phone]     Social Determinants of Health (SDOH) Interventions    Readmission Risk Interventions No flowsheet data found.

## 2021-02-19 NOTE — Plan of Care (Signed)
°  Problem: Health Behavior/Discharge Planning: Goal: Ability to manage health-related needs will improve Outcome: Progressing   Problem: Nutrition: Goal: Adequate nutrition will be maintained Outcome: Progressing   Problem: Safety: Goal: Ability to remain free from injury will improve Outcome: Progressing   Problem: Education: Goal: Knowledge of secondary prevention will improve (SELECT ALL) Outcome: Progressing

## 2021-02-22 ENCOUNTER — Telehealth: Payer: Self-pay

## 2021-02-22 NOTE — Telephone Encounter (Addendum)
Transition Care Management Unsuccessful Follow-up Telephone Call  Date of discharge and from where:  [place] 02-19-21 Dx: stroke like symptoms  Attempts:  1st Attempt  Reason for unsuccessful TCM follow-up call:  Left voice message  Transition Care Management Follow-up Telephone Call Date of discharge and from where: [place] 02-19-21 Dx: stroke like symptoms How have you been since you were released from the hospital? Feeling better  Any questions or concerns? No  Items Reviewed: Did the pt receive and understand the discharge instructions provided? Yes  Medications obtained and verified? Yes  Other? No  Any new allergies since your discharge? No  Dietary orders reviewed? Yes Do you have support at home? Yes   Home Care and Equipment/Supplies: Were home health services ordered? yes If so, what is the name of the agency? Unknown   Has the agency set up a time to come to the patient's home? no Were any new equipment or medical supplies ordered?  No What is the name of the medical supply agency? na Were you able to get the supplies/equipment? not applicable Do you have any questions related to the use of the equipment or supplies? No  Functional Questionnaire: (I = Independent and D = Dependent) ADLs: D  Bathing/Dressing- D  Meal Prep- D  Eating- I  Maintaining continence- D  Transferring/Ambulation- D  Managing Meds- D  Follow up appointments reviewed:  PCP Hospital f/u appt confirmed? Yes  Scheduled to see Jeralyn Ruths on 02-25-21 @ 1040am. [place] [facility] f/u appt confirmed? No . Are transportation arrangements needed? No  If their condition worsens, is the pt aware to call PCP or go to the Emergency Dept.? Yes Was the patient provided with contact information for the PCP's office or ED? Yes Was to pt encouraged to call back with questions or concerns? Yes

## 2021-02-25 ENCOUNTER — Other Ambulatory Visit: Payer: Self-pay

## 2021-02-25 ENCOUNTER — Ambulatory Visit (INDEPENDENT_AMBULATORY_CARE_PROVIDER_SITE_OTHER): Payer: Medicare (Managed Care) | Admitting: Nurse Practitioner

## 2021-02-25 VITALS — BP 120/66 | HR 72 | Temp 98.6°F | Ht 64.0 in

## 2021-02-25 DIAGNOSIS — I10 Essential (primary) hypertension: Secondary | ICD-10-CM | POA: Diagnosis not present

## 2021-02-25 DIAGNOSIS — M159 Polyosteoarthritis, unspecified: Secondary | ICD-10-CM | POA: Diagnosis not present

## 2021-02-25 DIAGNOSIS — M150 Primary generalized (osteo)arthritis: Secondary | ICD-10-CM

## 2021-02-25 LAB — COMPREHENSIVE METABOLIC PANEL
ALT: 5 U/L (ref 0–35)
AST: 12 U/L (ref 0–37)
Albumin: 3.4 g/dL — ABNORMAL LOW (ref 3.5–5.2)
Alkaline Phosphatase: 49 U/L (ref 39–117)
BUN: 22 mg/dL (ref 6–23)
CO2: 25 mEq/L (ref 19–32)
Calcium: 8.6 mg/dL (ref 8.4–10.5)
Chloride: 105 mEq/L (ref 96–112)
Creatinine, Ser: 0.94 mg/dL (ref 0.40–1.20)
GFR: 57.66 mL/min — ABNORMAL LOW (ref >60.00)
Glucose, Bld: 115 mg/dL — ABNORMAL HIGH (ref 70–99)
Potassium: 4.1 mEq/L (ref 3.5–5.1)
Sodium: 138 mEq/L (ref 135–145)
Total Bilirubin: 0.5 mg/dL (ref 0.2–1.2)
Total Protein: 7.3 g/dL (ref 6.0–8.3)

## 2021-02-25 LAB — CBC WITH DIFFERENTIAL/PLATELET
Basophils Absolute: 0 10*3/uL (ref 0.0–0.1)
Basophils Relative: 0.3 % (ref 0.0–3.0)
Eosinophils Absolute: 0.3 10*3/uL (ref 0.0–0.7)
Eosinophils Relative: 10.7 % — ABNORMAL HIGH (ref 0.0–5.0)
HCT: 30.7 % — ABNORMAL LOW (ref 36.0–46.0)
Hemoglobin: 10.2 g/dL — ABNORMAL LOW (ref 12.0–15.0)
Lymphocytes Relative: 31.7 % (ref 12.0–46.0)
Lymphs Abs: 0.9 10*3/uL (ref 0.7–4.0)
MCHC: 33.2 g/dL (ref 30.0–36.0)
MCV: 93.6 fl (ref 78.0–100.0)
Monocytes Absolute: 0.3 10*3/uL (ref 0.1–1.0)
Monocytes Relative: 9.6 % (ref 3.0–12.0)
Neutro Abs: 1.4 10*3/uL (ref 1.4–7.7)
Neutrophils Relative %: 47.7 % (ref 43.0–77.0)
Platelets: 178 10*3/uL (ref 150.0–400.0)
RBC: 3.28 Mil/uL — ABNORMAL LOW (ref 3.87–5.11)
RDW: 15.1 % (ref 11.5–15.5)
WBC: 2.9 10*3/uL — ABNORMAL LOW (ref 4.0–10.5)

## 2021-02-25 NOTE — Progress Notes (Signed)
Subjective:  Patient ID: Brianna Pittman, female    DOB: Jun 24, 1940  Age: 81 y.o. MRN: [id number]  CC:  Chief Complaint  Patient presents with   Hospitalization Follow-up      HPI  This patient arrives today for the above.  She was hospitalized on 02/18/2021 and discharged on 02/19/2021 for hypertensive urgency and strokelike symptoms.  She is accompanied today by her daughter who tells me that she brought the patient to the emergency department when after lunch the patient experienced facial droop.  During work-up while hospitalized patient had MRI which was negative for acute stroke and underwent EEG which was negative for seizure-like activity.  Patient's blood pressure was quite elevated when admitted to the hospital.  Since then daughter reports the blood pressure is much more controlled at home.  It appears patient may have been out of her antihypertensive medication at the time of hospitalization.  Patient and daughter feel that patient is doing much better.  Daughter does have question regarding treatment for arthritis pain.  Patient is no longer taking tramadol or gabapentin due to negative side effects.  Past Medical History:  Diagnosis Date   Allergy    Anemia    Anxiety    Benign neoplasm of unspecified site    Cataract    Chest pain    Colon polyps    Depression    h/o depression   Depression with anxiety    Esophageal reflux    Hyperlipidemia    Hypertension    Osteoarthrosis, unspecified whether generalized or localized, unspecified site    Ulcer    Vitamin D deficiency       Family History  Problem Relation Age of Onset   Ovarian cancer Mother    Diabetes Father    Parkinson's disease Father    Parkinson's disease Brother    Diabetes Brother        x 2   Prostate cancer Brother    Diabetes Maternal Uncle    Diabetes Maternal Grandmother    Diabetes Other    Colon cancer Neg Hx    Esophageal cancer Neg Hx    Rectal cancer Neg Hx    Stomach  cancer Neg Hx    Neurologic Disorder Neg Hx     Social History   Social History Narrative   Right Handed    Lives in a two story home    Social History   Tobacco Use   Smoking status: Never   Smokeless tobacco: Never   Tobacco comments:    been around 2nd hand smoke  Substance Use Topics   Alcohol use: No     Current Meds  Medication Sig   atorvastatin (LIPITOR) 40 MG tablet Take 1 tablet (40 mg total) by mouth at bedtime.   botulinum toxin Type A (BOTOX) 100 units SOLR injection Inject 100 units IM into the eyelids once every 90 days   carbidopa-levodopa (SINEMET IR) 25-100 MG tablet Take 2 tablets before breakfast, then 1 tablet before lunch   nebivolol (BYSTOLIC) 5 MG tablet Take 1 tablet (5 mg total) by mouth daily.   vitamin B-12 (CYANOCOBALAMIN) 500 MCG tablet Take 1 tablet (500 mcg total) by mouth daily.    ROS:  Review of Systems  Respiratory:  Negative for shortness of breath.   Cardiovascular:  Negative for chest pain.  Musculoskeletal:  Positive for joint pain.  Neurological:  Positive for speech change and weakness (chronic). Negative for seizures, loss of consciousness and  headaches.    Objective:   Today's Vitals: BP 120/66 (BP Location: Left Arm, Patient Position: Sitting, Cuff Size: Large)    Pulse 72    Temp 98.6 F (37 C) (Oral)    Ht 5\' 4"  (1.626 m)    LMP 07/24/1993    SpO2 99%    BMI 27.48 kg/m  Vitals with BMI 02/25/2021 02/19/2021 02/19/2021  Height 5\' 4"  - -  Weight - - -  BMI - - -  Systolic 599 357 017  Diastolic 66 52 61  Pulse 72 58 66     Physical Exam Vitals reviewed.  Constitutional:      General: She is not in acute distress.    Appearance: Normal appearance.  HENT:     Head: Normocephalic and atraumatic.  Neck:     Vascular: No carotid bruit.  Cardiovascular:     Rate and Rhythm: Normal rate and regular rhythm.     Pulses: Normal pulses.     Heart sounds: Normal heart sounds.  Pulmonary:     Effort: Pulmonary effort is  normal.     Breath sounds: Normal breath sounds.  Skin:    General: Skin is warm and dry.  Neurological:     General: No focal deficit present.     Mental Status: She is alert and oriented to person, place, and time.  Psychiatric:        Mood and Affect: Mood normal.        Behavior: Behavior normal.        Judgment: Judgment normal.         Assessment and Plan   1. Hypertension, unspecified type      Plan: Blood pressure controlled well on current regimen.  She continue take medication as prescribed. Will check CBC/CMP per hospitalist recommendation. 2.  I recommend she continue using over-the-counter medication such as Tylenol as needed for arthritis pain.  She was told not to exceed 3000 mg in 24-hour period.   Tests ordered Orders Placed This Encounter  Procedures   CBC with Differential/Platelet   Comprehensive metabolic panel      No orders of the defined types were placed in this encounter.   Patient to follow-up in 3 months with primary care provider or sooner as needed.  Ailene Ards, NP

## 2021-03-10 ENCOUNTER — Encounter: Payer: Self-pay | Admitting: Neurology

## 2021-03-10 ENCOUNTER — Telehealth: Payer: Self-pay | Admitting: Neurology

## 2021-03-10 NOTE — Telephone Encounter (Signed)
Patients daughter called stating they need a letter confirming Dekayla is a patient here and that she has Parkinsons.

## 2021-03-10 NOTE — Telephone Encounter (Signed)
Pt daughter stated that the letter needs to say that the pt has parkinson's and that Dr Tat is the Dr

## 2021-03-10 NOTE — Telephone Encounter (Signed)
Pt daughter called an informed letter is ready

## 2021-03-10 NOTE — Telephone Encounter (Signed)
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

## 2021-03-15 ENCOUNTER — Other Ambulatory Visit (HOSPITAL_COMMUNITY): Payer: Self-pay

## 2021-03-17 ENCOUNTER — Other Ambulatory Visit (HOSPITAL_COMMUNITY): Payer: Self-pay

## 2021-03-18 ENCOUNTER — Other Ambulatory Visit (HOSPITAL_COMMUNITY): Payer: Self-pay

## 2021-03-21 ENCOUNTER — Other Ambulatory Visit (HOSPITAL_COMMUNITY): Payer: Self-pay

## 2021-03-23 ENCOUNTER — Ambulatory Visit: Payer: Medicare (Managed Care) | Admitting: Neurology

## 2021-03-25 ENCOUNTER — Ambulatory Visit: Payer: Medicare (Managed Care) | Admitting: Neurology

## 2021-04-01 ENCOUNTER — Ambulatory Visit: Payer: Medicare (Managed Care) | Admitting: Neurology

## 2021-04-27 ENCOUNTER — Ambulatory Visit: Payer: Medicare (Managed Care) | Admitting: Neurology

## 2021-05-05 ENCOUNTER — Telehealth: Payer: Self-pay | Admitting: Neurology

## 2021-05-05 NOTE — Telephone Encounter (Signed)
Patients daughter called and stated she was having some concerns with [place].  She stated she has been wheezing a lot. She also stated a couple of days ago she felt like she couldn't breath. ?

## 2021-05-05 NOTE — Telephone Encounter (Signed)
Called patients daughter back and left voicemail message  ?

## 2021-05-19 ENCOUNTER — Ambulatory Visit: Payer: Medicare (Managed Care)

## 2021-05-19 ENCOUNTER — Telehealth: Payer: Self-pay | Admitting: Neurology

## 2021-05-19 ENCOUNTER — Encounter: Payer: Self-pay | Admitting: Nurse Practitioner

## 2021-05-19 ENCOUNTER — Ambulatory Visit (INDEPENDENT_AMBULATORY_CARE_PROVIDER_SITE_OTHER): Payer: Medicare (Managed Care) | Admitting: Nurse Practitioner

## 2021-05-19 VITALS — BP 142/92 | HR 86 | Temp 97.7°F | Ht 64.0 in | Wt 164.0 lb

## 2021-05-19 DIAGNOSIS — K0889 Other specified disorders of teeth and supporting structures: Secondary | ICD-10-CM

## 2021-05-19 DIAGNOSIS — R1310 Dysphagia, unspecified: Secondary | ICD-10-CM | POA: Diagnosis not present

## 2021-05-19 DIAGNOSIS — I10 Essential (primary) hypertension: Secondary | ICD-10-CM | POA: Diagnosis not present

## 2021-05-19 DIAGNOSIS — Z972 Presence of dental prosthetic device (complete) (partial): Secondary | ICD-10-CM | POA: Insufficient documentation

## 2021-05-19 DIAGNOSIS — E785 Hyperlipidemia, unspecified: Secondary | ICD-10-CM | POA: Diagnosis not present

## 2021-05-19 MED ORDER — SIMVASTATIN 20 MG PO TABS: 20.0000 mg | ORAL_TABLET | Freq: Every day | ORAL | 1 refills | Status: AC

## 2021-05-19 MED ORDER — AMLODIPINE BESYLATE 10 MG PO TABS: 10.0000 mg | ORAL_TABLET | Freq: Every day | ORAL | 1 refills | Status: AC

## 2021-05-19 NOTE — Assessment & Plan Note (Signed)
Chronic, simvastatin 20 mg by mouth daily refilled today. ?

## 2021-05-19 NOTE — Telephone Encounter (Signed)
Patients daughter called and stated her mother has complained some about feeling like she is not breathing, throat constricted.  ?

## 2021-05-19 NOTE — Progress Notes (Signed)
? ? ? ?Subjective:  ?Patient ID: Brianna Pittman, female    DOB: June 08, 1940  Age: 81 y.o. MRN: [id number] ? ?CC:  ?Chief Complaint  ?Patient presents with  ? Hypertension  ?  ? ? ?HPI  ?This patient arrives today for the above.  She is accompanied by her daughter. ? ?Hypertension: She was on Bystolic in the past, however was experiencing hypotension on this.  She was then changed to amlodipine and was tolerating this well but eventually ran out of the medication.  She is requesting refill today. ? ?Dysphagia: Patient's daughter reports that she has noticed the patient coughing when swallowing.  She also hears what sounds like wheezing or rattling sometimes after eating.  She is concerned the patient may need evaluation of her ability to swallow.  Patient does have Parkinson's. ? ?Hyperlipidemia: Continues on simvastatin, daughter requesting refill today. ? ?Left lower gum pain: Patient reports has been having left lower gum pain for a while.  She does wear partial dentures.  She would like to have the area evaluated. ? ?Past Medical History:  ?Diagnosis Date  ? Allergy   ? Anemia   ? Anxiety   ? Benign neoplasm of unspecified site   ? Cataract   ? Chest pain   ? Colon polyps   ? Depression   ? h/o depression  ? Depression with anxiety   ? Esophageal reflux   ? Hyperlipidemia   ? Hypertension   ? Osteoarthrosis, unspecified whether generalized or localized, unspecified site   ? Ulcer   ? Vitamin D deficiency   ? ? ? ? ?Family History  ?Problem Relation Age of Onset  ? Ovarian cancer Mother   ? Diabetes Father   ? Parkinson's disease Father   ? Parkinson's disease Brother   ? Diabetes Brother   ?     x 2  ? Prostate cancer Brother   ? Diabetes Maternal Uncle   ? Diabetes Maternal Grandmother   ? Diabetes Other   ? Colon cancer Neg Hx   ? Esophageal cancer Neg Hx   ? Rectal cancer Neg Hx   ? Stomach cancer Neg Hx   ? Neurologic Disorder Neg Hx   ? ? ?Social History  ? ?Social History Narrative  ? Right Handed   ? Lives  in a two story home   ? ?Social History  ? ?Tobacco Use  ? Smoking status: Never  ? Smokeless tobacco: Never  ? Tobacco comments:  ?  been around 2nd hand smoke  ?Substance Use Topics  ? Alcohol use: No  ? ? ? ?Current Meds  ?Medication Sig  ? amLODipine (NORVASC) 10 MG tablet Take 1 tablet (10 mg total) by mouth daily.  ? botulinum toxin Type A (BOTOX) 100 units SOLR injection Inject 100 units IM into the eyelids once every 90 days  ? carbidopa-levodopa (SINEMET IR) 25-100 MG tablet Take 2 tablets before breakfast, then 1 tablet before lunch  ? [DISCONTINUED] simvastatin (ZOCOR) 20 MG tablet SMARTSIG:1 Tablet(s) By Mouth Every Evening  ? ? ?ROS:  ?Review of Systems  ?Respiratory:  Negative for shortness of breath.   ?Cardiovascular:  Negative for chest pain.  ?Gastrointestinal:   ?     (+) Dysphagia  ? ? ?Objective:  ? ?Today's Vitals: BP (!) 142/92 (BP Location: Left Arm, Patient Position: Sitting, Cuff Size: Large)   Pulse 86   Temp 97.7 ?F (36.5 ?C) (Oral)   Ht '5\' 4"'$  (1.626 m)   Wt 164 lb (  74.4 kg)   LMP 07/24/1993   SpO2 97%   BMI 28.15 kg/m?  ? ?  05/19/2021  ?  3:15 PM 02/25/2021  ? 11:13 AM 02/19/2021  ? 12:07 PM  ?Vitals with BMI  ?Height '5\' 4"'$  '5\' 4"'$    ?Weight 164 lbs    ?BMI 28.14    ?Systolic 496 759 163  ?Diastolic 92 66 52  ?Pulse 86 72 58  ?  ? ?Physical Exam ?Vitals reviewed.  ?Constitutional:   ?   General: She is not in acute distress. ?   Appearance: Normal appearance.  ?HENT:  ?   Head: Normocephalic and atraumatic.  ?   Mouth/Throat:  ? ?   Comments: Location of patient's pain, no swelling, no lesions, no ulcerations, no masses, no drainage noted ?Neck:  ?   Vascular: No carotid bruit.  ?Cardiovascular:  ?   Rate and Rhythm: Normal rate and regular rhythm.  ?   Pulses: Normal pulses.  ?   Heart sounds: Normal heart sounds.  ?Pulmonary:  ?   Effort: Pulmonary effort is normal.  ?   Breath sounds: Normal breath sounds.  ?Skin: ?   General: Skin is warm and dry.  ?Neurological:  ?   General: No  focal deficit present.  ?   Mental Status: She is alert and oriented to person, place, and time.  ?Psychiatric:     ?   Mood and Affect: Mood normal.     ?   Behavior: Behavior normal.     ?   Judgment: Judgment normal.  ? ? ? ? ? ? ? ?Assessment and Plan  ? ?1. Hypertension, unspecified type   ?2. Hyperlipidemia with target LDL less than 130   ?3. Dysphagia, unspecified type   ?4. Ill-fitting dentures   ? ? ? ?Plan: ?See plan via problem list below. ? ? ? ?Tests ordered ?Orders Placed This Encounter  ?Procedures  ? Ambulatory referral to Home Health  ? ? ? ? ?Meds ordered this encounter  ?Medications  ? amLODipine (NORVASC) 10 MG tablet  ?  Sig: Take 1 tablet (10 mg total) by mouth daily.  ?  Dispense:  90 tablet  ?  Refill:  1  ?  Order Specific Question:   Supervising Provider  ?  Answer:   Binnie Rail [[id number]]  ? simvastatin (ZOCOR) 20 MG tablet  ?  Sig: Take 1 tablet (20 mg total) by mouth daily at 6 PM.  ?  Dispense:  90 tablet  ?  Refill:  1  ?  Order Specific Question:   Supervising Provider  ?  Answer:   Binnie Rail [[id number]]  ? ? ?Patient to follow-up in 1 month or sooner as needed. ? ?Ailene Ards, NP ? ?

## 2021-05-19 NOTE — Assessment & Plan Note (Signed)
May be related to progression of Parkinson's, daughter encouraged to continue taking carbidopa-levodopa as prescribed also will order referral to home health speech therapy for evaluation today. ?

## 2021-05-19 NOTE — Assessment & Plan Note (Signed)
No obvious etiology on gums noted.  No signs of infection or ulceration/sores noted.  I think her discomfort is related to her dentures rubbing against the gums while eating.  She was encouraged to try denture glue-like Poligrip to assist with seal on dentures and see if this helps.  Encouraged to call us if symptoms persist or worsen. ?

## 2021-05-19 NOTE — Assessment & Plan Note (Signed)
Chronic, amlodipine '10mg'$  by mouth daily reordered today.  Patient to follow-up in about 1 month for close monitoring of blood pressure.  Patient's daughter also feels comfortable checking blood pressure at home on their blood pressure device.  She was encouraged to call us with any concerns prior to her next appointment. ?

## 2021-05-19 NOTE — Telephone Encounter (Signed)
Called patients daughter back and left another voicemail that the recommendations are the same as on 05-05-21 when I called  ?

## 2021-05-20 ENCOUNTER — Ambulatory Visit: Payer: Medicare (Managed Care)

## 2021-06-24 ENCOUNTER — Ambulatory Visit: Payer: Medicare (Managed Care) | Admitting: Neurology

## 2021-07-01 ENCOUNTER — Other Ambulatory Visit (HOSPITAL_COMMUNITY): Payer: Self-pay

## 2021-07-01 ENCOUNTER — Encounter: Payer: Self-pay | Admitting: Neurology

## 2021-07-01 ENCOUNTER — Telehealth: Payer: Self-pay

## 2021-07-01 ENCOUNTER — Telehealth: Payer: Self-pay | Admitting: Neurology

## 2021-07-01 ENCOUNTER — Ambulatory Visit: Payer: Medicare (Managed Care) | Admitting: Neurology

## 2021-07-01 DIAGNOSIS — Z029 Encounter for administrative examinations, unspecified: Secondary | ICD-10-CM

## 2021-07-01 NOTE — Telephone Encounter (Signed)
Patient's daughter Rosann Auerbach called and left a voice mail stating the patient will not be coming to her appointment for Botox today due to wheezing from PD. ? ?I returned the call and Rosann Auerbach said she will call back to reschedule. ? ?She'd like a call from clinical staff to get some advice on her mom. ? ?

## 2021-07-01 NOTE — Telephone Encounter (Signed)
Called [facility] about patients Botox they have reccommended that we dispose of the Botox as it has been to long since originally ordered and it can not be reversed. Patient did not pay any Co Pay and insurance has long ago paid for the Botox  ?

## 2021-07-01 NOTE — Telephone Encounter (Signed)
Called daughter and gave Dr. Arturo Morton recommendations. Patients daughter said they did reach out to the PCP and were told it is her Parkinson's Disease I did tell her that Dr. Carles Collet does not feel that is the case and we have not seen patient since her first visit here last year.  ?

## 2021-09-19 ENCOUNTER — Telehealth: Payer: Self-pay | Admitting: Internal Medicine

## 2021-09-19 NOTE — Telephone Encounter (Signed)
LVM for pt to rtn my call to schedule AWV with [facility] call back # [phone] 

## 2021-10-14 ENCOUNTER — Ambulatory Visit: Payer: Medicare (Managed Care) | Admitting: Neurology

## 2022-02-20 DEATH — deceased

## 2022-06-20 ENCOUNTER — Telehealth: Payer: Self-pay

## 2022-06-20 NOTE — Telephone Encounter (Signed)
Called patient to schedule Medicare Annual Wellness Visit (AWV). Unable to reach patient.  Last date of AWV: 04/09/14  Please schedule an appointment at any time On Annual Wellness Visit Schedule.
